# Patient Record
Sex: Female | Born: 1965 | Race: White | Hispanic: No | Marital: Married | Smoking: Heavy tobacco smoker
Health system: Southern US, Community
[De-identification: ages and names within clinical notes are randomized; demographics above are authoritative.]

## PROBLEM LIST (undated history)

## (undated) DIAGNOSIS — R0600 Dyspnea, unspecified: Secondary | ICD-10-CM

## (undated) DIAGNOSIS — F32A Depression, unspecified: Secondary | ICD-10-CM

## (undated) DIAGNOSIS — R911 Solitary pulmonary nodule: Secondary | ICD-10-CM

## (undated) DIAGNOSIS — I10 Essential (primary) hypertension: Secondary | ICD-10-CM

## (undated) DIAGNOSIS — S2239XA Fracture of one rib, unspecified side, initial encounter for closed fracture: Secondary | ICD-10-CM

## (undated) DIAGNOSIS — M1990 Unspecified osteoarthritis, unspecified site: Secondary | ICD-10-CM

## (undated) DIAGNOSIS — F419 Anxiety disorder, unspecified: Secondary | ICD-10-CM

## (undated) DIAGNOSIS — F329 Major depressive disorder, single episode, unspecified: Secondary | ICD-10-CM

## (undated) HISTORY — PX: HERNIA REPAIR: SHX51

## (undated) HISTORY — PX: APPENDECTOMY: SHX54

---

## 2003-04-09 HISTORY — PX: HERNIA REPAIR: SHX51

## 2006-11-02 ENCOUNTER — Emergency Department: Payer: Self-pay | Admitting: Emergency Medicine

## 2013-03-15 ENCOUNTER — Ambulatory Visit: Payer: Self-pay | Admitting: Nurse Practitioner

## 2013-03-15 IMAGING — CT CT CHEST-ABD-PELV W/O CM
2 of 4 series · 14 of 36 positions shown, 16 images · non-contrast
Comparison: None.

CLINICAL DATA: Right lower lobe pulmonary nodule follow-up. No
comparison available.

EXAM:
CT CHEST, ABDOMEN AND PELVIS WITHOUT CONTRAST
TECHNIQUE: Multidetector CT imaging of the chest, abdomen and pelvis was
performed following the standard protocol without IV contrast.

[Series 2: cap wo · axial · 0.61mm/px · z∈[-70,+470]mm · 11 of 122 slices shown, 13 images]
[im 7/122  mediastinal]
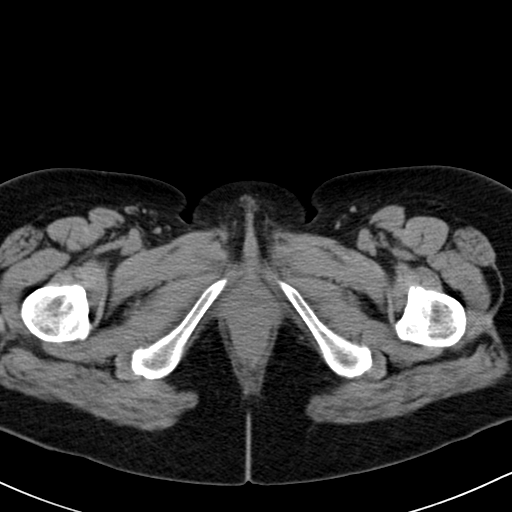
[im 7/122  bone]
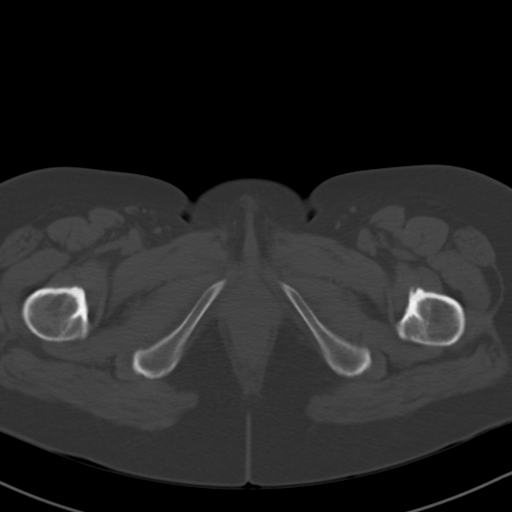
[im 21/122  mediastinal]
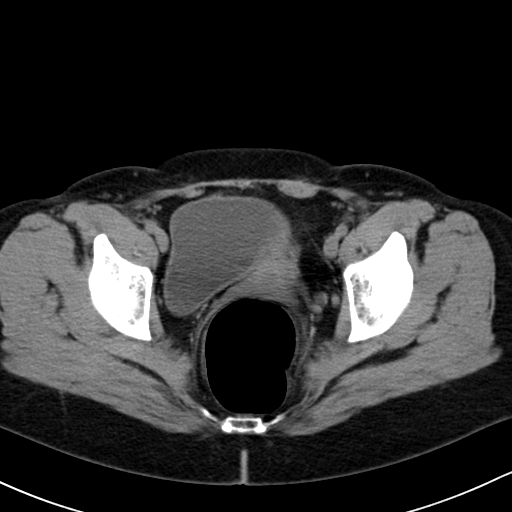
[im 27/122  mediastinal]
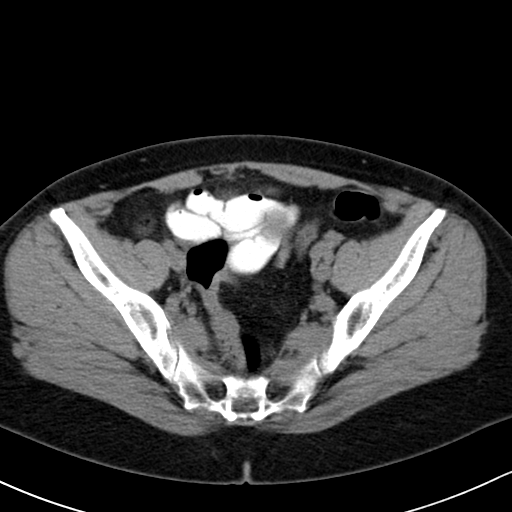
[im 41/122  mediastinal]
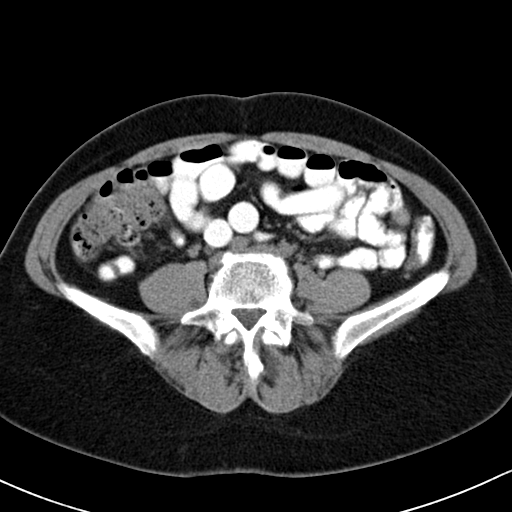
[im 48/122  mediastinal]
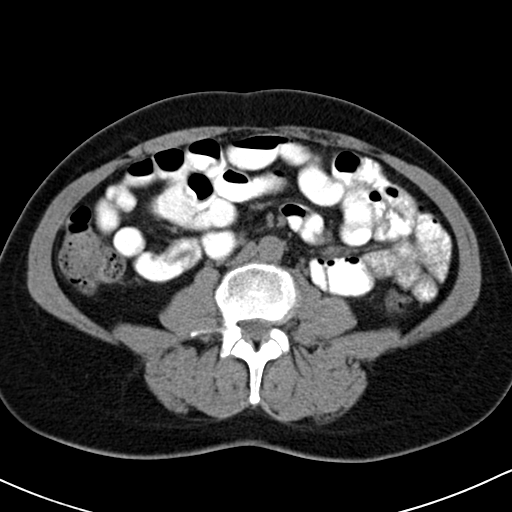
[im 61/122  mediastinal]
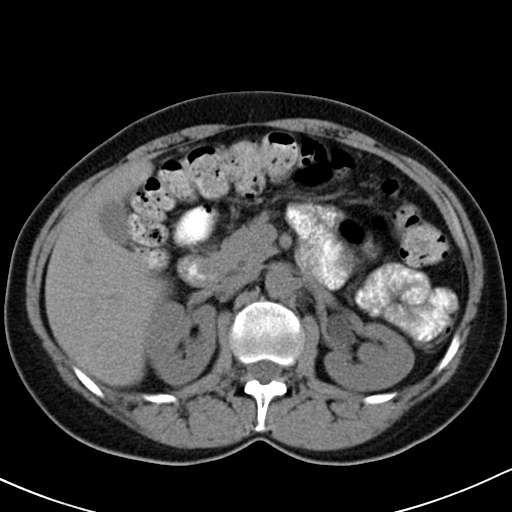
[im 74/122  mediastinal]
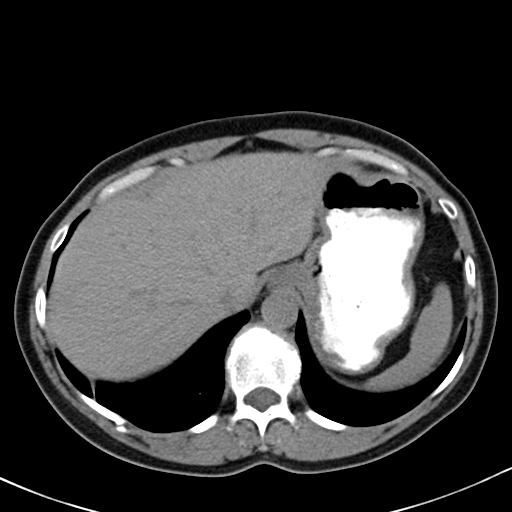
[im 81/122  mediastinal]
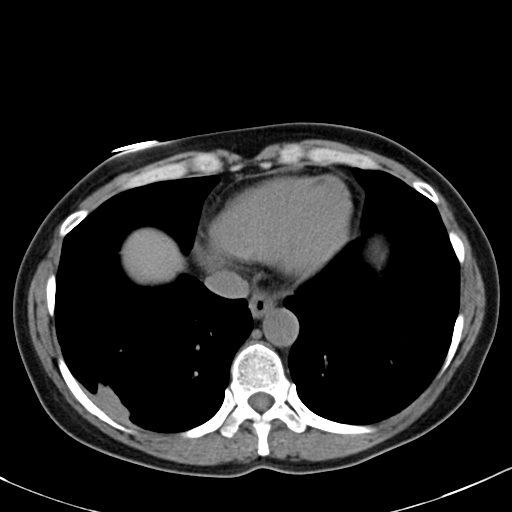
[im 95/122  mediastinal]
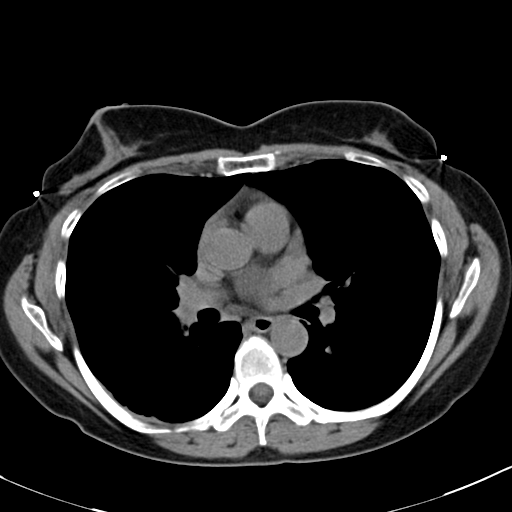
[im 95/122  bone]
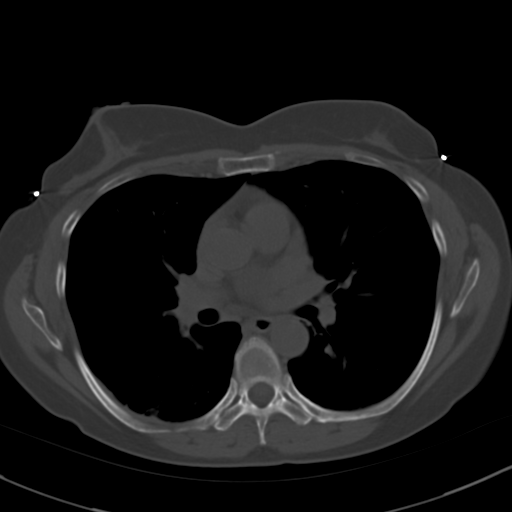
[im 101/122  mediastinal]
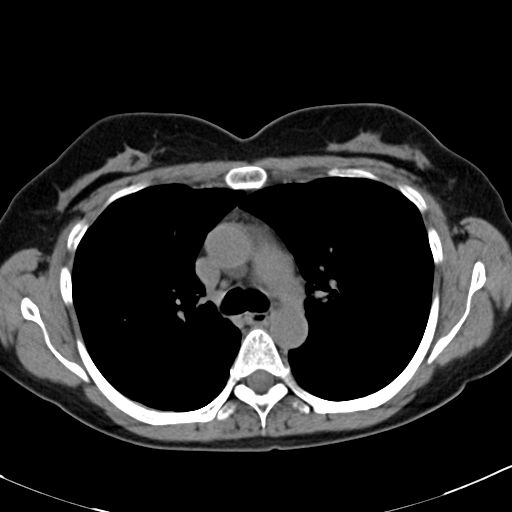
[im 115/122  mediastinal]
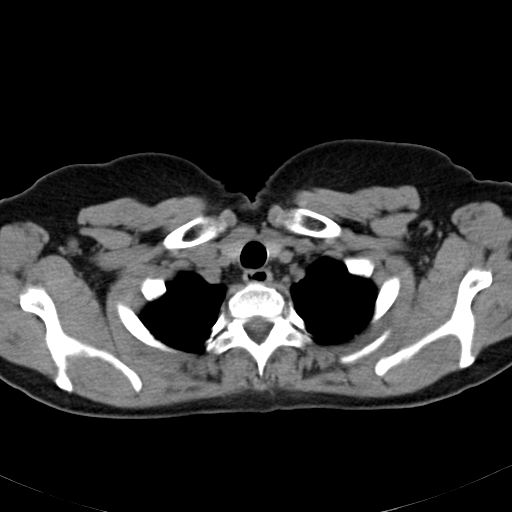

[Series 5: cor cap wo · coronal · 0.65mm/px · 3 of 114 slices shown]
[im 23/114  mediastinal]
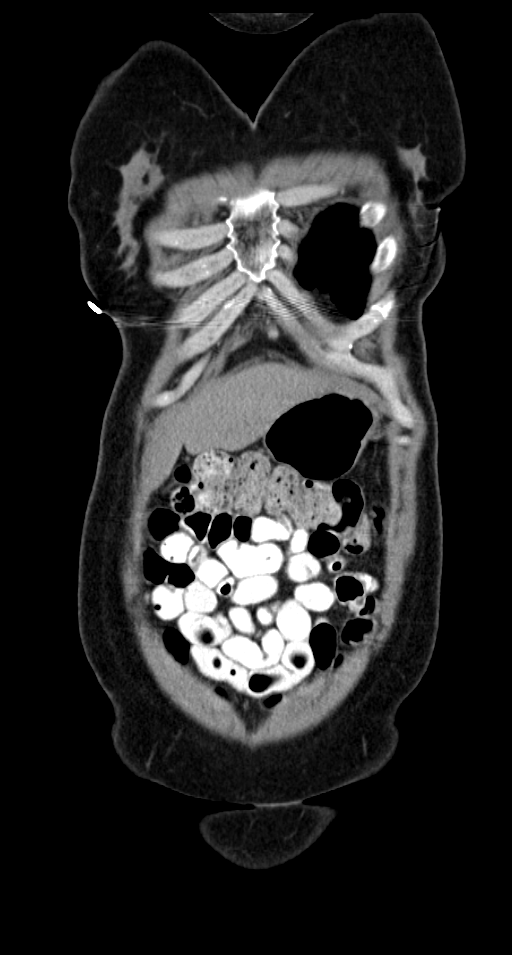
[im 46/114  mediastinal]
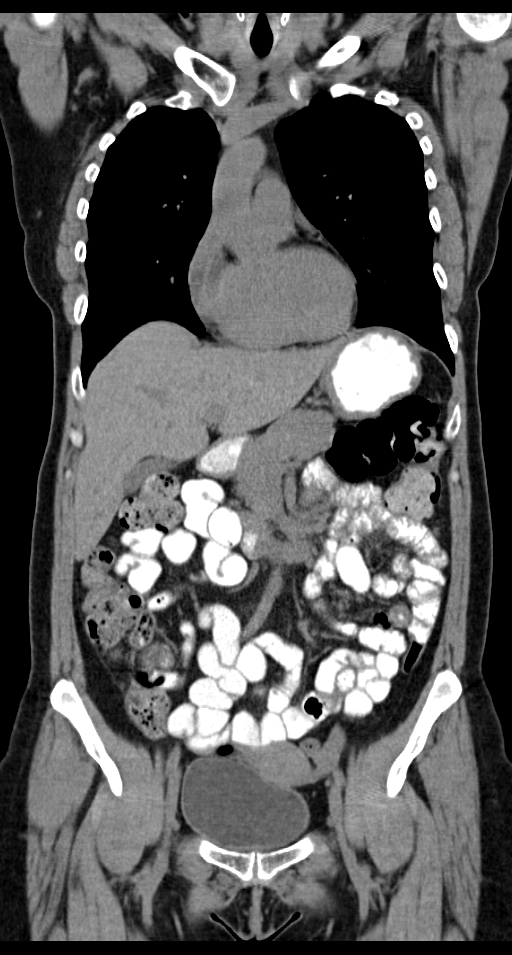
[im 68/114  mediastinal]
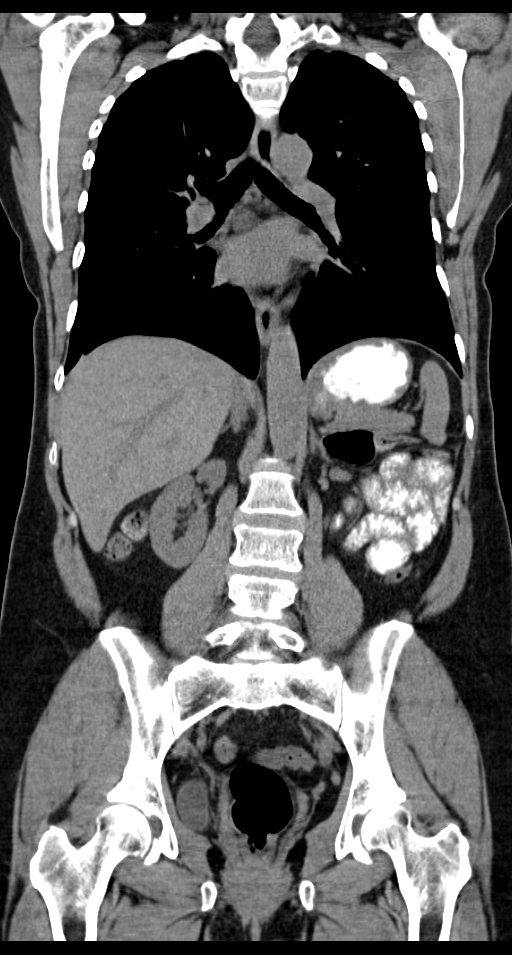

[14 of 36 positions shown; findings below may reference images not displayed]

FINDINGS: CT CHEST FINDINGS

Within the periphery of the right lateral lower lobe there is
nodular pleural parenchymal thickening measuring 2.7 x 1.0 cm. The
lesion has arc - like parenchymal bands suggesting a benign
atelectasis. Within the left upper lobe there is a flattened 6
density with which on the coronal projection is found to represent a
vessel. .

There is no axillary or supraclavicular adenopathy. No mediastinal
hilar adenopathy on this non contrast exam.

CT ABDOMEN AND PELVIS FINDINGS

No focal hepatic lesion on this noncontrast exam. Adrenal glands are
normal. The is gallbladder, pancreas, spleen, kidneys are normal.
Stomach, small bowel and cecum are normal. The colon and
rectosigmoid colon are normal.

Abdominal aorta is normal caliber. No retroperitoneal periportal
lymphadenopathy.

Uterus and bladder normal. No pelvic lymphadenopathy. No aggressive
osseous lesion.
IMPRESSION: The nodular pleural-parenchymal thickening in the right lower lobe
is most suggestive of atelectasis. No comparison available.
Depending on patient's risk factors for bronchogenic carcinoma an
the length time of prior comparison consider followup CT in 3 to 6
months. If patient is low risk no follow-up may be needed.

## 2013-03-19 ENCOUNTER — Ambulatory Visit: Payer: Self-pay | Admitting: Nurse Practitioner

## 2014-04-06 ENCOUNTER — Ambulatory Visit: Payer: Self-pay | Admitting: Nurse Practitioner

## 2016-01-12 ENCOUNTER — Encounter: Payer: Self-pay | Admitting: *Deleted

## 2016-01-15 ENCOUNTER — Encounter: Payer: Self-pay | Admitting: *Deleted

## 2016-01-15 ENCOUNTER — Ambulatory Visit: Admitting: Anesthesiology

## 2016-01-15 ENCOUNTER — Ambulatory Visit
Admission: RE | Admit: 2016-01-15 | Discharge: 2016-01-15 | Disposition: A | Discharge status: Home / Self Care | Source: Ambulatory Visit | Attending: Unknown Physician Specialty | Admitting: Unknown Physician Specialty

## 2016-01-15 ENCOUNTER — Encounter
Admission: RE | Disposition: A | Discharge status: Home / Self Care | Payer: Self-pay | Source: Ambulatory Visit | Attending: Unknown Physician Specialty

## 2016-01-15 DIAGNOSIS — F419 Anxiety disorder, unspecified: Secondary | ICD-10-CM | POA: Insufficient documentation

## 2016-01-15 DIAGNOSIS — Z79899 Other long term (current) drug therapy: Secondary | ICD-10-CM | POA: Diagnosis not present

## 2016-01-15 DIAGNOSIS — Z1211 Encounter for screening for malignant neoplasm of colon: Secondary | ICD-10-CM | POA: Diagnosis not present

## 2016-01-15 DIAGNOSIS — F329 Major depressive disorder, single episode, unspecified: Secondary | ICD-10-CM | POA: Diagnosis not present

## 2016-01-15 DIAGNOSIS — I252 Old myocardial infarction: Secondary | ICD-10-CM | POA: Insufficient documentation

## 2016-01-15 DIAGNOSIS — F17210 Nicotine dependence, cigarettes, uncomplicated: Secondary | ICD-10-CM | POA: Diagnosis not present

## 2016-01-15 DIAGNOSIS — K635 Polyp of colon: Secondary | ICD-10-CM | POA: Diagnosis not present

## 2016-01-15 DIAGNOSIS — K640 First degree hemorrhoids: Secondary | ICD-10-CM | POA: Diagnosis not present

## 2016-01-15 DIAGNOSIS — M069 Rheumatoid arthritis, unspecified: Secondary | ICD-10-CM | POA: Insufficient documentation

## 2016-01-15 HISTORY — DX: Solitary pulmonary nodule: R91.1

## 2016-01-15 HISTORY — DX: Unspecified osteoarthritis, unspecified site: M19.90

## 2016-01-15 HISTORY — PX: COLONOSCOPY WITH PROPOFOL: SHX5780

## 2016-01-15 HISTORY — DX: Depression, unspecified: F32.A

## 2016-01-15 HISTORY — DX: Major depressive disorder, single episode, unspecified: F32.9

## 2016-01-15 HISTORY — DX: Fracture of one rib, unspecified side, initial encounter for closed fracture: S22.39XA

## 2016-01-15 HISTORY — DX: Anxiety disorder, unspecified: F41.9

## 2016-01-15 SURGERY — COLONOSCOPY WITH PROPOFOL
Anesthesia: General

## 2016-01-15 MED ORDER — SODIUM CHLORIDE 0.9 % IV SOLN
INTRAVENOUS | Status: DC
  Administered 2016-01-15: 1000 mL via INTRAVENOUS

## 2016-01-15 MED ORDER — SODIUM CHLORIDE 0.9 % IV SOLN: INTRAVENOUS | Status: DC

## 2016-01-15 MED ORDER — PROPOFOL 500 MG/50ML IV EMUL
INTRAVENOUS | Status: DC | PRN
  Administered 2016-01-15: 140 ug/kg/min via INTRAVENOUS

## 2016-01-15 MED ORDER — LIDOCAINE HCL (CARDIAC) 20 MG/ML IV SOLN
INTRAVENOUS | Status: DC | PRN
  Administered 2016-01-15: 30 mg via INTRAVENOUS

## 2016-01-15 MED ORDER — PROPOFOL 10 MG/ML IV BOLUS
INTRAVENOUS | Status: DC | PRN
  Administered 2016-01-15: 20 mg via INTRAVENOUS

## 2016-01-15 MED ORDER — MIDAZOLAM HCL 2 MG/2ML IJ SOLN
INTRAMUSCULAR | Status: DC | PRN
  Administered 2016-01-15: 1 mg via INTRAVENOUS

## 2016-01-15 NOTE — Anesthesia Postprocedure Evaluation (Signed)
Anesthesia Post Note  Patient: Jillian Ward  Procedure(s) Performed: Procedure(s) (LRB): COLONOSCOPY WITH PROPOFOL (N/A)  Patient location during evaluation: Endoscopy Anesthesia Type: General Level of consciousness: awake and alert Pain management: pain level controlled Vital Signs Assessment: post-procedure vital signs reviewed and stable Respiratory status: spontaneous breathing and respiratory function stable Cardiovascular status: stable Anesthetic complications: no    Last Vitals:  Vitals:   01/15/16 1524 01/15/16 1534  BP: 101/72 112/82  Pulse: 72 70  Resp: 13 15  Temp: 36.1 C     Last Pain:  Vitals:   01/15/16 1524  TempSrc: Tympanic                 Nickholas Goldston K

## 2016-01-15 NOTE — Transfer of Care (Signed)
Immediate Anesthesia Transfer of Care Note  Patient: Jillian Ward  Procedure(s) Performed: Procedure(s): COLONOSCOPY WITH PROPOFOL (N/A)  Patient Location: PACU  Anesthesia Type:General  Level of Consciousness: awake and alert   Airway & Oxygen Therapy: Patient Spontanous Breathing and Patient connected to nasal cannula oxygen  Post-op Assessment: Report given to RN and Post -op Vital signs reviewed and stable  Post vital signs: Reviewed and stable  Last Vitals:  Vitals:   01/15/16 1359 01/15/16 1523  BP: 137/89 101/72  Pulse: 85 71  Resp: 18 14  Temp: 37 C 36.1 C    Last Pain:  Vitals:   01/15/16 1523  TempSrc: Tympanic         Complications: No apparent anesthesia complications

## 2016-01-15 NOTE — Op Note (Signed)
[facility]astroenterology Patient Name: Jillian Ward Procedure Date: 01/15/2016 2:56 PM MRN: [id number] Account #: [id number] Date of Birth: Aug 23, 1965 Admit Type: Outpatient Age: 50 Room: [facility] ENDO ROOM 3 Gender: Female Note Status: Finalized Procedure:            Colonoscopy Indications:          Screening for colorectal malignant neoplasm Providers:            Scot Jun, MD Referring MD:         Saul Fordyce, MD Medicines:            Propofol per Anesthesia Complications:        No immediate complications. Procedure:            Pre-Anesthesia Assessment:                       - After reviewing the risks and benefits, the patient                        was deemed in satisfactory condition to undergo the                        procedure.                       After obtaining informed consent, the colonoscope was                        passed under direct vision. Throughout the procedure,                        the patient's blood pressure, pulse, and oxygen                        saturations were monitored continuously. The                        Colonoscope was introduced through the anus and                        advanced to the the cecum, identified by appendiceal                        orifice and ileocecal valve. The colonoscopy was                        performed without difficulty. The patient tolerated the                        procedure well. The quality of the bowel preparation                        was excellent. Findings:      Three sessile polyps were found in the recto-sigmoid colon, distal       descending colon and hepatic flexure. The polyps were diminutive in       size. These polyps were removed with a jumbo cold forceps. Resection and       retrieval were complete.      Internal hemorrhoids were found during endoscopy. The hemorrhoids were       small and Grade I (internal hemorrhoids  that do not prolapse).      The exam  was otherwise without abnormality. Impression:           - Three diminutive polyps at the recto-sigmoid colon,                        in the distal descending colon and at the hepatic                        flexure, removed with a jumbo cold forceps. Resected                        and retrieved.                       - Internal hemorrhoids.                       - The examination was otherwise normal. Recommendation:       - Await pathology results. Scot Jun, MD 01/15/2016 3:20:32 PM This report has been signed electronically. Number of Addenda: 0 Note Initiated On: 01/15/2016 2:56 PM Scope Withdrawal Time: 0 hours 9 minutes 21 seconds  Total Procedure Duration: 0 hours 14 minutes 27 seconds       [facility]

## 2016-01-15 NOTE — Anesthesia Preprocedure Evaluation (Signed)
Anesthesia Evaluation  Patient identified by MRN, date of birth, ID band Patient awake    Reviewed: Allergy & Precautions, NPO status , Patient's Chart, lab work & pertinent test results  History of Anesthesia Complications Negative for: history of anesthetic complications  Airway Mallampati: II  TM Distance: >3 FB Neck ROM: Full    Dental no notable dental hx.    Pulmonary neg sleep apnea, neg COPD, Current Smoker,    breath sounds clear to auscultation- rhonchi (-) wheezing      Cardiovascular Exercise Tolerance: Good (-) hypertension(-) CAD and (-) Past MI  Rhythm:Regular Rate:Normal - Systolic murmurs and - Diastolic murmurs    Neuro/Psych Anxiety Depression negative neurological ROS     GI/Hepatic negative GI ROS, Neg liver ROS,   Endo/Other  negative endocrine ROSneg diabetes  Renal/GU negative Renal ROS     Musculoskeletal  (+) Arthritis , Rheumatoid disorders,    Abdominal (+) - obese,   Peds  Hematology negative hematology ROS (+)   Anesthesia Other Findings Past Medical History: No date: Anxiety No date: Arthritis     Comment: rheumatoid No date: Depression No date: Nodule of right lung No date: Rib fracture   Reproductive/Obstetrics                             Anesthesia Physical Anesthesia Plan  ASA: II  Anesthesia Plan: General   Post-op Pain Management:    Induction: Intravenous  Airway Management Planned: Natural Airway  Additional Equipment:   Intra-op Plan:   Post-operative Plan:   Informed Consent: I have reviewed the patients History and Physical, chart, labs and discussed the procedure including the risks, benefits and alternatives for the proposed anesthesia with the patient or authorized representative who has indicated his/her understanding and acceptance.   Dental advisory given  Plan Discussed with: CRNA and Anesthesiologist  Anesthesia Plan  Comments:         Anesthesia Quick Evaluation

## 2016-01-15 NOTE — Anesthesia Procedure Notes (Signed)
Date/Time: 01/15/2016 3:02 PM Performed by: Ginger Carne Pre-anesthesia Checklist: Patient identified, Emergency Drugs available, Suction available, Patient being monitored and Timeout performed Oxygen Delivery Method: Nasal cannula

## 2016-01-15 NOTE — H&P (Signed)
   Primary Care Physician:  PROVIDER NOT IN SYSTEM Primary Gastroenterologist:  Dr. Mechele Collin  Pre-Procedure History & Physical: HPI:  Jillian Ward is a 50 y.o. female is here for an colonoscopy.   Past Medical History:  Diagnosis Date  . Anxiety   . Arthritis    rheumatoid  . Depression   . Nodule of right lung   . Rib fracture     Past Surgical History:  Procedure Laterality Date  . APPENDECTOMY    . CESAREAN SECTION    . HERNIA REPAIR  2005   ABDOMINAL  . HERNIA REPAIR     UMBILICAL    Prior to Admission medications   Medication Sig Start Date End Date Taking? Authorizing Provider  Adalimumab (HUMIRA) 40 MG/0.8ML PSKT Inject 40 mg into the skin every 14 (fourteen) days.   Yes Historical Provider, MD  buPROPion (WELLBUTRIN XL) 300 MG 24 hr tablet Take 300 mg by mouth daily.   Yes Historical Provider, MD  conjugated estrogens (PREMARIN) vaginal cream Place 1 Applicatorful vaginally daily.   Yes Historical Provider, MD  FLUoxetine (PROZAC) 20 MG tablet Take 20 mg by mouth daily.   Yes Historical Provider, MD  folic acid (FOLVITE) 0.5 MG tablet Take 0.5 mg by mouth daily.   Yes Historical Provider, MD  methotrexate (RHEUMATREX) 2.5 MG tablet Take 7.5 mg by mouth 3 (three) times a week.   Yes Historical Provider, MD  naproxen (NAPROSYN) 500 MG tablet Take 500 mg by mouth 2 (two) times daily with a meal.   Yes Historical Provider, MD  traMADol (ULTRAM) 50 MG tablet Take 50 mg by mouth every 6 (six) hours as needed.   Yes Historical Provider, MD    Allergies as of 11/24/2015  . (Not on File)    History reviewed. No pertinent family history.  Social History   Social History  . Marital status: Married    Spouse name: N/A  . Number of children: N/A  . Years of education: N/A   Occupational History  . Not on file.   Social History Main Topics  . Smoking status: Heavy Tobacco Smoker    Packs/day: 1.00    Years: 30.00    Types: Cigarettes  . Smokeless tobacco:  Never Used  . Alcohol use No  . Drug use: No  . Sexual activity: Not on file   Other Topics Concern  . Not on file   Social History Narrative  . No narrative on file    Review of Systems: See HPI, otherwise negative ROS  Physical Exam: BP 137/89   Pulse 85   Temp 98.6 F (37 C) (Tympanic)   Resp 18   Ht 5\' 1"  (1.549 m)   Wt 59 kg (130 lb)   SpO2 99%   BMI 24.56 kg/m  General:   Alert,  pleasant and cooperative in NAD Head:  Normocephalic and atraumatic. Neck:  Supple; no masses or thyromegaly. Lungs:  Clear throughout to auscultation.    Heart:  Regular rate and rhythm. Abdomen:  Soft, nontender and nondistended. Normal bowel sounds, without guarding, and without rebound.   Neurologic:  Alert and  oriented x4;  grossly normal neurologically.  Impression/Plan: Jillian Ward is here for an colonoscopy to be performed for screening  Risks, benefits, limitations, and alternatives regarding  colonoscopy have been reviewed with the patient.  Questions have been answered.  All parties agreeable.   Iran Ouch, MD  01/15/2016, 2:55 PM

## 2016-01-16 ENCOUNTER — Encounter: Payer: Self-pay | Admitting: Unknown Physician Specialty

## 2016-01-17 LAB — SURGICAL PATHOLOGY

## 2019-10-21 ENCOUNTER — Other Ambulatory Visit: Payer: Self-pay | Admitting: Surgery

## 2019-10-27 ENCOUNTER — Encounter
Admission: RE | Admit: 2019-10-27 | Discharge: 2019-10-27 | Disposition: A | Discharge status: Home / Self Care | Source: Ambulatory Visit | Attending: Surgery | Admitting: Surgery

## 2019-10-27 ENCOUNTER — Other Ambulatory Visit: Payer: Self-pay

## 2019-10-27 HISTORY — DX: Essential (primary) hypertension: I10

## 2019-10-27 NOTE — Patient Instructions (Signed)
Your procedure is scheduled on: Wednesday November 03, 2019. Report to Day Surgery inside [facility] 2nd floor. To find out your arrival time please call [phone] between 1PM - 3PM on Tuesday November 02, 2019.  Remember: Instructions that are not followed completely may result in serious medical risk,  up to and including death, or upon the discretion of your surgeon and anesthesiologist your  surgery may need to be rescheduled.     _X__ 1. Do not eat food after midnight the night before your procedure.                 No gum chewing or hard candies. You may drink clear liquids up to 2 hours                 before you are scheduled to arrive for your surgery- DO not drink clear                 liquids within 2 hours of the start of your surgery.                 Clear Liquids include:  water, apple juice without pulp, clear Gatorade, G2 or                  Gatorade Zero (avoid Red/Purple/Blue), Black Coffee or Tea (Do not add                 anything to coffee or tea).  __X__2. Complete the carbohydrate drink provided to you, 2 hours before arrival.  __X__3.  On the morning of surgery brush your teeth with toothpaste and water, you                may rinse your mouth with mouthwash if you wish.  Do not swallow any toothpaste of mouthwash.     _X__ 4.  No Alcohol for 24 hours before or after surgery.   _X__ 5.  Do Not Smoke or use e-cigarettes For 24 Hours Prior to Your Surgery.                 Do not use any chewable tobacco products for at least 6 hours prior to                 Surgery.  _X__  6.  Do not use any recreational drugs (marijuana, cocaine, heroin, ecstacy, MDMA or other)                For at least one week prior to your surgery.  Combination of these drugs with anesthesia                May have life threatening results.  __X__  7. Notify your doctor if there is any change in your medical condition      (cold, fever, infections).     Do not wear  jewelry, make-up, hairpins, clips or nail polish. Do not wear lotions, powders, or perfumes. You may wear deodorant. Do not shave 48 hours prior to surgery. Men may shave face and neck. Do not bring valuables to the hospital.    [facility] is not responsible for any belongings or valuables.  Contacts, dentures or bridgework may not be worn into surgery. Leave your suitcase in the car. After surgery it may be brought to your room. For patients admitted to the hospital, discharge time is determined by your treatment team.   Patients discharged the day of surgery will not  be allowed to drive home.   Make arrangements for someone to be with you for the first 24 hours of your Same Day Discharge.    ____ Take these medicines the morning of surgery with A SIP OF WATER:    1. None  (Since you take your medications in the afternoon)  __X__ Use CHG Soap as directed on sheet  __X__ Stop Anti-inflammatories such as Ibuprofen, Aleve, Advil, naproxen, aspirin and or BC powders.   __X__ Stop supplements until after surgery.  folic acid (FOLVITE) 1 MG  __X__ Do not start any herbal supplements before your surgery.

## 2019-11-01 ENCOUNTER — Other Ambulatory Visit: Payer: Self-pay

## 2019-11-01 ENCOUNTER — Encounter
Admission: RE | Admit: 2019-11-01 | Discharge: 2019-11-01 | Disposition: A | Discharge status: Home / Self Care | Source: Ambulatory Visit | Attending: Surgery | Admitting: Surgery

## 2019-11-01 ENCOUNTER — Other Ambulatory Visit

## 2019-11-01 DIAGNOSIS — Z01818 Encounter for other preprocedural examination: Secondary | ICD-10-CM | POA: Insufficient documentation

## 2019-11-01 DIAGNOSIS — Z20822 Contact with and (suspected) exposure to covid-19: Secondary | ICD-10-CM | POA: Diagnosis not present

## 2019-11-02 LAB — SARS CORONAVIRUS 2 (TAT 6-24 HRS): SARS Coronavirus 2: NEGATIVE

## 2019-11-03 ENCOUNTER — Other Ambulatory Visit: Payer: Self-pay

## 2019-11-03 ENCOUNTER — Ambulatory Visit
Admission: RE | Admit: 2019-11-03 | Discharge: 2019-11-03 | Disposition: A | Discharge status: Home / Self Care | Attending: Surgery | Admitting: Surgery

## 2019-11-03 ENCOUNTER — Encounter: Payer: Self-pay | Admitting: Surgery

## 2019-11-03 ENCOUNTER — Ambulatory Visit

## 2019-11-03 ENCOUNTER — Encounter
Admission: RE | Disposition: A | Discharge status: Home / Self Care | Payer: Self-pay | Source: Home / Self Care | Attending: Surgery

## 2019-11-03 DIAGNOSIS — F329 Major depressive disorder, single episode, unspecified: Secondary | ICD-10-CM | POA: Diagnosis not present

## 2019-11-03 DIAGNOSIS — I10 Essential (primary) hypertension: Secondary | ICD-10-CM | POA: Insufficient documentation

## 2019-11-03 DIAGNOSIS — M720 Palmar fascial fibromatosis [Dupuytren]: Secondary | ICD-10-CM | POA: Diagnosis not present

## 2019-11-03 DIAGNOSIS — F419 Anxiety disorder, unspecified: Secondary | ICD-10-CM | POA: Diagnosis not present

## 2019-11-03 DIAGNOSIS — Z79899 Other long term (current) drug therapy: Secondary | ICD-10-CM | POA: Insufficient documentation

## 2019-11-03 DIAGNOSIS — M069 Rheumatoid arthritis, unspecified: Secondary | ICD-10-CM | POA: Diagnosis not present

## 2019-11-03 HISTORY — PX: DUPUYTREN CONTRACTURE RELEASE: SHX1478

## 2019-11-03 SURGERY — RELEASE, DUPUYTREN CONTRACTURE
Anesthesia: General | Site: Finger | Laterality: Left

## 2019-11-03 MED ORDER — ONDANSETRON HCL 4 MG PO TABS: 4.0000 mg | ORAL_TABLET | Freq: Four times a day (QID) | ORAL | Status: DC | PRN

## 2019-11-03 MED ORDER — BUPIVACAINE HCL (PF) 0.5 % IJ SOLN
INTRAMUSCULAR | Status: AC
  Filled 2019-11-03: qty 30

## 2019-11-03 MED ORDER — CLINDAMYCIN PHOSPHATE 900 MG/50ML IV SOLN
INTRAVENOUS | Status: AC
  Filled 2019-11-03: qty 50

## 2019-11-03 MED ORDER — CHLORHEXIDINE GLUCONATE 0.12 % MT SOLN
OROMUCOSAL | Status: AC
  Administered 2019-11-03: 15 mL via OROMUCOSAL
  Filled 2019-11-03: qty 15

## 2019-11-03 MED ORDER — FAMOTIDINE 20 MG PO TABS
ORAL_TABLET | ORAL | Status: AC
  Administered 2019-11-03: 20 mg via ORAL
  Filled 2019-11-03: qty 1

## 2019-11-03 MED ORDER — PROPOFOL 10 MG/ML IV BOLUS
INTRAVENOUS | Status: DC | PRN
  Administered 2019-11-03: 30 mg via INTRAVENOUS
  Administered 2019-11-03: 125 mg via INTRAVENOUS

## 2019-11-03 MED ORDER — ONDANSETRON HCL 4 MG/2ML IJ SOLN: 4.0000 mg | Freq: Once | INTRAMUSCULAR | Status: DC | PRN

## 2019-11-03 MED ORDER — FENTANYL CITRATE (PF) 100 MCG/2ML IJ SOLN
25.0000 ug | INTRAMUSCULAR | Status: DC | PRN
  Administered 2019-11-03 (×4): 25 ug via INTRAVENOUS

## 2019-11-03 MED ORDER — LACTATED RINGERS IV SOLN: INTRAVENOUS | Status: DC

## 2019-11-03 MED ORDER — LIDOCAINE HCL (PF) 2 % IJ SOLN
INTRAMUSCULAR | Status: AC
  Filled 2019-11-03: qty 10

## 2019-11-03 MED ORDER — LIDOCAINE HCL (CARDIAC) PF 100 MG/5ML IV SOSY
PREFILLED_SYRINGE | INTRAVENOUS | Status: DC | PRN
  Administered 2019-11-03: 80 mg via INTRAVENOUS

## 2019-11-03 MED ORDER — CLINDAMYCIN PHOSPHATE 900 MG/50ML IV SOLN
900.0000 mg | INTRAVENOUS | Status: AC
  Administered 2019-11-03: 900 mg via INTRAVENOUS

## 2019-11-03 MED ORDER — FENTANYL CITRATE (PF) 100 MCG/2ML IJ SOLN
INTRAMUSCULAR | Status: DC | PRN
  Administered 2019-11-03: 25 ug via INTRAVENOUS
  Administered 2019-11-03: 12.5 ug via INTRAVENOUS
  Administered 2019-11-03: 25 ug via INTRAVENOUS
  Administered 2019-11-03 (×3): 12.5 ug via INTRAVENOUS

## 2019-11-03 MED ORDER — FAMOTIDINE 20 MG PO TABS: 20.0000 mg | ORAL_TABLET | Freq: Once | ORAL | Status: AC

## 2019-11-03 MED ORDER — HYDROCODONE-ACETAMINOPHEN 5-325 MG PO TABS: 1.0000 | ORAL_TABLET | ORAL | Status: DC | PRN

## 2019-11-03 MED ORDER — BUPIVACAINE HCL (PF) 0.5 % IJ SOLN
INTRAMUSCULAR | Status: DC | PRN
  Administered 2019-11-03: 15 mL

## 2019-11-03 MED ORDER — ONDANSETRON HCL 4 MG/2ML IJ SOLN
INTRAMUSCULAR | Status: DC | PRN
  Administered 2019-11-03: 4 mg via INTRAVENOUS

## 2019-11-03 MED ORDER — MIDAZOLAM HCL 2 MG/2ML IJ SOLN
INTRAMUSCULAR | Status: AC
  Filled 2019-11-03: qty 2

## 2019-11-03 MED ORDER — METOCLOPRAMIDE HCL 5 MG/ML IJ SOLN: 5.0000 mg | Freq: Three times a day (TID) | INTRAMUSCULAR | Status: DC | PRN

## 2019-11-03 MED ORDER — DEXAMETHASONE SODIUM PHOSPHATE 10 MG/ML IJ SOLN
INTRAMUSCULAR | Status: DC | PRN
  Administered 2019-11-03: 5 mg via INTRAVENOUS

## 2019-11-03 MED ORDER — CHLORHEXIDINE GLUCONATE 0.12 % MT SOLN: 15.0000 mL | Freq: Once | OROMUCOSAL | Status: AC

## 2019-11-03 MED ORDER — FENTANYL CITRATE (PF) 100 MCG/2ML IJ SOLN
INTRAMUSCULAR | Status: AC
  Filled 2019-11-03: qty 2

## 2019-11-03 MED ORDER — ONDANSETRON HCL 4 MG/2ML IJ SOLN: 4.0000 mg | Freq: Four times a day (QID) | INTRAMUSCULAR | Status: DC | PRN

## 2019-11-03 MED ORDER — PHENYLEPHRINE HCL (PRESSORS) 10 MG/ML IV SOLN
INTRAVENOUS | Status: AC
  Filled 2019-11-03: qty 1

## 2019-11-03 MED ORDER — DEXAMETHASONE SODIUM PHOSPHATE 10 MG/ML IJ SOLN
INTRAMUSCULAR | Status: AC
  Filled 2019-11-03: qty 1

## 2019-11-03 MED ORDER — MIDAZOLAM HCL 2 MG/2ML IJ SOLN
INTRAMUSCULAR | Status: DC | PRN
  Administered 2019-11-03: 2 mg via INTRAVENOUS

## 2019-11-03 MED ORDER — PROPOFOL 500 MG/50ML IV EMUL
INTRAVENOUS | Status: AC
  Filled 2019-11-03: qty 50

## 2019-11-03 MED ORDER — CLINDAMYCIN PHOSPHATE 600 MG/50ML IV SOLN
INTRAVENOUS | Status: AC
  Filled 2019-11-03: qty 50

## 2019-11-03 MED ORDER — METOCLOPRAMIDE HCL 10 MG PO TABS: 5.0000 mg | ORAL_TABLET | Freq: Three times a day (TID) | ORAL | Status: DC | PRN

## 2019-11-03 MED ORDER — PHENYLEPHRINE HCL (PRESSORS) 10 MG/ML IV SOLN
INTRAVENOUS | Status: DC | PRN
  Administered 2019-11-03 (×2): 50 ug via INTRAVENOUS
  Administered 2019-11-03: 150 ug via INTRAVENOUS
  Administered 2019-11-03: 50 ug via INTRAVENOUS
  Administered 2019-11-03: 150 ug via INTRAVENOUS
  Administered 2019-11-03 (×2): 100 ug via INTRAVENOUS
  Administered 2019-11-03: 50 ug via INTRAVENOUS
  Administered 2019-11-03 (×3): 100 ug via INTRAVENOUS

## 2019-11-03 MED ORDER — ONDANSETRON HCL 4 MG/2ML IJ SOLN
INTRAMUSCULAR | Status: AC
  Filled 2019-11-03: qty 2

## 2019-11-03 MED ORDER — HYDROCODONE-ACETAMINOPHEN 5-325 MG PO TABS: 1.0000 | ORAL_TABLET | Freq: Four times a day (QID) | ORAL | 0 refills | Status: DC | PRN

## 2019-11-03 MED ORDER — ORAL CARE MOUTH RINSE: 15.0000 mL | Freq: Once | OROMUCOSAL | Status: AC

## 2019-11-03 SURGICAL SUPPLY — 39 items
BLADE SURG 15 STRL LF DISP TIS (BLADE) ×1 IMPLANT
BLADE SURG 15 STRL SS (BLADE) ×2
BNDG COHESIVE 4X5 TAN STRL (GAUZE/BANDAGES/DRESSINGS) ×3 IMPLANT
BNDG CONFORM 2 STRL LF (GAUZE/BANDAGES/DRESSINGS) ×3 IMPLANT
BNDG ELASTIC 2X5.8 VLCR STR LF (GAUZE/BANDAGES/DRESSINGS) ×3 IMPLANT
BNDG ELASTIC 3X5.8 VLCR STR LF (GAUZE/BANDAGES/DRESSINGS) ×6 IMPLANT
BNDG ESMARK 4X12 TAN STRL LF (GAUZE/BANDAGES/DRESSINGS) ×3 IMPLANT
CHLORAPREP W/TINT 26 (MISCELLANEOUS) ×3 IMPLANT
CORD BIP STRL DISP 12FT (MISCELLANEOUS) ×3 IMPLANT
COVER WAND RF STERILE (DRAPES) ×3 IMPLANT
CUFF TOURN SGL QUICK 18X4 (TOURNIQUET CUFF) ×3 IMPLANT
DRAPE SPLIT 6X30 W/TAPE (DRAPES) ×3 IMPLANT
DRAPE SURG 17X11 SM STRL (DRAPES) ×3 IMPLANT
ELECT REM PT RETURN 9FT ADLT (ELECTROSURGICAL) ×3
ELECTRODE REM PT RTRN 9FT ADLT (ELECTROSURGICAL) ×1 IMPLANT
FORCEPS JEWEL BIP 4-3/4 STR (INSTRUMENTS) ×3 IMPLANT
GAUZE SPONGE 4X4 12PLY STRL (GAUZE/BANDAGES/DRESSINGS) ×3 IMPLANT
GAUZE XEROFORM 1X8 LF (GAUZE/BANDAGES/DRESSINGS) ×3 IMPLANT
GLOVE BIO SURGEON STRL SZ8 (GLOVE) ×6 IMPLANT
GLOVE INDICATOR 8.0 STRL GRN (GLOVE) ×3 IMPLANT
GOWN STRL REUS W/ TWL LRG LVL3 (GOWN DISPOSABLE) ×1 IMPLANT
GOWN STRL REUS W/ TWL XL LVL3 (GOWN DISPOSABLE) ×1 IMPLANT
GOWN STRL REUS W/TWL LRG LVL3 (GOWN DISPOSABLE) ×2
GOWN STRL REUS W/TWL XL LVL3 (GOWN DISPOSABLE) ×2
KIT TURNOVER KIT A (KITS) ×3 IMPLANT
NS IRRIG 1000ML POUR BTL (IV SOLUTION) ×3 IMPLANT
NS IRRIG 500ML POUR BTL (IV SOLUTION) ×3 IMPLANT
PACK EXTREMITY (MISCELLANEOUS) ×3 IMPLANT
PAD PREP 24X41 OB/GYN DISP (PERSONAL CARE ITEMS) ×3 IMPLANT
PADDING CAST 3IN STRL (MISCELLANEOUS) ×2
PADDING CAST BLEND 3X4 STRL (MISCELLANEOUS) ×1 IMPLANT
SPLINT CAST 1 STEP 3X12 (MISCELLANEOUS) ×3 IMPLANT
SPONGE GAUZE 2X2 8PLY STER LF (GAUZE/BANDAGES/DRESSINGS) ×1
SPONGE GAUZE 2X2 8PLY STRL LF (GAUZE/BANDAGES/DRESSINGS) ×2 IMPLANT
STOCKINETTE IMPERVIOUS 9X36 MD (GAUZE/BANDAGES/DRESSINGS) ×3 IMPLANT
STOCKINETTE STRL 4IN 9604848 (GAUZE/BANDAGES/DRESSINGS) ×3 IMPLANT
SUT PROLENE 4 0 PS 2 18 (SUTURE) ×3 IMPLANT
SUT VIC AB 3-0 SH 27 (SUTURE) ×2
SUT VIC AB 3-0 SH 27X BRD (SUTURE) ×1 IMPLANT

## 2019-11-03 NOTE — Op Note (Signed)
11/03/2019  10:02 AM  Patient:   Jillian Ward  Pre-Op Diagnosis:   Dupuytren's contractures, left ring and little fingers.  Post-Op Diagnosis:   Same with contracture of left little PIP joint.  Procedure:   Release of Dupuytren's contractures, left ring and little fingers with release of volar plate of the left little PIP joint.  Surgeon:   Maryagnes Amos, MD  Assistant:   Volanda Napoleon, PA-S  Anesthesia:   General LMA  Findings:   As above.  Complications:   None  EBL:   1 cc  Fluids:   650 cc crystalloid  TT:   105 minutes at 250 mmHg  Drains:   None  Closure:   4-0 Prolene interrupted sutures  Brief Clinical Note:   The patient is a 54 year old female with a history of progressively worsening contractures of the ring and little fingers of her left hand. The patient's history and examination are consistent with a Dupuytren's contractures of the left ring and little fingers. The patient presents at this time for release of the Dupuytren's contracture of the left ring and little fingers.  Procedure:   The patient was brought into the operating room and lain in the supine position. After adequate general laryngeal mask anesthesia was achieved, the left hand and upper extremity were prepped with ChloraPrep solution before being draped sterilely. Preoperative antibiotics were administered. After performing a timeout to verify the appropriate surgical site, a Lorrene Reid type zigzag incision was made along the volar aspect of the left ring and little fingers beginning near the thenar crease and extending to the PIP flexion crease of the left ring finger and nearly to the DIP flexion crease of the left little finger. The incision was carried down through subcutaneous tissues. The fibrous cord was identified and carefully dissected out from proximal to distal after releasing it proximally. As dissection was carried out along the volar aspect of the ring finger, care was taken to  identify and protect the common digital nerve and artery on either side of the cord, as well as the underlying flexor tendon, proximally. More distally, the digital neurovascular bundles were identified and protected. After the mass was removed in its entirety, the adequacy of excision was verified by palpation as well as visually. After excision of the Dupuytren's tissue, the ring finger MCP and PIP joints could be extended fully.  This process was repeated for the left little finger, following the fibrotic cord from proximal to distal while protecting the adjacent neurovascular structures. After the mass was removed in its entirety, the adequacy of excision was verified by palpation as well as visually. After excision of the Dupuytren's tissue, the little finger MCP joint could be extended fully, but the PIP joint still lacked approximately 45 degrees of extension. Therefore, it was elected to perform a volar plate release in order to try to optimize the PIP extension. This was performed by opening the flexor sheath over the PIP joint and mobilizing the flexor tendons first radially and then ulnarly to release the ulnar and radial slips of the volar plate respectively while protecting the vascular supply to the tendon. After these releases, the PIP joint could be extended passively to within 10 degrees of full extension.  The wound was copiously irrigated with sterile saline solution before the skin was reapproximated using 4-0 Prolene interrupted sutures. A total of 15 cc of 0.25% plain Sensorcaine was injected in and around the incision to help with postoperative analgesia before a sterile  bulky dressing and volar splint extending to the fingertips was applied, maintaining the MCP in extension and PIP joints in as much extension as possible. The patient was then awakened, extubated, and returned to the recovery room in satisfactory condition after tolerating the procedure well.

## 2019-11-03 NOTE — Anesthesia Postprocedure Evaluation (Signed)
Anesthesia Post Note  Patient: Jillian Ward  Procedure(s) Performed: 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. (Left Finger)  Patient location during evaluation: PACU Anesthesia Type: General Level of consciousness: awake and alert and oriented Pain management: pain level controlled Vital Signs Assessment: post-procedure vital signs reviewed and stable Respiratory status: spontaneous breathing Cardiovascular status: blood pressure returned to baseline Anesthetic complications: no   No complications documented.   Last Vitals:  Vitals:   11/03/19 1039 11/03/19 1051  BP: (!) 116/89 (!) 123/64  Pulse: 90 100  Resp: 15 16  Temp: 36.7 C 36.6 C  SpO2: 95% 96%    Last Pain:  Vitals:   11/03/19 1051  TempSrc: Temporal  PainSc: 3                  Briget Shaheed

## 2019-11-03 NOTE — H&P (Signed)
History of Present Illness: Jillian Ward is a 54 y.o. female who presents today for her surgical history and physical for upcoming left hand Dupuytren's contracture release of the ring and little finger. Surgery is scheduled with Dr. Roland Rack on 11/03/2019. The patient denies any changes in her medical history since she was last evaluated. The patient denies any personal history of heart attack, stroke, asthma or COPD. No personal history of blood clots. Pain score in the left hand is a 1 out of 10. She denies any numbness or tingling to the left upper extremity. No surgical history to the left hand in the past.  Past Medical History: . Allergy  . Anxiety  . Chronic, continuous use of opioids  [facility] Contract signed 10/21/16, resigned 10/22/17; Initial UDS done 10/21/16.  . Depression  . Hypertension  . Impaired glucose tolerance 03/2013  . Nodule of right lung  Lower lobe  . Osteoporosis, post-menopausal  . Rheumatoid arthritis (CMS-HCC)  . Rib fracture  x2. Bilat. 2/2 fall at work.   Past Surgical History: . APPENDECTOMY  . CESAREAN SECTION  x2  . COLONOSCOPY 01/15/2016  Hyperplastic Polyps: CBF 01/2026  . HERNIA REPAIR 2005  Abdominal  . UMBILICAL HERNIA REPAIR   Past Family History: . Alcohol abuse Mother  . Diabetes type I Daughter  . Asthma Daughter  . Anxiety Daughter  . Depression Daughter  . Alcohol abuse Maternal Uncle  . Alcohol abuse Maternal Grandmother  . Alzheimer's disease Maternal Grandmother  . Alcohol abuse Maternal Uncle  . Alzheimer's disease Paternal Grandfather  . Anxiety Daughter  . Depression Daughter  . Multiple sclerosis Daughter  . Bipolar disorder Sister  . Depression Sister  . Glaucoma Paternal Grandmother  . Rheumatologic disease Neg Hx   Medications: . adalimumab (HUMIRA) 40 mg/0.8 mL prefilled syringe kit Inject 0.8 mLs (40 mg total) subcutaneously every 7 (seven) days 12 each 1  . amLODIPine (NORVASC) 5 MG tablet TAKE 1 TABLET  DAILY 90 tablet 1  . buPROPion (WELLBUTRIN XL) 300 MG XL tablet TAKE 1 TABLET DAILY 90 tablet 1  . methotrexate, PF, (RASUVO, PF,) 10 mg/0.2 mL AtIn Inject 10 mg subcutaneously every 7 (seven) days 2.4 mL 0  . traMADoL (ULTRAM) 50 mg tablet TAKE 1 TABLET DAILY AS NEEDED FOR PAIN 30 tablet 0  . venlafaxine (EFFEXOR-XR) 150 MG XR capsule TAKE 1 CAPSULE DAILY 90 capsule 1  . folic acid (FOLVITE) 1 MG tablet Take 1 tablet (1 mg total) by mouth once daily 90 tablet 3   No current Epic-ordered facility-administered medications on file.   Allergies: . Penicillins Hives   Review of Systems:  A comprehensive 14 point ROS was performed, reviewed by me today, and the pertinent orthopaedic findings are documented in the HPI.  Physical Exam: BP (!) 144/94  Ht 154.9 cm ('5\' 1"' )  Wt 64 kg (141 lb)  LMP (LMP Unknown)  BMI 26.64 kg/m  General/Constitutional: The patient appears to be well-nourished, well-developed, and in no acute distress. Neuro/Psych: Normal mood and affect, oriented to person, place and time. Eyes: Non-icteric. Pupils are equal, round, and reactive to light, and exhibit synchronous movement. ENT: Unremarkable. Lymphatic: No palpable adenopathy. Respiratory: Lungs clear to auscultation, Normal chest excursion, No wheezes and Non-labored breathing Cardiovascular: Regular rate and rhythm. No murmurs. and No edema, swelling or tenderness, except as noted in detailed exam. Integumentary: No impressive skin lesions present, except as noted in detailed exam. Musculoskeletal: Unremarkable, except as noted in detailed exam.  Left  hand exam:  Skin inspection of the left hand is notable for a firm cordlike structure extending from the proximal palmar crease and extending beyond the PIP flexion crease of her left ring finger. A second less developed cord is notable involving the palmar aspect of her ring finger extending from the distal palmar crease to the PIP joint. These cordlike structures  are firm but painless. She lacks 30 degrees of extension of the little MCP joint and 90 degrees of extension of the PIP joint of her little finger. She lacks approximately 15 to 20 degrees of extension of the MCP joint of the ring finger. She is able active flex and extend all remaining digits without any pain or triggering. She is neurovascularly intact to all digits.  Imaging: None.  Impression: Dupuytren's contracture of left hand.  Plan:  1. Treatment options were discussed today with the patient. 2. The patient is scheduled for a left hand Dupuytren's contracture release of the fourth and fifth digits. Surgery scheduled with Dr. Roland Rack on 11/03/2019. 3. The patient was instructed on the risk and benefits of surgery and wishes to proceed at this time. 4. This document will serve as a surgical history and physical for the patient. 5. The patient will follow-up 6 days following surgery for skin check. They can call the clinic they have any questions, new symptoms develop or symptoms worsen.  The procedure was discussed with the patient, as were the potential risks (including bleeding, infection, nerve and/or blood vessel injury, persistent or recurrent pain, failure of the debridement, progression of arthritis, need for further surgery, blood clots, strokes, heart attacks and/or arhythmias, pneumonia, etc.) and benefits. The patient states her understanding and wishes to proceed.   H&P reviewed and patient re-examined. No changes.

## 2019-11-03 NOTE — Discharge Instructions (Addendum)
AMBULATORY SURGERY  DISCHARGE INSTRUCTIONS   1) The drugs that you were given will stay in your system until tomorrow so for the next 24 hours you should not:  A) Drive an automobile B) Make any legal decisions C) Drink any alcoholic beverage   2) You may resume regular meals tomorrow.  Today it is better to start with liquids and gradually work up to solid foods.  You may eat anything you prefer, but it is better to start with liquids, then soup and crackers, and gradually work up to solid foods.   3) Please notify your doctor immediately if you have any unusual bleeding, trouble breathing, redness and pain at the surgery site, drainage, fever, or pain not relieved by medication.    4) Additional Instructions:        Please contact your physician with any problems or Same Day Surgery at [phone], Monday through Friday 6 am to 4 pm, or [place] at [facility] number at [phone].  Orthopedic discharge instructions: Keep splint dry and intact. Keep hand elevated above heart level. Apply ice to affected area frequently. Take ibuprofen 600 mg TID with meals for 7-10 days, then as necessary.   TID = three times per day (every 8 hours) Take pain medication as prescribed or ES Tylenol when needed.  Return for follow-up on 11/09/19 as scheduled.

## 2019-11-03 NOTE — Transfer of Care (Signed)
Immediate Anesthesia Transfer of Care Note  Patient: Jillian Ward  Procedure(s) Performed: 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. (Left Finger)  Patient Location: PACU  Anesthesia Type:General  Level of Consciousness: awake and patient cooperative  Airway & Oxygen Therapy: Patient Spontanous Breathing and Patient connected to face mask oxygen  Post-op Assessment: Report given to RN and Post -op Vital signs reviewed and stable  Post vital signs: Reviewed and stable  Last Vitals:  Vitals Value Taken Time  BP 120/94 11/03/19 0959  Temp    Pulse 98 11/03/19 1001  Resp 15 11/03/19 1001  SpO2 99 % 11/03/19 1001  Vitals shown include unvalidated device data.  Last Pain:  Vitals:   11/03/19 0615  TempSrc: Temporal  PainSc: 0-No pain         Complications: No complications documented.

## 2019-11-03 NOTE — Anesthesia Procedure Notes (Signed)
Procedure Name: LMA Insertion Date/Time: 11/03/2019 7:44 AM Performed by: Omer Jack, CRNA Pre-anesthesia Checklist: Patient identified, Patient being monitored, Timeout performed, Emergency Drugs available and Suction available Patient Re-evaluated:Patient Re-evaluated prior to induction Oxygen Delivery Method: Circle system utilized Preoxygenation: Pre-oxygenation with 100% oxygen Induction Type: IV induction Ventilation: Mask ventilation without difficulty LMA: LMA inserted LMA Size: 3.0 Tube type: Oral Number of attempts: 1 Placement Confirmation: positive ETCO2 and breath sounds checked- equal and bilateral Tube secured with: Tape Dental Injury: Teeth and Oropharynx as per pre-operative assessment

## 2019-11-03 NOTE — Anesthesia Preprocedure Evaluation (Signed)
Anesthesia Evaluation  Patient identified by MRN, date of birth, ID band Patient awake    Reviewed: Allergy & Precautions, NPO status , Patient's Chart, lab work & pertinent test results  History of Anesthesia Complications Negative for: history of anesthetic complications  Airway Mallampati: II  TM Distance: >3 FB Neck ROM: Full    Dental no notable dental hx.    Pulmonary neg sleep apnea, neg COPD, Current Smoker and Patient abstained from smoking.,    breath sounds clear to auscultation- rhonchi (-) wheezing      Cardiovascular Exercise Tolerance: Good hypertension, Pt. on medications (-) CAD and (-) Past MI  Rhythm:Regular Rate:Normal - Systolic murmurs and - Diastolic murmurs    Neuro/Psych PSYCHIATRIC DISORDERS Anxiety Depression negative neurological ROS     GI/Hepatic negative GI ROS, Neg liver ROS,   Endo/Other  negative endocrine ROSneg diabetes  Renal/GU negative Renal ROS  negative genitourinary   Musculoskeletal  (+) Arthritis , Rheumatoid disorders,    Abdominal (+) - obese,   Peds negative pediatric ROS (+)  Hematology negative hematology ROS (+)   Anesthesia Other Findings Past Medical History: No date: Anxiety No date: Arthritis     Comment: rheumatoid No date: Depression No date: Nodule of right lung No date: Rib fracture   Reproductive/Obstetrics                             Anesthesia Physical  Anesthesia Plan  ASA: II  Anesthesia Plan: General   Post-op Pain Management:    Induction: Intravenous  PONV Risk Score and Plan:   Airway Management Planned: LMA and Oral ETT  Additional Equipment:   Intra-op Plan:   Post-operative Plan: Extubation in OR  Informed Consent: I have reviewed the patients History and Physical, chart, labs and discussed the procedure including the risks, benefits and alternatives for the proposed anesthesia with the patient or  authorized representative who has indicated his/her understanding and acceptance.     Dental advisory given  Plan Discussed with: CRNA and Anesthesiologist  Anesthesia Plan Comments:         Anesthesia Quick Evaluation

## 2019-11-04 ENCOUNTER — Encounter: Payer: Self-pay | Admitting: Surgery

## 2019-11-04 LAB — SURGICAL PATHOLOGY

## 2020-01-04 ENCOUNTER — Other Ambulatory Visit: Payer: Self-pay

## 2020-01-04 ENCOUNTER — Encounter: Payer: Self-pay | Admitting: Occupational Therapy

## 2020-01-04 ENCOUNTER — Ambulatory Visit: Attending: Emergency Medicine | Admitting: Occupational Therapy

## 2020-01-04 DIAGNOSIS — M6249 Contracture of muscle, multiple sites: Secondary | ICD-10-CM | POA: Insufficient documentation

## 2020-01-04 DIAGNOSIS — M79642 Pain in left hand: Secondary | ICD-10-CM | POA: Diagnosis present

## 2020-01-04 DIAGNOSIS — M720 Palmar fascial fibromatosis [Dupuytren]: Secondary | ICD-10-CM | POA: Diagnosis present

## 2020-01-04 DIAGNOSIS — M25642 Stiffness of left hand, not elsewhere classified: Secondary | ICD-10-CM | POA: Insufficient documentation

## 2020-01-04 DIAGNOSIS — L905 Scar conditions and fibrosis of skin: Secondary | ICD-10-CM | POA: Diagnosis present

## 2020-01-04 NOTE — Therapy (Signed)
[facility] PH[facility] AND SP[facility]TS [facility]ICINE [address], [place], [address] Phone: [phone]   Fax:  [phone]  Occupational Therapy Evaluation  Patient Details  Name: Jillian Ward MRN: [id number] Date of Birth: 23-Nov-1965 Referring Provider (OT): Dr Joette Catching   Encounter Date: 01/04/2020   OT End of Session - 01/04/20 1446    Visit Number 1    Number of Visits 12    Date for OT Re-Evaluation 02/15/20    OT Start Time 1015    OT Stop Time 1104    OT Time Calculation (min) 49 min    Activity Tolerance Patient tolerated treatment well    Behavior During Therapy [facility] for tasks assessed/performed           Past Medical History:  Diagnosis Date  . Anxiety   . Arthritis    rheumatoid  . Depression   . Hypertension   . Nodule of right lung   . Rib fracture     Past Surgical History:  Procedure Laterality Date  . APPENDECTOMY    . CESAREAN SECTION    . COLONOSCOPY WITH PROPOFOL N/A 01/15/2016   Procedure: COLONOSCOPY WITH PROPOFOL;  Surgeon: Scot Jun, MD;  Location: [facility] ENDOSCOPY;  Service: Endoscopy;  Laterality: N/A;  . 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 HERNIA REPAIR  2005   ABDOMINAL  . HERNIA REPAIR     UMBILICAL    There were no vitals filed for this visit.   Subjective Assessment - 01/04/20 1431    Subjective  I can feel the scar tissue wants to pull my fingers into fist again - stiff and tight and scar still tender    Pertinent History Pt had L dupuytrens release on 11/03/2019 by Dr Joice Lofts - stitches come out 8/10 and then refer to OT on 12/17/2019 but was unable to get to therapy with death in family    Patient Stated Goals Don't want my fingers to curl up again , scar better and stiffness better so I can use my  hand in yard work - lover being outside    Currently in Pain? No/denies   stiffness            [facility] OT Assessment - 01/04/20 0001      Assessment   Medical Diagnosis L dupuytrens release 4th and 5th     Referring Provider (OT) Dr Joette Catching    Onset Date/Surgical Date 11/03/19    Hand Dominance Right    Next MD Visit --   20th Oct     Home  Environment   Lives With Alone      Prior Function   Vocation --   do not work because of arthritis    Leisure likes to work outside in [place] , house work , has arthritis       Strength   Right Hand Grip (lbs) 30    Right Hand Lateral Pinch 15 lbs    Right Hand 3 Point Pinch 12 lbs    Left Hand Grip (lbs) 29    Left Hand Lateral Pinch 16 lbs    Left Hand 3 Point Pinch 12 lbs      Left Hand AROM   L Ring  MCP 0-90 80 Degrees    L Ring PIP 0-100 100  Degrees    L Little  MCP 0-90 82 Degrees    L Little PIP 0-100 85 Degrees   -45                   OT Treatments/Exercises (OP) - 01/04/20 0001      RUE Paraffin   Number Minutes Paraffin 8 Minutes    RUE Paraffin Location Hand    Comments prior to PROM for PIP of 5th and soft tissue           review HEP and hand out  Scar massage and cica scar pad for night time  Silicon sleeve for 5th to use night time and daytime - for scar tissue decrease  Use LMB splint for PIP extention 3 min 3 x day after contrast and massage extention stretch for PIP extention on table  Tendon glides -blocked 1o reps         OT Education - 01/04/20 1446    Education Details findings of eval and HEP    Person(s) Educated Patient    Methods Explanation;Demonstration;Tactile cues;Verbal cues;Handout    Comprehension Verbal cues required;Returned demonstration;Verbalized understanding            OT Short Term Goals - 01/04/20 1700      OT SHORT TERM GOAL #1   Title Pt to be independent in HEP to increase AROM for flexion , extention at 5th digit , decrease scar tissue adhesion and  tenderness to increase ROM    Baseline tender , tight and thick scar - extention -45 and MC and PIP decrease flexion 5th - edema more than 1 cm    Time 2    Period Weeks    Status New    Target Date 01/18/20             OT Long Term Goals - 01/04/20 1701      OT LONG TERM GOAL #1   Title L 4th and 5th digit flexion increase to [facility] to touch palm to hold 1 cm objects and hold to stabilze knife while cutting    Baseline decrease flexion 80 and 82 at 4th and 5th , 85 flexion of PIP 5th    Time 4    Period Weeks    Status New    Target Date 02/01/20      OT LONG TERM GOAL #2   Title Extention of 5th PIP improve with more than 20 degrees to get hand with more ease in pocket and gloves    Baseline 5th PIP extention -45 degrees    Time 6    Period Weeks    Status New    Target Date 02/15/20      OT LONG TERM GOAL #3   Title L grip strength increase with 4 lbs to hold and grip tools for garden with no pain    Baseline R hand 30 , L 29 - tenderness with pressure and massage ,gripping tools    Time 6    Period Weeks    Status New    Target Date 02/15/20                 Plan - 01/04/20 1447    Clinical Impression Statement Pt is 8 1/2 wks s/p L dupuytrens contracture release on 4th and 5th- pt arrive with scar adhesions thick and tender still, increase edema in 5th digit - PIP flexion contracture of -45 at 5th , decrease flexion and gripping - limiting her  functional use of L hand in ADL's and IADL's    OT Occupational Profile and History Problem Focused Assessment - Including review of records relating to presenting problem    Occupational performance deficits (Please refer to evaluation for details): ADL's;IADL's;Play;Leisure;Social Participation    Body Structure / Function / Physical Skills ADL;Coordination;Edema;Decreased knowledge of precautions;Flexibility;IADL;Pain;Strength;Scar mobility;ROM;UE functional use    Rehab Potential Good    Clinical Decision Making Limited  treatment options, no task modification necessary    Comorbidities Affecting Occupational Performance: None    Modification or Assistance to Complete Evaluation  No modification of tasks or assist necessary to complete eval    OT Frequency 2x / week    OT Duration 6 weeks    OT Treatment/Interventions Self-care/ADL training;Moist Heat;Paraffin;Fluidtherapy;Contrast Bath;Therapeutic exercise;Manual Therapy;Passive range of motion;Scar mobilization;Splinting;Patient/family education    Plan assess progres with HEP    OT Home Exercise Plan see pt instruction    Consulted and Agree with Plan of Care Patient           Patient will benefit from skilled therapeutic intervention in order to improve the following deficits and impairments:   Body Structure / Function / Physical Skills: ADL, Coordination, Edema, Decreased knowledge of precautions, Flexibility, IADL, Pain, Strength, Scar mobility, ROM, UE functional use       Visit Diagnosis: Scar condition and fibrosis of skin - Plan: Ot plan of care cert/re-cert  Contracture of muscle, multiple sites - Plan: Ot plan of care cert/re-cert  Dupuytren's contracture of left hand - Plan: Ot plan of care cert/re-cert  Stiffness of left hand, not elsewhere classified - Plan: Ot plan of care cert/re-cert  Pain in left hand - Plan: Ot plan of care cert/re-cert    Problem List There are no problems to display for this patient.   Oletta Cohn OTR/L,CLT 01/04/2020, 5:06 PM  [place] [facility] PHYSICAL AND SPORTS MEDICINE [address] [place], [address] Phone: [phone]   Fax:  [phone]  Name: Jillian Ward MRN: [id number] Date of Birth: [birth year]

## 2020-01-07 ENCOUNTER — Other Ambulatory Visit: Payer: Self-pay

## 2020-01-07 ENCOUNTER — Ambulatory Visit: Attending: Surgery | Admitting: Occupational Therapy

## 2020-01-07 DIAGNOSIS — M720 Palmar fascial fibromatosis [Dupuytren]: Secondary | ICD-10-CM | POA: Diagnosis present

## 2020-01-07 DIAGNOSIS — M25642 Stiffness of left hand, not elsewhere classified: Secondary | ICD-10-CM | POA: Insufficient documentation

## 2020-01-07 DIAGNOSIS — M6249 Contracture of muscle, multiple sites: Secondary | ICD-10-CM | POA: Insufficient documentation

## 2020-01-07 DIAGNOSIS — M79642 Pain in left hand: Secondary | ICD-10-CM | POA: Insufficient documentation

## 2020-01-07 DIAGNOSIS — L905 Scar conditions and fibrosis of skin: Secondary | ICD-10-CM | POA: Diagnosis not present

## 2020-01-07 NOTE — Therapy (Signed)
[facility] [address] [place], [address] Phone: [phone]   Fax:  [phone]  Occupational Therapy Treatment  Patient Details  Name: Jillian Ward MRN: [id number] Date of Birth: 08/02/1965 Referring Provider (OT): Dr Joette Catching   Encounter Date: 01/07/2020   OT End of Session - 01/07/20 1030    Visit Number 2    Number of Visits 12    Date for OT Re-Evaluation 02/15/20    OT Start Time 0935    OT Stop Time 1007    OT Time Calculation (min) 32 min    Activity Tolerance Patient tolerated treatment well    Behavior During Therapy [facility] for tasks assessed/performed           Past Medical History:  Diagnosis Date  . Anxiety   . Arthritis    rheumatoid  . Depression   . Hypertension   . Nodule of right lung   . Rib fracture     Past Surgical History:  Procedure Laterality Date  . APPENDECTOMY    . CESAREAN SECTION    . COLONOSCOPY WITH PROPOFOL N/A 01/15/2016   Procedure: COLONOSCOPY WITH PROPOFOL;  Surgeon: Scot Jun, MD;  Location: [facility] ENDOSCOPY;  Service: Endoscopy;  Laterality: N/A;  . 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 HERNIA REPAIR  2005   ABDOMINAL  . HERNIA REPAIR     UMBILICAL    There were no vitals filed for this visit.   Subjective Assessment - 01/07/20 0948    Subjective  Scar feels and looks better. My motion is better -but it is hurting more    Pertinent History Pt had L dupuytrens release on 11/03/2019 by Dr Joice Lofts - stitches come out 8/10 and then refer to OT on 12/17/2019 but was unable to get to therapy with death in family    Patient Stated Goals Don't want my fingers to curl up again , scar better and stiffness better so I can use my hand in yard work - lover being  outside    Currently in Pain? Yes    Pain Score 5     Pain Location Finger (Comment which one)    Pain Orientation Left    Pain Descriptors / Indicators Aching    Pain Type Surgical pain    Pain Onset In the past 7 days    Pain Frequency Intermittent              [facility] OT Assessment - 01/07/20 0001      Left Hand AROM   L Ring  MCP 0-90 85 Degrees    L Ring PIP 0-100 100 Degrees    L Little  MCP 0-90 90 Degrees    L Little PIP 0-100 90 Degrees   -45          Pt arrive with reports of increase pain 5/10 at PIP - upon review - pt had LMB spring splint for PIP extention on to much tension - review again with pt how to decrease or increase tension Pt to hold off today on use  Also not to force flexion of 5th - stretch passively - only AROM  Focus on scar mobs , edema control and AROM          OT Treatments/Exercises (OP) -  01/07/20 0001      RUE Contrast Bath   Time 8 minutes    Comments at [facility] to decrease pain and edema          Pain decrease to 2/10   review HEP again  And scar massage done by OT and using mini massager on volar scar cont cica scar pad for night time - improving since last time- not as thick and hyper trophic  Silicon sleeve for 5th to use night time and daytime - for scar tissue decrease (provided 2 new ones)  Use LMB splint for PIP extention 3 min 3 x day after contrast and massage - REED on how to decrease tension  extention stretch for PIP extention on table  Tendon glides -blocked 10 reps each - but do not force composite - only AROM           OT Education - 01/07/20 1030    Education Details progress and changes to HEP    Person(s) Educated Patient    Methods Explanation;Demonstration;Tactile cues;Verbal cues;Handout    Comprehension Verbal cues required;Returned demonstration;Verbalized understanding            OT Short Term Goals - 01/04/20 1700      OT SHORT TERM GOAL #1   Title Pt to be independent in HEP to increase  AROM for flexion , extention at 5th digit , decrease scar tissue adhesion and tenderness to increase ROM    Baseline tender , tight and thick scar - extention -45 and MC and PIP decrease flexion 5th - edema more than 1 cm    Time 2    Period Weeks    Status New    Target Date 01/18/20             OT Long Term Goals - 01/04/20 1701      OT LONG TERM GOAL #1   Title L 4th and 5th digit flexion increase to [facility] to touch palm to hold 1 cm objects and hold to stabilze knife while cutting    Baseline decrease flexion 80 and 82 at 4th and 5th , 85 flexion of PIP 5th    Time 4    Period Weeks    Status New    Target Date 02/01/20      OT LONG TERM GOAL #2   Title Extention of 5th PIP improve with more than 20 degrees to get hand with more ease in pocket and gloves    Baseline 5th PIP extention -45 degrees    Time 6    Period Weeks    Status New    Target Date 02/15/20      OT LONG TERM GOAL #3   Title L grip strength increase with 4 lbs to hold and grip tools for garden with no pain    Baseline R hand 30 , L 29 - tenderness with pressure and massage ,gripping tools    Time 6    Period Weeks    Status New    Target Date 02/15/20                 Plan - 01/07/20 1032    Clinical Impression Statement Pt is 9 wks s/p L dupuytrens contracture release on 4th and 5th - pt report increase pain in PIP of 5th - pt had LMB extention splint on to much tension and was forcing flexion - pt to hold off on any LMB splint or PROM today -and decrease pain  and edema - scar tissue improving - flexion improved- review again with pt HEP    OT Occupational Profile and History Problem Focused Assessment - Including review of records relating to presenting problem    Occupational performance deficits (Please refer to evaluation for details): ADL's;IADL's;Play;Leisure;Social Participation    Body Structure / Function / Physical Skills ADL;Coordination;Edema;Decreased knowledge of  precautions;Flexibility;IADL;Pain;Strength;Scar mobility;ROM;UE functional use    Rehab Potential Good    Clinical Decision Making Limited treatment options, no task modification necessary    Comorbidities Affecting Occupational Performance: None    Modification or Assistance to Complete Evaluation  No modification of tasks or assist necessary to complete eval    OT Frequency 2x / week    OT Duration 6 weeks    OT Treatment/Interventions Self-care/ADL training;Moist Heat;Paraffin;Fluidtherapy;Contrast Bath;Therapeutic exercise;Manual Therapy;Passive range of motion;Scar mobilization;Splinting;Patient/family education    Plan assess progres with HEP    OT Home Exercise Plan see pt instruction    Consulted and Agree with Plan of Care Patient           Patient will benefit from skilled therapeutic intervention in order to improve the following deficits and impairments:   Body Structure / Function / Physical Skills: ADL, Coordination, Edema, Decreased knowledge of precautions, Flexibility, IADL, Pain, Strength, Scar mobility, ROM, UE functional use       Visit Diagnosis: Scar condition and fibrosis of skin  Contracture of muscle, multiple sites  Dupuytren's contracture of left hand  Stiffness of left hand, not elsewhere classified  Pain in left hand    Problem List There are no problems to display for this patient.   Oletta Cohn OTR/L,CLT 01/07/2020, 10:35 AM  [facility] PHYSICAL AND SPORTS MEDICINE [address], [place], [address] Phone: [phone]   Fax:  [phone]  Name: Jillian Ward MRN: [id number] Date of Birth: 1966-01-15

## 2020-01-11 ENCOUNTER — Other Ambulatory Visit: Payer: Self-pay

## 2020-01-11 ENCOUNTER — Ambulatory Visit: Admitting: Occupational Therapy

## 2020-01-11 DIAGNOSIS — L905 Scar conditions and fibrosis of skin: Secondary | ICD-10-CM

## 2020-01-11 DIAGNOSIS — M6249 Contracture of muscle, multiple sites: Secondary | ICD-10-CM

## 2020-01-11 DIAGNOSIS — M25642 Stiffness of left hand, not elsewhere classified: Secondary | ICD-10-CM

## 2020-01-11 DIAGNOSIS — M720 Palmar fascial fibromatosis [Dupuytren]: Secondary | ICD-10-CM

## 2020-01-11 DIAGNOSIS — M79642 Pain in left hand: Secondary | ICD-10-CM

## 2020-01-11 NOTE — Therapy (Signed)
[facility] [address] [place], [address] Phone: [phone]   Fax:  [phone]  Occupational Therapy Treatment  Patient Details  Name: Jillian Ward MRN: [id number] Date of Birth: November 28, 1965 Referring Provider (OT): Dr Joette Catching   Encounter Date: 01/11/2020   OT End of Session - 01/11/20 0952    Visit Number 3    Number of Visits 12    Date for OT Re-Evaluation 02/15/20    OT Start Time 0931    OT Stop Time 1012    OT Time Calculation (min) 41 min    Activity Tolerance Patient tolerated treatment well    Behavior During Therapy [facility] for tasks assessed/performed           Past Medical History:  Diagnosis Date  . Anxiety   . Arthritis    rheumatoid  . Depression   . Hypertension   . Nodule of right lung   . Rib fracture     Past Surgical History:  Procedure Laterality Date  . APPENDECTOMY    . CESAREAN SECTION    . COLONOSCOPY WITH PROPOFOL N/A 01/15/2016   Procedure: COLONOSCOPY WITH PROPOFOL;  Surgeon: Scot Jun, MD;  Location: [facility] ENDOSCOPY;  Service: Endoscopy;  Laterality: N/A;  . 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 HERNIA REPAIR  2005   ABDOMINAL  . HERNIA REPAIR     UMBILICAL    There were no vitals filed for this visit.   Subjective Assessment - 01/11/20 0948    Subjective  My neck is hurting so bad - yesterday I ended up acute care for 4 hours - taking some meds - so finger not hurting    Pertinent History Pt had L dupuytrens release on 11/03/2019 by Dr Joice Lofts - stitches come out 8/10 and then refer to OT on 12/17/2019 but was unable to get to therapy with death in family    Patient Stated Goals Don't want my fingers to curl up again , scar better and stiffness better so I can use  my hand in yard work - lover being outside    Currently in Pain? Yes    Pain Score 5     Pain Location Finger (Comment which one)    Pain Orientation Left    Pain Descriptors / Indicators Aching    Pain Type Surgical pain    Pain Onset In the past 7 days              [facility] OT Assessment - 01/11/20 0001      Left Hand AROM   L Ring  MCP 0-90 90 Degrees   -10   L Ring PIP 0-100 100 Degrees    L Little  MCP 0-90 90 Degrees    L Little PIP 0-100 90 Degrees   -45         AROM about the same- pt having trouble with her strain muscle on R side of her neck - was at acute care yesterday and on some muscle relaxer and pain meds  So hand per pt not bothering her - but spring splint do not feel same as mine And making fist - some pain in PIP and DIP of 5th - 5/10           OT  Treatments/Exercises (OP) - 01/11/20 0001      RUE Paraffin   Number Minutes Paraffin 8 Minutes    RUE Paraffin Location Hand    Comments LMB splint on 5th PIP for extention stretch            Pain decrease to 2/10    Review with pt  scar massage - to focus on scar to 4th - and over volar MC - tight and pulling 4th MC into flexion- Scar massage done by OT and using mini massager Provided pt new cica scar pad for night time - hyper trophic this date and tight - pulling 4th MC in flexion  Silicon sleeve for 5th to use night time and daytime - for scar tissue decrease (provided 2 new ones)  Use LMB splint for PIP extention 3 min 3 x day after contrast and massage - REED on how to decrease tension  extention stretch for PIP extention on table - pt to bring in next time Tendon glides -blocked 10 reps each - but do not force composite - only AROM   Hand base extention splint fabricated this date with 2nd thru 4th included for extention to 4th [facility] - 5th not -  Wear night time - would not allow to get enough extention of 4th MC - pt to use LMB splint on 5th for PIP extention          OT Education -  01/11/20 0952    Education Details progress and changes to HEP    Person(s) Educated Patient    Methods Explanation;Demonstration;Tactile cues;Verbal cues;Handout    Comprehension Verbal cues required;Returned demonstration;Verbalized understanding            OT Short Term Goals - 01/04/20 1700      OT SHORT TERM GOAL #1   Title Pt to be independent in HEP to increase AROM for flexion , extention at 5th digit , decrease scar tissue adhesion and tenderness to increase ROM    Baseline tender , tight and thick scar - extention -45 and MC and PIP decrease flexion 5th - edema more than 1 cm    Time 2    Period Weeks    Status New    Target Date 01/18/20             OT Long Term Goals - 01/04/20 1701      OT LONG TERM GOAL #1   Title L 4th and 5th digit flexion increase to [facility] to touch palm to hold 1 cm objects and hold to stabilze knife while cutting    Baseline decrease flexion 80 and 82 at 4th and 5th , 85 flexion of PIP 5th    Time 4    Period Weeks    Status New    Target Date 02/01/20      OT LONG TERM GOAL #2   Title Extention of 5th PIP improve with more than 20 degrees to get hand with more ease in pocket and gloves    Baseline 5th PIP extention -45 degrees    Time 6    Period Weeks    Status New    Target Date 02/15/20      OT LONG TERM GOAL #3   Title L grip strength increase with 4 lbs to hold and grip tools for garden with no pain    Baseline R hand 30 , L 29 - tenderness with pressure and massage ,gripping tools    Time 6  Period Weeks    Status New    Target Date 02/15/20                 Plan - 01/11/20 1016    Clinical Impression Statement Pt is 9 1/2 wks s/p L dupuytrens contracture release on 4th and 5th- pt has 45 flexor contracture at 5th PIP and some pain with flexion of DIP and PIP - pt did adjust her LMB splint - pt to bring in next time for OT to assess- did fabricate hand base static splint for night time use for 4th - did not inclue  5th to 4th - because of large difference in flexor contracture - pt to focus on scar massage to volar 4th MC and palm    OT Occupational Profile and History Problem Focused Assessment - Including review of records relating to presenting problem    Occupational performance deficits (Please refer to evaluation for details): ADL's;IADL's;Play;Leisure;Social Participation    Body Structure / Function / Physical Skills ADL;Coordination;Edema;Decreased knowledge of precautions;Flexibility;IADL;Pain;Strength;Scar mobility;ROM;UE functional use    Rehab Potential Good    Clinical Decision Making Limited treatment options, no task modification necessary    Comorbidities Affecting Occupational Performance: None    Modification or Assistance to Complete Evaluation  No modification of tasks or assist necessary to complete eval    OT Frequency 2x / week    OT Duration 6 weeks    OT Treatment/Interventions Self-care/ADL training;Moist Heat;Paraffin;Fluidtherapy;Contrast Bath;Therapeutic exercise;Manual Therapy;Passive range of motion;Scar mobilization;Splinting;Patient/family education    Plan assess progres with HEP    OT Home Exercise Plan see pt instruction    Consulted and Agree with Plan of Care Patient           Patient will benefit from skilled therapeutic intervention in order to improve the following deficits and impairments:   Body Structure / Function / Physical Skills: ADL, Coordination, Edema, Decreased knowledge of precautions, Flexibility, IADL, Pain, Strength, Scar mobility, ROM, UE functional use       Visit Diagnosis: Scar condition and fibrosis of skin  Contracture of muscle, multiple sites  Dupuytren's contracture of left hand  Stiffness of left hand, not elsewhere classified  Pain in left hand    Problem List There are no problems to display for this patient.   Oletta Cohn OTR/L,CLT 01/11/2020, 2:07 PM  [facility] PHYS[facility]  SPORTS MEDICINE [address] [place], [address] Phone: [phone]   Fax:  [phone]  Name: Walterine Amodei Gurganus MRN: [id number] Date of Birth: 23-Jul-1965

## 2020-01-13 ENCOUNTER — Ambulatory Visit: Admitting: Occupational Therapy

## 2020-01-18 ENCOUNTER — Other Ambulatory Visit: Payer: Self-pay

## 2020-01-18 ENCOUNTER — Ambulatory Visit: Admitting: Occupational Therapy

## 2020-01-18 DIAGNOSIS — L905 Scar conditions and fibrosis of skin: Secondary | ICD-10-CM | POA: Diagnosis not present

## 2020-01-18 DIAGNOSIS — M6249 Contracture of muscle, multiple sites: Secondary | ICD-10-CM

## 2020-01-18 DIAGNOSIS — M720 Palmar fascial fibromatosis [Dupuytren]: Secondary | ICD-10-CM

## 2020-01-18 DIAGNOSIS — M79642 Pain in left hand: Secondary | ICD-10-CM

## 2020-01-18 DIAGNOSIS — M25642 Stiffness of left hand, not elsewhere classified: Secondary | ICD-10-CM

## 2020-01-18 NOTE — Therapy (Signed)
[facility]TS MEDICINE [address], [place], [address] Phone: [phone]   Fax:  [phone]  Occupational Therapy Treatment  Patient Details  Name: Jillian Ward MRN: [id number] Date of Birth: 01/03/1966 Referring Provider (OT): Dr Joette Catching   Encounter Date: 01/18/2020   OT End of Session - 01/18/20 1238    Visit Number 4    Number of Visits 12    Date for OT Re-Evaluation 02/15/20    OT Start Time 1145    OT Stop Time 1225    OT Time Calculation (min) 40 min    Activity Tolerance Patient tolerated treatment well    Behavior During Therapy [facility] for tasks assessed/performed           Past Medical History:  Diagnosis Date  . Anxiety   . Arthritis    rheumatoid  . Depression   . Hypertension   . Nodule of right lung   . Rib fracture     Past Surgical History:  Procedure Laterality Date  . APPENDECTOMY    . CESAREAN SECTION    . COLONOSCOPY WITH PROPOFOL N/A 01/15/2016   Procedure: COLONOSCOPY WITH PROPOFOL;  Surgeon: Scot Jun, MD;  Location: [facility] ENDOSCOPY;  Service: Endoscopy;  Laterality: N/A;  . 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 HERNIA REPAIR  2005   ABDOMINAL  . HERNIA REPAIR     UMBILICAL    There were no vitals filed for this visit.   Subjective Assessment - 01/18/20 1236    Subjective  My hand is doing better- scar tissue looks so much better - not as thick , tender and stiffness is getting better- but I cannot tolerate splint all night    Pertinent History Pt had L dupuytrens release on 11/03/2019 by Dr Joice Lofts - stitches come out 8/10 and then refer to OT on 12/17/2019 but was unable to get to therapy with death in family    Patient Stated Goals Don't want my fingers to curl up again , scar  better and stiffness better so I can use my hand in yard work - lover being outside    Currently in Pain? No/denies              [facility] OT Assessment - 01/18/20 0001      Strength   Right Hand Grip (lbs) 30    Right Hand Lateral Pinch 15 lbs    Right Hand 3 Point Pinch 12 lbs    Left Hand Grip (lbs) 42    Left Hand Lateral Pinch 17 lbs    Left Hand 3 Point Pinch 16 lbs           Pt show progress - MC 90 , PIP's 90 and PIP extention of 5th -45 , MC of 4th extentoin -5          OT Treatments/Exercises (OP) - 01/18/20 0001      RUE Paraffin   Number Minutes Paraffin 8 Minutes    RUE Paraffin Location Hand    Comments LMB splint on 5th for PIP extention            per pt tenderness and pain decrease - and edema in 5th    Scar massage tissue improved in thickness , and tightness - pt to cont  with scar massage Scar massage done by OT and using mini massager Cont with cica scar pad for night time - hyper trophic scar tissue improving  Silicon sleeve for 5th to use night time and daytime - for scar tissue decrease(provided 2 new ones)  Use LMB splint for PIP extention 3 min 3 x day after contrast and massage- REED on how to decrease tension extention stretch for PIP extention on table - pt to bring in next time Tendon glides -blocked 10reps each - but do not force composite - only AROM  Hand base extention splint fabricated  Last time - pt to wear evening before night time - if not can tolerate at night time  Splint for  2nd thru 4th included for extention to 4th [facility] - 5th not included because  would not allow to get enough extention of 4th MC - pt to use LMB splint on 5th for PIP extention   Teal med putty add to HEP to do gripping 2 x 12 reps - 2 x day - and rolling for scar mobs and extention of digits inbetween 10 reps          OT Education - 01/18/20 1237    Education Details progress and changes to HEP    Person(s) Educated Patient    Methods  Explanation;Demonstration;Tactile cues;Verbal cues;Handout    Comprehension Verbal cues required;Returned demonstration;Verbalized understanding            OT Short Term Goals - 01/18/20 1240      OT SHORT TERM GOAL #1   Title Pt to be independent in HEP to increase AROM for flexion , extention at 5th digit , decrease scar tissue adhesion and tenderness to increase ROM    Status Achieved             OT Long Term Goals - 01/18/20 1240      OT LONG TERM GOAL #1   Title L 4th and 5th digit flexion increase to [facility] to touch palm to hold 1 cm objects and hold to stabilze knife while cutting    Baseline decrease flexion 80 and 82 at 4th and 5th , 85 flexion of PIP 5th - improving    Time 2    Period Weeks    Status On-going    Target Date 02/01/20      OT LONG TERM GOAL #2   Title Extention of 5th PIP improve with more than 20 degrees to get hand with more ease in pocket and gloves    Baseline staying same -PIP was not release in surgery    Status Deferred      OT LONG TERM GOAL #3   Title L grip strength increase with 4 lbs to hold and grip tools for garden with no pain    Baseline Grip in  L hand increase to 42 lbs - and able to hold objects and tools    Status Achieved                 Plan - 01/18/20 1238    Clinical Impression Statement Pt is about 10 1/2 wks s/p L dupuytrens contracture release - 5th PIP extentoin staying same at -45. Show progress in scar tissue,, tenderness and pain , and increase grip and prehension strength - cont to have increase edema of 0.8 cm at 5th PIP  cont with scar mobs , compression , scar pad and ROM - did add light putty for grip and rolling for extnetion /  scar mobs    OT Occupational Profile and History Problem Focused Assessment - Including review of records relating to presenting problem    Occupational performance deficits (Please refer to evaluation for details): ADL's;IADL's;Play;Leisure;Social Participation    Body Structure /  Function / Physical Skills ADL;Coordination;Edema;Decreased knowledge of precautions;Flexibility;IADL;Pain;Strength;Scar mobility;ROM;UE functional use    Rehab Potential Good    Clinical Decision Making Limited treatment options, no task modification necessary    Comorbidities Affecting Occupational Performance: None    Modification or Assistance to Complete Evaluation  No modification of tasks or assist necessary to complete eval    OT Frequency 1x / week    OT Duration 6 weeks    OT Treatment/Interventions Self-care/ADL training;Moist Heat;Paraffin;Fluidtherapy;Contrast Bath;Therapeutic exercise;Manual Therapy;Passive range of motion;Scar mobilization;Splinting;Patient/family education    Plan assess progres with HEP    OT Home Exercise Plan see pt instruction    Consulted and Agree with Plan of Care Patient           Patient will benefit from skilled therapeutic intervention in order to improve the following deficits and impairments:   Body Structure / Function / Physical Skills: ADL, Coordination, Edema, Decreased knowledge of precautions, Flexibility, IADL, Pain, Strength, Scar mobility, ROM, UE functional use       Visit Diagnosis: Scar condition and fibrosis of skin  Contracture of muscle, multiple sites  Dupuytren's contracture of left hand  Stiffness of left hand, not elsewhere classified  Pain in left hand    Problem List There are no problems to display for this patient.   Oletta Cohn OTR/L,CLT 01/18/2020, 12:44 PM  [facility] PHYSICAL AND SPORTS MEDICINE [address] [place], [address] Phone: [phone]   Fax:  [phone]  Name: Jillian Ward MRN: [id number] Date of Birth: [birth year]

## 2020-01-25 ENCOUNTER — Other Ambulatory Visit: Payer: Self-pay

## 2020-01-25 ENCOUNTER — Ambulatory Visit: Admitting: Occupational Therapy

## 2020-01-25 DIAGNOSIS — M79642 Pain in left hand: Secondary | ICD-10-CM

## 2020-01-25 DIAGNOSIS — L905 Scar conditions and fibrosis of skin: Secondary | ICD-10-CM

## 2020-01-25 DIAGNOSIS — M720 Palmar fascial fibromatosis [Dupuytren]: Secondary | ICD-10-CM

## 2020-01-25 DIAGNOSIS — M25642 Stiffness of left hand, not elsewhere classified: Secondary | ICD-10-CM

## 2020-01-25 DIAGNOSIS — M6249 Contracture of muscle, multiple sites: Secondary | ICD-10-CM

## 2020-01-25 NOTE — Therapy (Signed)
[facility] [address], [place], [address] Phone: [phone]   Fax:  [phone]  Occupational Therapy Treatment  Patient Details  Name: Jillian Ward MRN: [id number] Date of Birth: Oct 18, 1965 Referring Provider (OT): Dr Joette Catching   Encounter Date: 01/25/2020   OT End of Session - 01/25/20 1253    Visit Number 5    Number of Visits 12    Date for OT Re-Evaluation 02/15/20    OT Start Time 0901    OT Stop Time 0941    OT Time Calculation (min) 40 min    Activity Tolerance Patient tolerated treatment well    Behavior During Therapy [facility] for tasks assessed/performed           Past Medical History:  Diagnosis Date  . Anxiety   . Arthritis    rheumatoid  . Depression   . Hypertension   . Nodule of right lung   . Rib fracture     Past Surgical History:  Procedure Laterality Date  . APPENDECTOMY    . CESAREAN SECTION    . COLONOSCOPY WITH PROPOFOL N/A 01/15/2016   Procedure: COLONOSCOPY WITH PROPOFOL;  Surgeon: Scot Jun, MD;  Location: [facility] ENDOSCOPY;  Service: Endoscopy;  Laterality: N/A;  . 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 HERNIA REPAIR  2005   ABDOMINAL  . HERNIA REPAIR     UMBILICAL    There were no vitals filed for this visit.   Subjective Assessment - 01/25/20 1252    Subjective  I been using my hand in yard this past weekend - little sore and stiff in the am - and if cold - but I do have some arthritis - but more strength and scar improving    Pertinent History Pt had L dupuytrens release on 11/03/2019 by Dr Joice Lofts - stitches come out 8/10 and then refer to OT on 12/17/2019 but was unable to get to therapy with death in family    Patient Stated Goals Don't want my fingers to curl up  again , scar better and stiffness better so I can use my hand in yard work - lover being outside    Currently in Pain? Yes    Pain Score 3     Pain Location Hand    Pain Orientation Left    Pain Descriptors / Indicators Tightness   stiffness   Pain Type Surgical pain;Chronic pain    Pain Onset More than a month ago    Pain Frequency Intermittent              [facility] OT Assessment - 01/25/20 0001      Strength   Right Hand Grip (lbs) NT    spasm in neck    Left Hand Grip (lbs) 52    Left Hand Lateral Pinch 17 lbs    Left Hand 3 Point Pinch 16 lbs      Left Hand AROM   L Ring  MCP 0-90 90 Degrees   -10   L Ring PIP 0-100 100 Degrees    L Little  MCP 0-90 90 Degrees    L Little PIP 0-100 90 Degrees   -45             Edema decreasing in 5th -but still some  soreness and stiffness in hand - pt report scar tight in am and when cold -per pt has some arthritis But has paraffin bath to use at home - ed on using at home to cont with progress       OT Treatments/Exercises (OP) - 01/25/20 0001      RUE Paraffin   Number Minutes Paraffin 8 Minutes    RUE Paraffin Location Hand    Comments prior to review of HEP , scar massage and ROM              Scar massage tissue improved in thickness , and tightness - pt to cont with scar massage Scar massage done by OT and using mini massager Cont with cica scar pad for night time- hyper trophic scar tissue improving  Silicon sleeve for 4th 5th to use night time and daytime - for scar tissue decrease(provided 2 new ones and cica scar pads to use for month)  Use LMB splint for PIP extention 3 min 3 x day after contrast and massage- REED on how to decrease tension  Tendon glides -blocked 10reps each - but do not force composite - only AROM  Hand base extention splint fabricated  - pt to wear evening before night time Splint for  2nd thru 4th included for extention to 4th [facility] - 5th not included because  would not allow to  get enough extention of 4th MC - pt to use LMB splint on 5th for PIP extention   Teal med putty add last time - grip increase to 52 lbs -pt to stop and do only rolling for scar mobs and extention of digits inbetween 10 reps         OT Education - 01/25/20 1253    Education Details progress and changes to HEP    Person(s) Educated Patient    Methods Explanation;Demonstration;Tactile cues;Verbal cues;Handout    Comprehension Verbal cues required;Returned demonstration;Verbalized understanding            OT Short Term Goals - 01/18/20 1240      OT SHORT TERM GOAL #1   Title Pt to be independent in HEP to increase AROM for flexion , extention at 5th digit , decrease scar tissue adhesion and tenderness to increase ROM    Status Achieved             OT Long Term Goals - 01/18/20 1240      OT LONG TERM GOAL #1   Title L 4th and 5th digit flexion increase to [facility] to touch palm to hold 1 cm objects and hold to stabilze knife while cutting    Baseline decrease flexion 80 and 82 at 4th and 5th , 85 flexion of PIP 5th - improving    Time 2    Period Weeks    Status On-going    Target Date 02/01/20      OT LONG TERM GOAL #2   Title Extention of 5th PIP improve with more than 20 degrees to get hand with more ease in pocket and gloves    Baseline staying same -PIP was not release in surgery    Status Deferred      OT LONG TERM GOAL #3   Title L grip strength increase with 4 lbs to hold and grip tools for garden with no pain    Baseline Grip in  L hand increase to 42 lbs - and able to hold objects and tools    Status Achieved  Plan - 01/25/20 1254    Clinical Impression Statement Pt is about 3 months out from s/p Ldupuytrents contracture release - 5th PIP staying steady at -45 - edema is decreasing in PIP - scar improving - still thick on volar crease of MC at 4th and 5th - pt to focus on those and extention of MC extention on table , can cont with hadn  base splint in evening for few hrs, scar massage and scar pad - compression for edema on 4th and 5th PIP - and follow up with me in month - great progress in grip strength and tenderness / thickness of scar    OT Occupational Profile and History Problem Focused Assessment - Including review of records relating to presenting problem    Occupational performance deficits (Please refer to evaluation for details): ADL's;IADL's;Play;Leisure;Social Participation    Body Structure / Function / Physical Skills ADL;Coordination;Edema;Decreased knowledge of precautions;Flexibility;IADL;Pain;Strength;Scar mobility;ROM;UE functional use    Rehab Potential Good    Clinical Decision Making Limited treatment options, no task modification necessary    Comorbidities Affecting Occupational Performance: None    Modification or Assistance to Complete Evaluation  No modification of tasks or assist necessary to complete eval    OT Frequency Monthly    OT Duration 4 weeks    OT Treatment/Interventions Self-care/ADL training;Moist Heat;Paraffin;Fluidtherapy;Contrast Bath;Therapeutic exercise;Manual Therapy;Passive range of motion;Scar mobilization;Splinting;Patient/family education    Plan assess progres with HEP    OT Home Exercise Plan see pt instruction           Patient will benefit from skilled therapeutic intervention in order to improve the following deficits and impairments:   Body Structure / Function / Physical Skills: ADL, Coordination, Edema, Decreased knowledge of precautions, Flexibility, IADL, Pain, Strength, Scar mobility, ROM, UE functional use       Visit Diagnosis: Contracture of muscle, multiple sites  Dupuytren's contracture of left hand  Stiffness of left hand, not elsewhere classified  Pain in left hand  Scar condition and fibrosis of skin    Problem List There are no problems to display for this patient.   Oletta Cohn OTR/L,CLT 01/25/2020, 12:59 PM  [facility] PHYSICAL AND SPORTS MEDICINE [address], [place], [address] Phone: [phone]   Fax:  [phone]  Name: Jillian Ward MRN: [id number] Date of Birth: 04/17/1965

## 2020-02-01 ENCOUNTER — Other Ambulatory Visit: Payer: Self-pay | Admitting: Physical Medicine and Rehabilitation

## 2020-02-01 ENCOUNTER — Other Ambulatory Visit (HOSPITAL_COMMUNITY): Payer: Self-pay | Admitting: Physical Medicine and Rehabilitation

## 2020-02-01 DIAGNOSIS — M5412 Radiculopathy, cervical region: Secondary | ICD-10-CM

## 2020-02-01 DIAGNOSIS — M5030 Other cervical disc degeneration, unspecified cervical region: Secondary | ICD-10-CM

## 2020-02-02 ENCOUNTER — Ambulatory Visit
Admission: RE | Admit: 2020-02-02 | Discharge: 2020-02-02 | Disposition: A | Discharge status: Home / Self Care | Source: Ambulatory Visit | Attending: Physical Medicine and Rehabilitation | Admitting: Physical Medicine and Rehabilitation

## 2020-02-02 ENCOUNTER — Other Ambulatory Visit: Payer: Self-pay

## 2020-02-02 DIAGNOSIS — M5030 Other cervical disc degeneration, unspecified cervical region: Secondary | ICD-10-CM | POA: Diagnosis not present

## 2020-02-02 DIAGNOSIS — M5412 Radiculopathy, cervical region: Secondary | ICD-10-CM | POA: Insufficient documentation

## 2020-02-02 IMAGING — MR MR CERVICAL SPINE W/O CM
5 series · 39 of 48 positions shown · non-contrast
Comparison: None.

CLINICAL DATA: Neck pain radiating to right extremity

EXAM:
MRI CERVICAL SPINE WITHOUT CONTRAST
TECHNIQUE: Multiplanar, multisequence MR imaging of the cervical spine was
performed. No intravenous contrast was administered.

[Series 5: T2 · sagittal · 3.0mm · 0.62mm/px · 6 of 15 slices shown (1 of 2)]
[im 1/15]
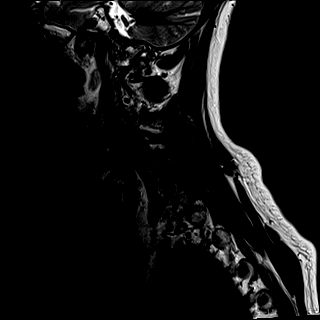
[im 3/15]
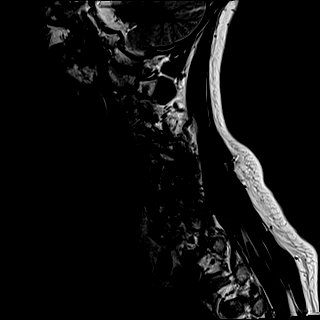
[im 6/15]
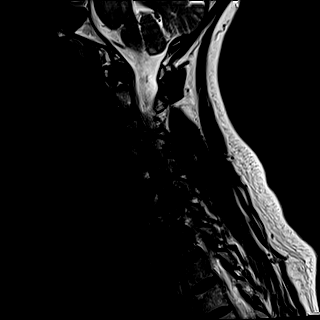
[im 9/15]
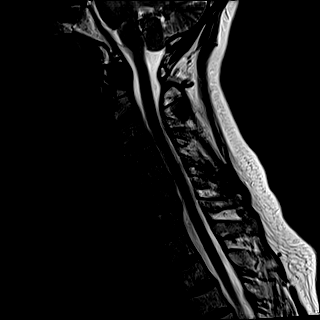
[im 12/15]
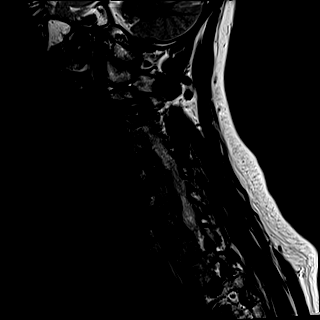
[im 15/15]
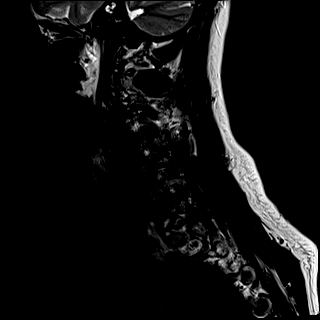

[Series 6: FLAIR · sagittal · 3.0mm · 0.78mm/px · 7 of 15 slices shown]
[im 1/15]
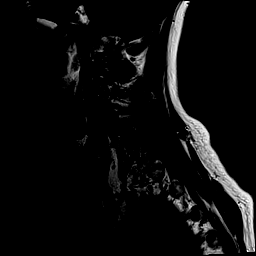
[im 3/15]
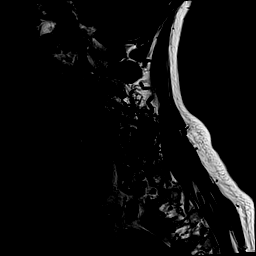
[im 5/15]
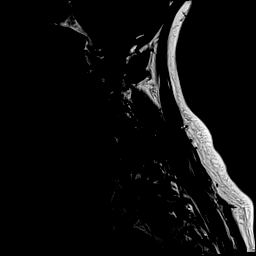
[im 8/15]
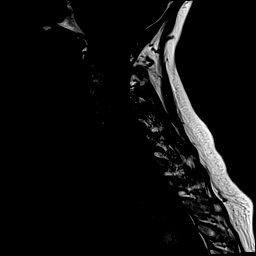
[im 10/15]
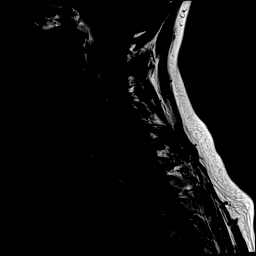
[im 12/15]
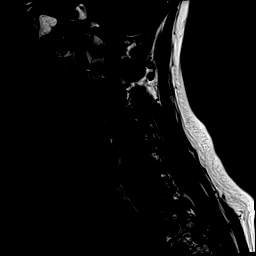
[im 15/15]
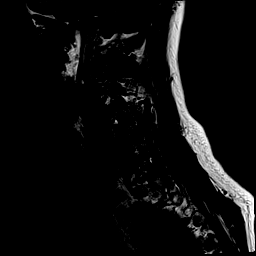

[Series 7: STIR · sagittal · 3.0mm · 0.62mm/px · 7 of 15 slices shown]
[im 1/15]
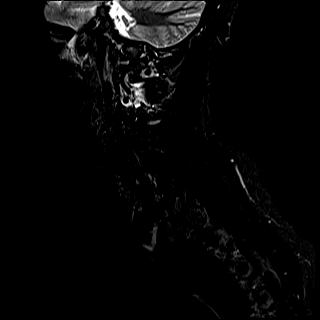
[im 3/15]
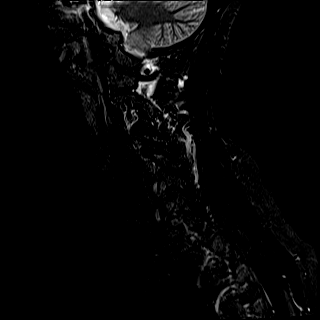
[im 5/15]
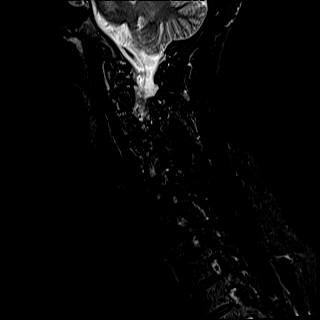
[im 8/15]
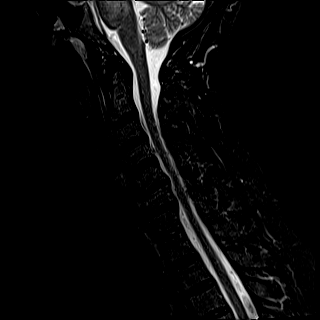
[im 10/15]
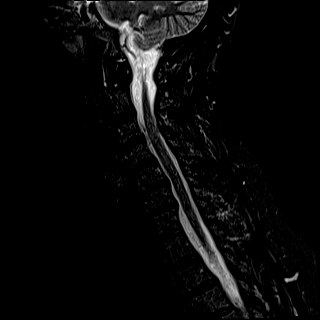
[im 12/15]
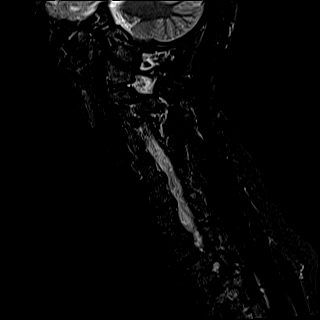
[im 15/15]
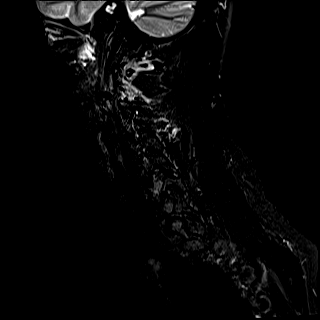

[Series 8: T2 · axial · 3.0mm · 0.70mm/px · z∈[-138,-45]mm · 11 of 29 slices shown (2 of 2)]
[im 1/29]
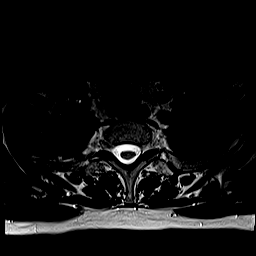
[im 3/29]
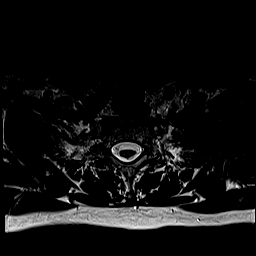
[im 5/29]
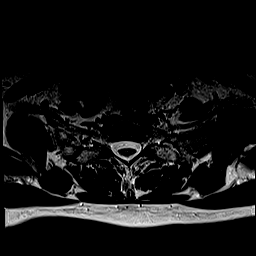
[im 7/29]
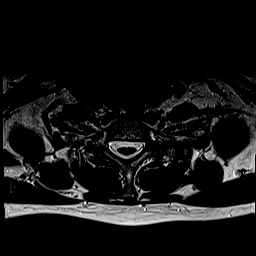
[im 9/29]
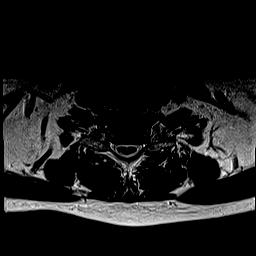
[im 11/29]
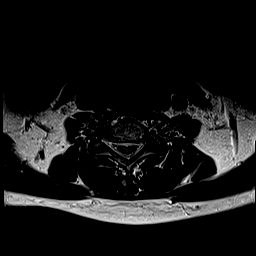
[im 13/29]
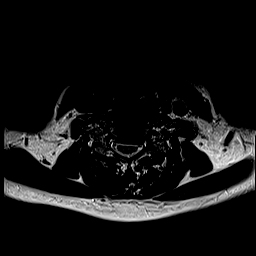
[im 16/29]
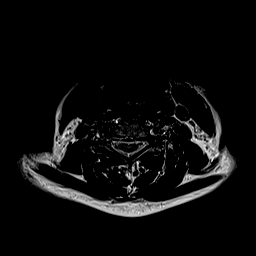
[im 20/29]
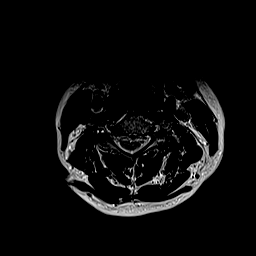
[im 24/29]
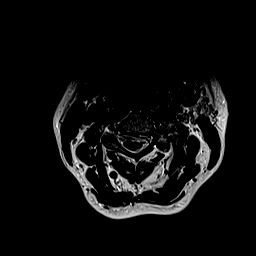
[im 29/29]
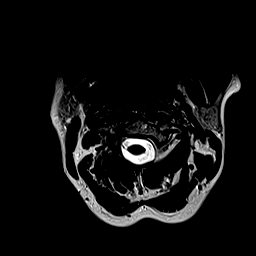

[Series 9: ax mpgr · axial · 3.0mm · 0.35mm/px · z∈[-138,-45]mm · 8 of 29 slices shown]
[im 1/29]
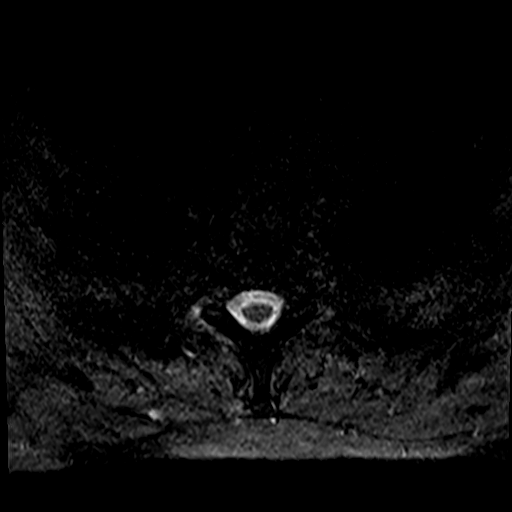
[im 5/29]
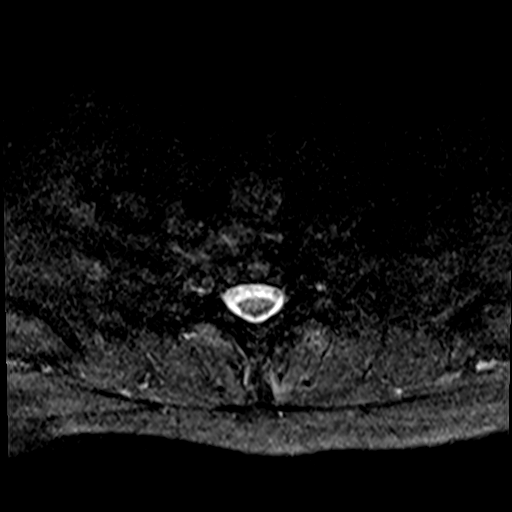
[im 9/29]
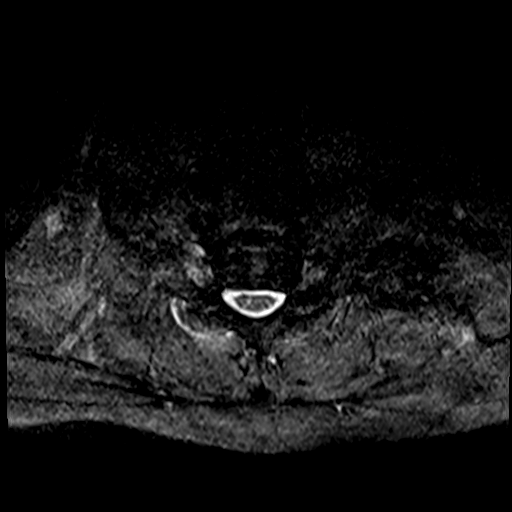
[im 13/29]
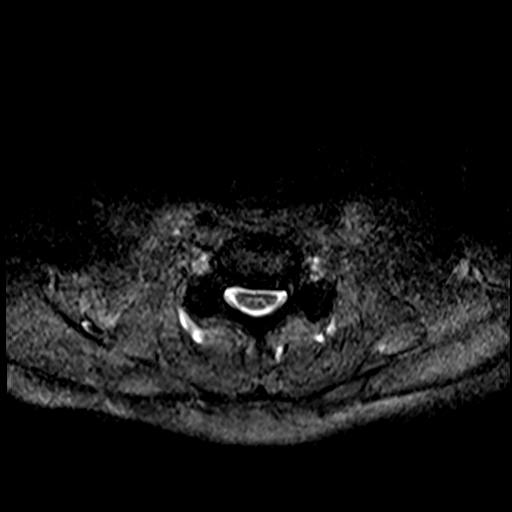
[im 16/29]
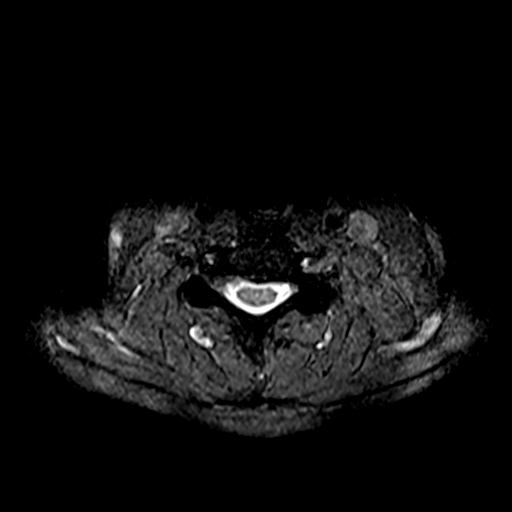
[im 20/29]
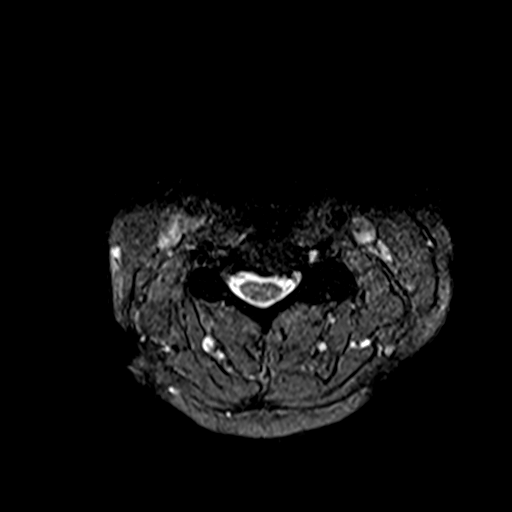
[im 24/29]
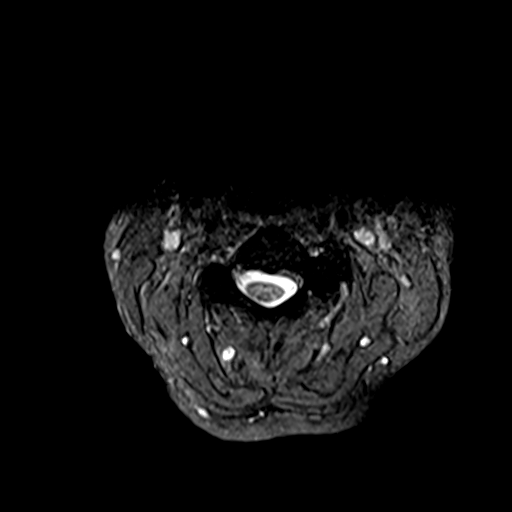
[im 29/29]
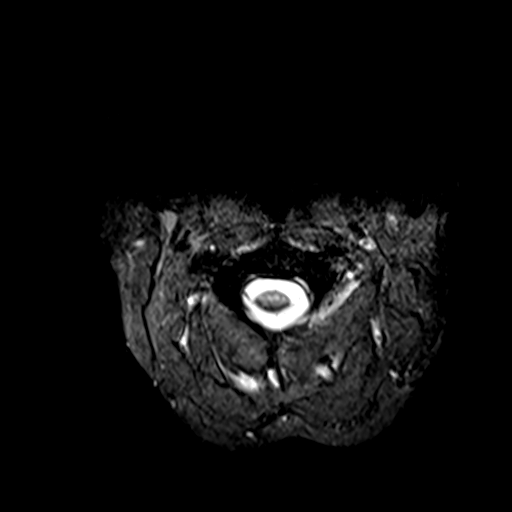

[39 of 48 positions shown; findings below may reference images not displayed]

FINDINGS: Alignment: Mild retrolisthesis at C6-C7.

Vertebrae: Cervical vertebral body heights are maintained. There is
no substantial marrow edema. No suspicious osseous lesion.

Cord: Normal caliber and signal.

Posterior Fossa, vertebral arteries, paraspinal tissues:
Unremarkable.

Disc levels:

C2-C3: Right facet hypertrophy. No canal or left foraminal stenosis.
Mild right foraminal stenosis.

C3-C4: Left foraminal disc osteophyte complex and left uncovertebral
and facet hypertrophy. No canal or right foraminal stenosis. Marked
left foraminal stenosis.

C4-C5: Disc bulge. Left facet hypertrophy. No canal or right
foraminal stenosis. Minor left foraminal stenosis.

C5-C6: Disc bulge with endplate osteophytes and facet and
uncovertebral hypertrophy. Minor canal stenosis. Mild to moderate
right and moderate left foraminal stenosis.

C6-C7: Disc bulge with endplate osteophytes and facet and
uncovertebral hypertrophy. No canal stenosis. Minor right and mild
left foraminal stenosis.

C7-T1:  Facet hypertrophy.  No canal or foraminal stenosis.
IMPRESSION: Multilevel degenerative changes as detailed above. There is no
high-grade canal or right foraminal stenosis. Foraminal stenosis is
present primarily on the left.

## 2020-02-17 ENCOUNTER — Ambulatory Visit: Attending: Surgery | Admitting: Occupational Therapy

## 2020-02-17 DIAGNOSIS — L905 Scar conditions and fibrosis of skin: Secondary | ICD-10-CM | POA: Insufficient documentation

## 2020-02-17 DIAGNOSIS — M25642 Stiffness of left hand, not elsewhere classified: Secondary | ICD-10-CM | POA: Insufficient documentation

## 2020-02-17 DIAGNOSIS — M720 Palmar fascial fibromatosis [Dupuytren]: Secondary | ICD-10-CM | POA: Insufficient documentation

## 2020-02-24 ENCOUNTER — Other Ambulatory Visit: Payer: Self-pay

## 2020-02-24 ENCOUNTER — Ambulatory Visit: Admitting: Occupational Therapy

## 2020-02-24 DIAGNOSIS — M720 Palmar fascial fibromatosis [Dupuytren]: Secondary | ICD-10-CM

## 2020-02-24 DIAGNOSIS — L905 Scar conditions and fibrosis of skin: Secondary | ICD-10-CM

## 2020-02-24 DIAGNOSIS — M25642 Stiffness of left hand, not elsewhere classified: Secondary | ICD-10-CM

## 2020-02-24 NOTE — Therapy (Signed)
[facility] PHYS[facility]TS MEDICINE [address], [place], [address] Phone: [phone]   Fax:  [phone]  Occupational Therapy Treatment  Patient Details  Name: Jillian Ward MRN: [id number] Date of Birth: 1965-09-22 Referring Provider (OT): Dr Joette Catching   Encounter Date: 02/24/2020   OT End of Session - 02/24/20 1209    Visit Number 6    Number of Visits 12    Date for OT Re-Evaluation 03/23/20    OT Start Time 1121    OT Stop Time 1200    OT Time Calculation (min) 39 min    Activity Tolerance Patient tolerated treatment well    Behavior During Therapy [facility] for tasks assessed/performed           Past Medical History:  Diagnosis Date  . Anxiety   . Arthritis    rheumatoid  . Depression   . Hypertension   . Nodule of right lung   . Rib fracture     Past Surgical History:  Procedure Laterality Date  . APPENDECTOMY    . CESAREAN SECTION    . COLONOSCOPY WITH PROPOFOL N/A 01/15/2016   Procedure: COLONOSCOPY WITH PROPOFOL;  Surgeon: Scot Jun, MD;  Location: [facility] ENDOSCOPY;  Service: Endoscopy;  Laterality: N/A;  . 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 HERNIA REPAIR  2005   ABDOMINAL  . HERNIA REPAIR     UMBILICAL    There were no vitals filed for this visit.   Subjective Assessment - 02/24/20 1207    Subjective  My neck is still bothering me - pain 10/10 - did had one shot - did not help at all - my hand was on the back burner little bit the last few wks- scar still little tender but using it    Pertinent History Pt had L dupuytrens release on 11/03/2019 by Dr Joice Lofts - stitches come out 8/10 and then refer to OT on 12/17/2019 but was unable to get to therapy with death in family    Patient Stated Goals Don't want my  fingers to curl up again , scar better and stiffness better so I can use my hand in yard work - lover being outside    Currently in Pain? Yes    Pain Score 10-Worst pain ever    Pain Location Neck    Pain Orientation Right              [facility] OT Assessment - 02/24/20 0001      Strength   Left Hand Grip (lbs) 52    Left Hand Lateral Pinch 18 lbs    Left Hand 3 Point Pinch 17 lbs          5th PIP extention -55 now - were -45 month ago Flexion 4th 90, 100 PIP extention -10 at [facility] of 4th  Flexion 5th 90 and 90 at [facility] and PIP           OT Treatments/Exercises (OP) - 02/24/20 0001      RUE Paraffin   Number Minutes Paraffin 8 Minutes    RUE Paraffin Location Hand    Comments LMB splint on 5th for PIP extention stretch             Scar massagetissue improved in thickness , and tightness - pt  to cont with scar massage for another month - focus on volar MC of 4th , MC 5th and prox phalanges Scar massage done by OT and using mini massager Cont withcica scar pad for night time- hyper trophicscar tissue improving- provided new one Silicon sleeve for 5th to use night time and daytime - for scar tissue decrease(provided 1 new one)  Use LMB splint for PIP extention 3-5 min 3 -5 x day during heat Tendon glides -blocked 10reps each - but do not force composite - only AROM  Hand base extention splint fabricated - pt to wear evening before bed time Splint for2nd thru 4th included for extention to 4th MC - 5th not included becausewould not allow to get enough extention of 4th MC - pt to use LMB splint on 5th for PIP extention   Can hold of on putty- focus scar tissue and ROM for month        OT Education - 02/24/20 1209    Education Details progress and changes to HEP    Person(s) Educated Patient    Methods Explanation;Demonstration;Tactile cues;Verbal cues;Handout    Comprehension Verbal cues required;Returned demonstration;Verbalized understanding             OT Short Term Goals - 02/24/20 1212      OT SHORT TERM GOAL #1   Title Pt to be independent in HEP to increase AROM for flexion , extention at 5th digit , decrease scar tissue adhesion and tenderness to increase ROM    Status Achieved             OT Long Term Goals - 02/24/20 1212      OT LONG TERM GOAL #1   Title L 4th and 5th digit flexion increase to [facility] to touch palm to hold 1 cm objects and hold to stabilze knife while cutting    Status Achieved      OT LONG TERM GOAL #2   Title Extention of 5th PIP improve with more than 20 degrees to get hand with more ease in pocket and gloves    Baseline Pt was -45 at PIP - the last month doing HEP at home - lost 10 degrees of extention - now -55 at 5th PIP    Time 4    Period Weeks    Status On-going    Target Date 03/23/20      OT LONG TERM GOAL #3   Title L grip strength increase with 4 lbs to hold and grip tools for garden with no pain    Baseline Grip on L now 52 , R NT because of some neck pain and getting shots    Status Achieved                 Plan - 02/24/20 1209    Clinical Impression Statement Pt return after not been seen for month - for pt to cont with HEP and maintain her extention to 5th PIP, MC of 4th - pt 4 months s/p L dupuytrents release - pt  show still some thick scar tissue worse over 5th MC and into PIP , and MCof 4th - and she lost about 10 degrees of extention to 5th PIP the last month - reinforce importance for pt to use her LMB PIP extention splint on 5th , scar massage and scar pads, and scar massage , ROM - 3-5 x day for another 2 months until about 6 months s/p    OT Occupational Profile and History  Problem Focused Assessment - Including review of records relating to presenting problem    Occupational performance deficits (Please refer to evaluation for details): ADL's;IADL's;Play;Leisure;Social Participation    Body Structure / Function / Physical Skills ADL;Coordination;Edema;Decreased  knowledge of precautions;Flexibility;IADL;Pain;Strength;Scar mobility;ROM;UE functional use    Rehab Potential Good    Clinical Decision Making Limited treatment options, no task modification necessary    Comorbidities Affecting Occupational Performance: None    Modification or Assistance to Complete Evaluation  No modification of tasks or assist necessary to complete eval    OT Frequency Monthly    OT Duration 4 weeks    OT Treatment/Interventions Self-care/ADL training;Moist Heat;Paraffin;Fluidtherapy;Contrast Bath;Therapeutic exercise;Manual Therapy;Passive range of motion;Scar mobilization;Splinting;Patient/family education    Plan assess progres with HEP    OT Home Exercise Plan see pt instruction    Consulted and Agree with Plan of Care Patient           Patient will benefit from skilled therapeutic intervention in order to improve the following deficits and impairments:   Body Structure / Function / Physical Skills: ADL, Coordination, Edema, Decreased knowledge of precautions, Flexibility, IADL, Pain, Strength, Scar mobility, ROM, UE functional use       Visit Diagnosis: Dupuytren's contracture of left hand - Plan: Ot plan of care cert/re-cert  Stiffness of left hand, not elsewhere classified - Plan: Ot plan of care cert/re-cert  Scar condition and fibrosis of skin - Plan: Ot plan of care cert/re-cert    Problem List There are no problems to display for this patient.   Oletta Cohn OTR/L,CLT 02/24/2020, 12:15 PM  [facility] PHYSICAL AND SPORTS MEDICINE [address] [place], [address] Phone: [phone]   Fax:  [phone]  Name: Jillian Ward MRN: [id number] Date of Birth: 12/15/1965

## 2020-03-23 ENCOUNTER — Ambulatory Visit: Attending: Surgery | Admitting: Occupational Therapy

## 2020-03-23 ENCOUNTER — Other Ambulatory Visit: Payer: Self-pay

## 2020-03-23 DIAGNOSIS — M720 Palmar fascial fibromatosis [Dupuytren]: Secondary | ICD-10-CM | POA: Diagnosis present

## 2020-03-23 DIAGNOSIS — M6249 Contracture of muscle, multiple sites: Secondary | ICD-10-CM | POA: Diagnosis present

## 2020-03-23 DIAGNOSIS — M25642 Stiffness of left hand, not elsewhere classified: Secondary | ICD-10-CM | POA: Diagnosis present

## 2020-03-23 DIAGNOSIS — L905 Scar conditions and fibrosis of skin: Secondary | ICD-10-CM | POA: Insufficient documentation

## 2020-03-23 DIAGNOSIS — M79642 Pain in left hand: Secondary | ICD-10-CM | POA: Insufficient documentation

## 2020-03-23 NOTE — Therapy (Signed)
[facility] PH[facility]INE [address] [place], [address] Phone: [phone]   Fax:  [phone]  Occupational Therapy Treatment/discharge  Patient Details  Name: Jillian Ward MRN: [id number] Date of Birth: [birth year] Referring Provider (OT): Dr Joette Catching   Encounter Date: 03/23/2020   OT End of Session - 03/23/20 1134    Visit Number 7    Number of Visits 12    Date for OT Re-Evaluation 03/23/20    OT Start Time 1110    OT Stop Time 1125    OT Time Calculation (min) 15 min    Activity Tolerance Patient tolerated treatment well    Behavior During Therapy [facility] for tasks assessed/performed           Past Medical History:  Diagnosis Date  . Anxiety   . Arthritis    rheumatoid  . Depression   . Hypertension   . Nodule of right lung   . Rib fracture     Past Surgical History:  Procedure Laterality Date  . APPENDECTOMY    . CESAREAN SECTION    . COLONOSCOPY WITH PROPOFOL N/A 01/15/2016   Procedure: COLONOSCOPY WITH PROPOFOL;  Surgeon: Scot Jun, MD;  Location: [facility] ENDOSCOPY;  Service: Endoscopy;  Laterality: N/A;  . 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 HERNIA REPAIR  2005   ABDOMINAL  . HERNIA REPAIR     UMBILICAL    There were no vitals filed for this visit.   Subjective Assessment - 03/23/20 1132    Subjective  I had 2nd shot in my neck -and it is helping some what - pain is now about 5/10 and at the worse 7/10 - NOT 10/10 anymore- I am so glad - finger doing better I think - did like you told me    Pertinent History Pt had L dupuytrens release on 11/03/2019 by Dr Joice Lofts - stitches come out 8/10 and then refer to OT on 12/17/2019 but was unable to get to therapy with death in family    Patient Stated Goals  Don't want my fingers to curl up again , scar better and stiffness better so I can use my hand in yard work - lover being outside    Currently in Pain? Yes    Pain Score 5     Pain Location Neck    Pain Orientation Right              [facility] OT Assessment - 03/23/20 0001      Strength   Right Hand Grip (lbs) NT   tx for neck pain - spasm   Right Hand Lateral Pinch 15 lbs    Right Hand 3 Point Pinch 12 lbs    Left Hand Grip (lbs) 52    Left Hand Lateral Pinch 18 lbs    Left Hand 3 Point Pinch 17 lbs      Left Hand AROM   L Ring  MCP 0-90 90 Degrees   0 ext   L Ring PIP 0-100 100 Degrees    L Little  MCP 0-90 90 Degrees    L Little PIP 0-100 90 Degrees   -40          Pt made progress this past month -and improve her extention back to what it was  2 months ago -prior to neck injury    Scartissue improved in thickness , and tightness in palm -but to cont with scar massage over volar 4th and 5th digits creases And use LMB  splint for PIP extention 3-5 min 3 -5 x day  Tendon glides -blocked 10reps each - but do not force composite - only AROM             OT Education - 03/23/20 1134    Education Details progress and discharge instructions    Person(s) Educated Patient    Methods Explanation;Demonstration;Tactile cues;Verbal cues;Handout    Comprehension Verbal cues required;Returned demonstration;Verbalized understanding            OT Short Term Goals - 02/24/20 1212      OT SHORT TERM GOAL #1   Title Pt to be independent in HEP to increase AROM for flexion , extention at 5th digit , decrease scar tissue adhesion and tenderness to increase ROM    Status Achieved             OT Long Term Goals - 03/23/20 1138      OT LONG TERM GOAL #1   Title L 4th and 5th digit flexion increase to [facility] to touch palm to hold 1 cm objects and hold to stabilze knife while cutting    Status Achieved      OT LONG TERM GOAL #2   Title Extention of 5th PIP improve with  more than 20 degrees to get hand with more ease in pocket and gloves    Baseline PIP extention at 5th PIP -40, MC of 4th WNL    Status Achieved      OT LONG TERM GOAL #3   Title L grip strength increase with 4 lbs to hold and grip tools for garden with no pain    Baseline Grip on L now 52 , R NT because of some neck pain and getting shots    Status Achieved                 Plan - 03/23/20 1135    Clinical Impression Statement Pt  about 5 months s/p L dupuytrens release - pt was seen month again after not seen for month - with cervical issues - did not do her scar massage and splint as much and extention of 5th PIP worsen and MC fo 4th - but this date after doing her HEP again for month - pt show progress to -40 at 5th PIP , full extention of 4th MC - grip 52 lbs and prehension WNL - pt to cont for another few months with scar massage focus on 4th and 5th volar scars over creases , and LMB PIP splint for 5th- discharge at this time - using hand normally    OT Occupational Profile and History Problem Focused Assessment - Including review of records relating to presenting problem    Occupational performance deficits (Please refer to evaluation for details): ADL's;IADL's;Play;Leisure;Social Participation    Body Structure / Function / Physical Skills ADL;Coordination;Edema;Decreased knowledge of precautions;Flexibility;IADL;Pain;Strength;Scar mobility;ROM;UE functional use    Rehab Potential Good    Clinical Decision Making Limited treatment options, no task modification necessary    Comorbidities Affecting Occupational Performance: None    Modification or Assistance to Complete Evaluation  No modification of tasks or assist necessary to complete eval    OT Home Exercise Plan see pt instruction    Consulted and Agree with Plan of Care Patient  Patient will benefit from skilled therapeutic intervention in order to improve the following deficits and impairments:   Body Structure  / Function / Physical Skills: ADL,Coordination,Edema,Decreased knowledge of precautions,Flexibility,IADL,Pain,Strength,Scar mobility,ROM,UE functional use       Visit Diagnosis: Stiffness of left hand, not elsewhere classified  Scar condition and fibrosis of skin  Contracture of muscle, multiple sites  Pain in left hand  Dupuytren's contracture of left hand    Problem List There are no problems to display for this patient.   Oletta Cohn OTR/l,CLT 03/23/2020, 11:40 AM  [facility] PHYSICAL AND SPORTS MEDICINE [address], [place], [address] Phone: [phone]   Fax:  [phone]  Name: Jillian Ward MRN: [id number] Date of Birth: 1965-09-17

## 2020-04-28 ENCOUNTER — Encounter: Payer: Self-pay | Admitting: Occupational Therapy

## 2020-05-22 ENCOUNTER — Encounter: Payer: Self-pay | Admitting: Occupational Therapy

## 2020-05-24 ENCOUNTER — Ambulatory Visit: Attending: Surgery

## 2020-05-24 ENCOUNTER — Other Ambulatory Visit: Payer: Self-pay

## 2020-05-24 DIAGNOSIS — M5412 Radiculopathy, cervical region: Secondary | ICD-10-CM | POA: Insufficient documentation

## 2020-05-24 DIAGNOSIS — M542 Cervicalgia: Secondary | ICD-10-CM | POA: Insufficient documentation

## 2020-05-24 DIAGNOSIS — M25642 Stiffness of left hand, not elsewhere classified: Secondary | ICD-10-CM | POA: Diagnosis present

## 2020-05-24 DIAGNOSIS — M79642 Pain in left hand: Secondary | ICD-10-CM | POA: Insufficient documentation

## 2020-05-24 NOTE — Therapy (Deleted)
[facility] [address] [place], [place], [address] Phone: [phone]   Fax:  [phone]  Physical Therapy Evaluation  Patient Details  Name: Jillian Ward MRN: [id number] Date of Birth: 1965-08-08 Referring Provider (PT): Filomena Jungling   Encounter Date: 05/24/2020   PT End of Session - 05/24/20 1409    Visit Number 1    Number of Visits 17    Date for PT Re-Evaluation 07/19/20    Authorization Type eval: 05/24/20    PT Start Time 1300    PT Stop Time 1350    PT Time Calculation (min) 50 min    Activity Tolerance Patient tolerated treatment well    Behavior During Therapy [facility] for tasks assessed/performed           Past Medical History:  Diagnosis Date  . Anxiety   . Arthritis    rheumatoid  . Depression   . Hypertension   . Nodule of right lung   . Rib fracture     Past Surgical History:  Procedure Laterality Date  . APPENDECTOMY    . CESAREAN SECTION    . COLONOSCOPY WITH PROPOFOL N/A 01/15/2016   Procedure: COLONOSCOPY WITH PROPOFOL;  Surgeon: Scot Jun, MD;  Location: [facility] ENDOSCOPY;  Service: Endoscopy;  Laterality: N/A;  . 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 HERNIA REPAIR  2005   ABDOMINAL  . HERNIA REPAIR     UMBILICAL    There were no vitals filed for this visit.    Subjective Assessment - 05/24/20 1406    Subjective R cervical pain with radicular sxs    Pertinent History Pt complaining of neck pain for the last 6 months. It started about a week after she had been pulling weeds in her garden.  She reports that it started as sharp pain that went down the neck and into the arm and to the elbow. Radicular pain worsens with cervical and shoulder movements. She saw orthopedics who performed 3 rounds  of steroid injections which did not relieve her pain. Her pain is unchanged since onset. Patient diagnosed with RA when she was in her 30s. Pt had a cervical MRI which showed multilevel degenerative changes. There is no  high-grade canal or right foraminal stenosis. 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    Limitations Walking;Standing;House hold activities;Lifting;Reading    Diagnostic tests See history    Patient Stated Goals Decrease neck pain    Currently in Pain? Yes    Pain Score 8    Worst: 10/10, Best: 5/10   Pain Location Neck    Pain Orientation Right    Pain Descriptors / Indicators Aching;Dull    Pain Type Chronic pain    Pain Radiating Towards R elbow    Pain Onset More than a month ago    Pain Frequency Constant    Aggravating Factors  any movement, especially with cervical flexion    Pain Relieving Factors Sitting down. Has not found anything that eases completely.    Effect of Pain on Daily Activities Very limited ADLs            SUBJECTIVE Chief complaint:  R sided neck pain with radiating pain to R elbow Onset: August 2021 Referring Dx: Cervical radiculopathy  Pain: 8/10 Present, 5/10 Best,  10/10 Worst: Aggravating factors: head movement, looking up, sleeping on R side Easing factors:Sitting down. Has not found anything that eases completely.  24 hour pain behavior:  Recent neck trauma: No  Prior history of neck injury or pain: No Pain quality: pain quality: aching Radiating pain: Yes  Numbness/Tingling: No Follow-up appointment with MD: No Dominant hand: right Imaging: Yes     OBJECTIVE  Mental Status Patient is oriented to person, place and time.  Recent memory is intact.  Remote memory is intact.  Attention span and concentration are intact.  Expressive speech is intact.  Patient's fund of knowledge is within normal limits for educational level.  SENSATION: Grossly intact to light touch bilateral UE as determined by testing  dermatomes C2-T2 Proprioception and hot/cold testing deferred on this date   MUSCULOSKELETAL: Tremor: None Bulk: Normal Tone: Normal  Posture Pt demonstrates reasonable posture with minimal slouch.  She has rounded and elevated shoulders at rest.  Some forward head posture.     Palpation Pt is tender to palpation, especially along the inferior, posterior cervical spine.  She is also tender to palpation along the periscapular muscles and coracoid process anteriorly.  Palpation of the 1st rib also elicited pain.  Pain, however, was not described as concordant pain that brought her in to PT.    Strength R/L 4/5 Shoulder flexion (anterior deltoid/pec major/coracobrachialis, axillary n. (C5/6) and musculocutaneous n. (C5-7)) 4/5 Shoulder abduction (deltoid/supraspinatus, axillary/suprascapular n, C5) 5/5 Elbow flexion (biceps brachii, brachialis, brachioradialis, musculoskeletal n, C5/6) 5/5 Elbow extension (triceps, radial n, C7) 5/5 Wrist Extension (C6/7) 5/5 Wrist Flexion (C6/7) 5/5 Finger adduction (interossei, ulnar n, T1) Cervical isometrics are strong and painless in all directions;  AROM R/L 30 Cervical Flexion* 40 Cervical Extension 25/40 Cervical Lateral Flexion 40/60 Cervical Rotation *Indicates pain, overpressure performed unless otherwise indicated  PROM R/L 40 Cervical Flexion* 30/50 Cervical Lateral Flexion 50/70 Cervical Rotation *Indicates pain, overpressure performed unless otherwise indicated  Repeated Movements No centralization or peripheralization of symptoms with repeated cervical protraction and retraction.    Muscle Length Deferred   Passive Accessory Intervertebral Motion (PAIVM) Pt denies reproduction of neck pain with CPA C2-T7.Marland Kitchen  She expressed pain with R UPA at C3 and C4.  Denies pain bilaterally UPA C2 and C4-T7. Generally hypomobile throughout   Reflex Testing Deferred  SPECIAL TESTS Spurlings B (ipsilateral lateral  flexion/contralateral rotation/axial compression): R: Positive L: Negative Distraction Test: Positive  ULTT Median: R: Positive L: Negative   Objective measurements completed on examination: See above findings.      PT Short Term Goals - 05/24/20 1513      PT SHORT TERM GOAL #1   Title Pt will be independent with HEP in order to improve strength and decrease neck pain in order to improve pain-free function at home and work    Time 4    Period Weeks    Status New    Target Date 06/21/20             PT Long Term Goals - 05/24/20 1508      PT LONG TERM GOAL #1   Title Pt will decrease worst neck pain as reported on NPRS by at least 2 points in order to demonstrate clinically significant reduction in neck pain.    Baseline 05/24/20: 10/10    Time 8    Period Weeks    Status New    Target Date 07/19/20      PT LONG TERM GOAL #2   Title  Pt will demonstrate decrease in NDI to at most 19% in order to demonstrate clinically significant reduction in disability related to neck pain    Baseline 05/24/20: 38%    Time 8    Period Weeks    Status New    Target Date 07/19/20      PT LONG TERM GOAL #3   Title Pt wil demonstrate increase in FOTO to at least 59 in order to demonstrate significant improvement in function related to neck pain    Baseline 05/24/20: 47    Time 8    Period Weeks    Status New    Target Date 07/19/20      PT LONG TERM GOAL #4   Title Pt will report at least 75% improvement in her neck pain/disability in order to improve her ability to complete all ADLs/IADLs without increase in pain    Time 8    Status New    Target Date 07/19/20              Plan - 05/24/20 1410    Clinical Impression Statement Pt is a pleasant 55 year-old female referred for neck pain with RUE radicular symptoms that travel to the elbow. She is painful with both cervical AROM and PROM.  She is painful to palpation generally along the posterior cervical spine especially on the right  side and into the upper trap/left shoulder.  However, no palpation reproduces her concordant pain.  She also reports some concordant pain with R sided UPAs along C3 and C4. Positive R Spurlings and ULTT for median nerve.  She does report some relief of her pain with manual cervical distraction.  No focal weakness or sensory deficits identified.  Gross grip strength was equal bilaterally.  Will initiate HEP at first follow-up session. Pt will benefit from skilled PT services to address deficits and return to pain-free function at home and work    Personal Factors and Comorbidities Age;Comorbidity 3+;Past/Current Experience;Time since onset of injury/illness/exacerbation    Comorbidities Anxiety, RA, HTN    Examination-Activity Limitations Sleep    Examination-Participation Restrictions Community Activity;Laundry;Yard Work    Conservation officer, historic buildings Evolving/Moderate complexity    Clinical Decision Making Moderate    Rehab Potential Good    PT Frequency 2x / week    PT Duration 8 weeks    PT Treatment/Interventions Electrical Stimulation;ADLs/Self Care Home Management;Moist Heat;Traction;Therapeutic activities;Therapeutic exercise;Neuromuscular re-education;Manual techniques;Passive range of motion;Dry needling;Joint Manipulations;Spinal Manipulations;Biofeedback;Canalith Repostioning;Cryotherapy;Iontophoresis 4mg /ml Dexamethasone;Ultrasound;Balance training;Patient/family education;Vestibular    PT Next Visit Plan Initiate HEP, STM for TP, manual traction (no mechanical secondary to RA), PROM    PT Home Exercise Plan None currently    Consulted and Agree with Plan of Care Patient            Patient will benefit from skilled therapeutic intervention in order to improve the following deficits and impairments:  Hypomobility,Pain  Visit Diagnosis: Radiculopathy, cervical region - Plan: PT plan of care cert/re-cert  Cervicalgia - Plan: PT plan of care cert/re-cert     Problem  List There are no problems to display for this patient.   This entire session was performed under direct supervision and direction of a licensed therapist/therapist assistant . I have personally read, edited and approve of the note as written.   Aileen Pilot, SPT Lynnea Maizes PT, DPT, GCS  Huprich,Jason 05/25/2020, 5:42 PM  [facility] [address] [place], [place], [address] Phone: [phone]   Fax:  [phone]  Name: Tahira Olivarez Rominger MRN: [id number] Date of Birth: 1965-07-19

## 2020-05-25 NOTE — Therapy (Signed)
[facility] [address] [place], [place], [address] Phone: [phone]   Fax:  [phone]  Physical Therapy Evaluation  Patient Details  Name: Jillian Ward MRN: [id number] Date of Birth: [birth year] Referring Provider (PT): Filomena Jungling   Encounter Date: 05/24/2020   PT End of Session - 05/24/20 1409    Visit Number 1    Number of Visits 17    Date for PT Re-Evaluation 07/19/20    Authorization Type eval: 05/24/20    PT Start Time 1300    PT Stop Time 1350    PT Time Calculation (min) 50 min    Activity Tolerance Patient tolerated treatment well    Behavior During Therapy [facility] for tasks assessed/performed           Past Medical History:  Diagnosis Date  . Anxiety   . Arthritis    rheumatoid  . Depression   . Hypertension   . Nodule of right lung   . Rib fracture     Past Surgical History:  Procedure Laterality Date  . APPENDECTOMY    . CESAREAN SECTION    . COLONOSCOPY WITH PROPOFOL N/A 01/15/2016   Procedure: COLONOSCOPY WITH PROPOFOL;  Surgeon: Scot Jun, MD;  Location: [facility] ENDOSCOPY;  Service: Endoscopy;  Laterality: N/A;  . 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 HERNIA REPAIR  2005   ABDOMINAL  . HERNIA REPAIR     UMBILICAL    There were no vitals filed for this visit.    Subjective Assessment - 05/24/20 1406    Subjective R cervical pain with radicular sxs    Pertinent History Pt complaining of neck pain for the last 6 months. It started about a week after she had been pulling weeds in her garden.  She reports that it started as sharp pain that went down the neck and into the arm and to the elbow. Radicular pain worsens with cervical and shoulder movements. She saw orthopedics who performed 3 rounds  of steroid injections which did not relieve her pain. Her pain is unchanged since onset. Patient diagnosed with RA when she was in her 30s. Pt had a cervical MRI which showed multilevel degenerative changes. There is no  high-grade canal or right foraminal stenosis. 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    Limitations Walking;Standing;House hold activities;Lifting;Reading    Diagnostic tests See history    Patient Stated Goals Decrease neck pain    Currently in Pain? Yes    Pain Score 8    Worst: 10/10, Best: 5/10   Pain Location Neck    Pain Orientation Right    Pain Descriptors / Indicators Aching;Dull    Pain Type Chronic pain    Pain Radiating Towards R elbow    Pain Onset More than a month ago    Pain Frequency Constant    Aggravating Factors  any movement, especially with cervical flexion    Pain Relieving Factors Sitting down. Has not found anything that eases completely.    Effect of Pain on Daily Activities Very limited ADLs              [facility] PT Assessment - 05/25/20 1442      Assessment   Medical Diagnosis Neck and R arm pain    Referring  Provider (PT) Filomena Jungling    Onset Date/Surgical Date 11/22/19    Hand Dominance Right    Next MD Visit Not reported    Prior Therapy None for neck apin      Precautions   Precautions None      Restrictions   Weight Bearing Restrictions No      Home Environment   Living Environment Private residence      Prior Function   Level of Independence Independent      Cognition   Overall Cognitive Status Within Functional Limits for tasks assessed            SUBJECTIVE Chief complaint:  R sided neck pain with radiating pain to R elbow Onset: August 2021 Referring Dx: Cervical radiculopathy  Pain: 8/10 Present, 5/10 Best, 10/10 Worst: Aggravating factors: head movement, looking up, sleeping on R side Easing factors:Sitting down. Has not found anything that eases completely.  Recent neck trauma: No   Prior history of neck injury or pain: No Pain quality: pain quality: aching Radiating pain: Yes  Numbness/Tingling: No Follow-up appointment with MD: No Dominant hand: right Imaging: Yes      OBJECTIVE   Mental Status Patient is oriented to person, place and time.  Recent memory is intact.  Remote memory is intact.  Attention span and concentration are intact.  Expressive speech is intact.  Patient's fund of knowledge is within normal limits for educational level.   SENSATION: Grossly intact to light touch bilateral UE as determined by testing dermatomes C2-T2 Proprioception and hot/cold testing deferred on this date   MUSCULOSKELETAL: Tremor: None Bulk: Normal Tone: Normal   Posture Pt demonstrates reasonable posture with minimal slouch.  She has rounded and elevated shoulders at rest.  Some forward head posture.     Palpation Pt is tender to palpation, especially along the inferior, posterior cervical spine.  She is also tender to palpation along the periscapular muscles and coracoid process anteriorly.  Palpation of the 1st rib also elicited pain.  Pain, however, was not described as concordant pain that brought her in to PT.     Strength R/L 4/5 Shoulder flexion (anterior deltoid/pec major/coracobrachialis, axillary n. (C5/6) and musculocutaneous n. (C5-7)) 4/5 Shoulder abduction (deltoid/supraspinatus, axillary/suprascapular n, C5) 5/5 Elbow flexion (biceps brachii, brachialis, brachioradialis, musculoskeletal n, C5/6) 5/5 Elbow extension (triceps, radial n, C7) 5/5 Wrist Extension (C6/7) 5/5 Wrist Flexion (C6/7) 5/5 Finger adduction (interossei, ulnar n, T1) Cervical isometrics are strong and painless in all directions;   AROM R/L 30 Cervical Flexion* 40 Cervical Extension 25/40 Cervical Lateral Flexion 40/60 Cervical Rotation *Indicates pain, overpressure performed unless otherwise indicated   PROM R/L 40 Cervical Flexion* 30/50 Cervical Lateral  Flexion 50/70 Cervical Rotation *Indicates pain, overpressure performed unless otherwise indicated   Repeated Movements No centralization or peripheralization of symptoms with repeated cervical protraction and retraction.    Muscle Length Deferred   Passive Accessory Intervertebral Motion (PAIVM) Pt denies reproduction of neck pain with CPA C2-T7.Marland Kitchen  She expressed pain with R UPA at C3 and C4.  Denies pain bilaterally UPA C2 and C4-T7. Generally hypomobile throughout     Reflex Testing Deferred   SPECIAL TESTS Spurlings B (ipsilateral lateral flexion/contralateral rotation/axial compression): R: Positive L: Negative Distraction Test: Positive  ULTT Median: R: Positive L: Negative          Objective measurements completed on examination: See above findings.               PT Education - 05/25/20  1441    Education Details Plan of care    Person(s) Educated Patient    Methods Explanation    Comprehension Verbalized understanding            PT Short Term Goals - 05/24/20 1513      PT SHORT TERM GOAL #1   Title Pt will be independent with HEP in order to improve strength and decrease neck pain in order to improve pain-free function at home and work    Time 4    Period Weeks    Status New    Target Date 06/21/20             PT Long Term Goals - 05/24/20 1508      PT LONG TERM GOAL #1   Title Pt will decrease worst neck pain as reported on NPRS by at least 2 points in order to demonstrate clinically significant reduction in neck pain.    Baseline 05/24/20: 10/10    Time 8    Period Weeks    Status New    Target Date 07/19/20      PT LONG TERM GOAL #2   Title Pt will demonstrate decrease in NDI to at most 19% in order to demonstrate clinically significant reduction in disability related to neck pain    Baseline 05/24/20: 38%    Time 8    Period Weeks    Status New    Target Date 07/19/20      PT LONG TERM GOAL #3   Title Pt wil demonstrate  increase in FOTO to at least 59 in order to demonstrate significant improvement in function related to neck pain    Baseline 05/24/20: 47    Time 8    Period Weeks    Status New    Target Date 07/19/20      PT LONG TERM GOAL #4   Title Pt will report at least 75% improvement in her neck pain/disability in order to improve her ability to complete all ADLs/IADLs without increase in pain    Time 8    Status New    Target Date 07/19/20                  Plan - 05/24/20 1410    Clinical Impression Statement Pt is a pleasant 55 year-old female referred for neck pain with RUE radicular symptoms that travel to the elbow. She is painful with both cervical AROM and PROM.  She is painful to palpation generally along the posterior cervical spine especially on the right side and into the upper trap/left shoulder.  However, no palpation reproduces her concordant pain.  She also reports some concordant pain with R sided UPAs along C3 and C4. Positive R Spurlings and ULTT for median nerve.  She does report some relief of her pain with manual cervical distraction.  No focal weakness or sensory deficits identified.  Gross grip strength was equal bilaterally.  Will initiate HEP at first follow-up session. Pt will benefit from skilled PT services to address deficits and return to pain-free function at home and work    Personal Factors and Comorbidities Age;Comorbidity 3+;Past/Current Experience;Time since onset of injury/illness/exacerbation    Comorbidities Anxiety, RA, HTN    Examination-Activity Limitations Sleep    Examination-Participation Restrictions Community Activity;Laundry;Yard Work    Stability/Clinical Decision Making Evolving/Moderate complexity    Clinical Decision Making Moderate    Rehab Potential Good    PT Frequency 2x / week    PT  Duration 8 weeks    PT Treatment/Interventions Electrical Stimulation;ADLs/Self Care Home Management;Moist Heat;Traction;Therapeutic activities;Therapeutic  exercise;Neuromuscular re-education;Manual techniques;Passive range of motion;Dry needling;Joint Manipulations;Spinal Manipulations;Biofeedback;Canalith Repostioning;Cryotherapy;Iontophoresis 4mg /ml Dexamethasone;Ultrasound;Balance training;Patient/family education;Vestibular    PT Next Visit Plan Initiate HEP, STM for TP, manual traction (no mechanical secondary to RA), PROM    PT Home Exercise Plan None currently    Consulted and Agree with Plan of Care Patient           Patient will benefit from skilled therapeutic intervention in order to improve the following deficits and impairments:  Hypomobility,Pain  Visit Diagnosis: Radiculopathy, cervical region - Plan: PT plan of care cert/re-cert  Cervicalgia - Plan: PT plan of care cert/re-cert     Problem List There are no problems to display for this patient.  This entire session was performed under direct supervision and direction of a licensed therapist/therapist assistant . I have personally read, edited and approve of the note as written.   PT, DPT, GCS  Lynnea Maizes SPT Monte Bronder 05/25/2020, 5:44 PM  [facility] [address] [place], [place], [place] Phone: [phone]   Fax:  [phone]  Name: Jillian Ward MRN: [place] [id number] Date of Birth: [birth year]

## 2020-05-30 ENCOUNTER — Ambulatory Visit

## 2020-05-30 ENCOUNTER — Other Ambulatory Visit: Payer: Self-pay

## 2020-05-30 DIAGNOSIS — M79642 Pain in left hand: Secondary | ICD-10-CM

## 2020-05-30 DIAGNOSIS — M5412 Radiculopathy, cervical region: Secondary | ICD-10-CM

## 2020-05-30 DIAGNOSIS — M25642 Stiffness of left hand, not elsewhere classified: Secondary | ICD-10-CM

## 2020-05-30 DIAGNOSIS — M542 Cervicalgia: Secondary | ICD-10-CM

## 2020-05-30 NOTE — Therapy (Signed)
[facility] [address] [place], [place], [address] Phone: [phone]   Fax:  [phone]  Physical Therapy Treatment  Patient Details  Name: Jillian Ward MRN: [id number] Date of Birth: Nov 26, 1965 Referring Provider (PT): Filomena Jungling   Encounter Date: 05/30/2020   PT End of Session - 05/30/20 1343    Visit Number 2    Number of Visits 17    Date for PT Re-Evaluation 07/19/20    Authorization Type eval: 05/24/20    PT Start Time 1345    Activity Tolerance Patient tolerated treatment well    Behavior During Therapy [facility] for tasks assessed/performed           Past Medical History:  Diagnosis Date  . Anxiety   . Arthritis    rheumatoid  . Depression   . Hypertension   . Nodule of right lung   . Rib fracture     Past Surgical History:  Procedure Laterality Date  . APPENDECTOMY    . CESAREAN SECTION    . COLONOSCOPY WITH PROPOFOL N/A 01/15/2016   Procedure: COLONOSCOPY WITH PROPOFOL;  Surgeon: Scot Jun, MD;  Location: [facility] ENDOSCOPY;  Service: Endoscopy;  Laterality: N/A;  . 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 HERNIA REPAIR  2005   ABDOMINAL  . HERNIA REPAIR     UMBILICAL    There were no vitals filed for this visit.   Subjective Assessment - 05/30/20 1342    Subjective R pain 5/10 in the neck radiating to shoulder.  Reports severe aggrevation for about 2 days following evaluation.  Still flared up but not as bad as when she left evaluation.    Pertinent History Pt complaining of neck pain for the last 6 months. It started about a week after she had been pulling weeds in her garden.  She reports that it started as sharp pain that went down the neck and into the arm and to the elbow. Radicular pain worsens  with cervical and shoulder movements. She saw orthopedics who performed 3 rounds of steroid injections which did not relieve her pain. Her pain is unchanged since onset. Patient diagnosed with RA when she was in her 30s. Pt had a cervical MRI which showed multilevel degenerative changes. There is no  high-grade canal or right foraminal stenosis. 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    Limitations Walking;Standing;House hold activities;Lifting;Reading    Diagnostic tests See history    Patient Stated Goals Decrease neck pain    Currently in Pain? No/denies    Pain Score 5     Pain Location Neck    Pain Orientation Right    Pain Descriptors / Indicators Aching;Dull    Pain Type Chronic pain    Pain Onset More than a month ago    Pain Frequency Constant          TREATMENT  Manual Therapy Very gentle STM upper traps, levator, cervical region, suboccipitals and pec minor, patient was extremely sensitive  Median nerve glides x10  Manual cervical traction 3x30s  Cervical CPAs and UPAs C2-T1, grades I/II, 3x30s bouts each level   Periscapular MMT (R/L): Lower traps 3+/3+ Mid traps 3+/3+ Rhomboids 3+/3+   Patient demonstrated interest in PT today.  She noted that she was extremely flared up  since the initial evaluation last week but was looking forward to getting better today.  Patient did not tolerate gentle STM well and stated that she doesn't like massage.  Patient was more irritable to palpation and STM than to mobilization.  She was educated on DDD and what the diagnosis means.  Patient seemed to be very pain focussed, which will be worked on in following sessions.  Patient was not given a HEP today.  Pt will benefit from skilled PT to help with pain and ROM in the neck to progress to a better QOL.             PT Short Term Goals - 05/24/20 1513      PT SHORT TERM GOAL #1   Title Pt will be independent with HEP in order to improve strength and decrease neck pain in  order to improve pain-free function at home and work    Time 4    Period Weeks    Status New    Target Date 06/21/20             PT Long Term Goals - 05/24/20 1508      PT LONG TERM GOAL #1   Title Pt will decrease worst neck pain as reported on NPRS by at least 2 points in order to demonstrate clinically significant reduction in neck pain.    Baseline 05/24/20: 10/10    Time 8    Period Weeks    Status New    Target Date 07/19/20      PT LONG TERM GOAL #2   Title Pt will demonstrate decrease in NDI to at most 19% in order to demonstrate clinically significant reduction in disability related to neck pain    Baseline 05/24/20: 38%    Time 8    Period Weeks    Status New    Target Date 07/19/20      PT LONG TERM GOAL #3   Title Pt wil demonstrate increase in FOTO to at least 59 in order to demonstrate significant improvement in function related to neck pain    Baseline 05/24/20: 47    Time 8    Period Weeks    Status New    Target Date 07/19/20      PT LONG TERM GOAL #4   Title Pt will report at least 75% improvement in her neck pain/disability in order to improve her ability to complete all ADLs/IADLs without increase in pain    Time 8    Status New    Target Date 07/19/20                  Patient will benefit from skilled therapeutic intervention in order to improve the following deficits and impairments:     Visit Diagnosis: Cervicalgia  Radiculopathy, cervical region  Pain in left hand  Stiffness of left hand, not elsewhere classified     Problem List There are no problems to display for this patient.  Aileen Pilot, SPT  Dionisio Paschal 05/30/2020, 1:55 PM  [facility] [address] [place], [place], [address] Phone: [phone]   Fax:  [phone]  Name: Alyissa Whidbee Cygan MRN: [id number] Date of Birth: 1966-03-31

## 2020-06-06 ENCOUNTER — Other Ambulatory Visit: Payer: Self-pay

## 2020-06-06 ENCOUNTER — Ambulatory Visit: Attending: Surgery

## 2020-06-06 DIAGNOSIS — M5412 Radiculopathy, cervical region: Secondary | ICD-10-CM | POA: Insufficient documentation

## 2020-06-06 DIAGNOSIS — M542 Cervicalgia: Secondary | ICD-10-CM | POA: Insufficient documentation

## 2020-06-06 NOTE — Therapy (Signed)
[facility] [address] [place], [place], [address] Phone: [phone]   Fax:  [phone]  Physical Therapy Treatment  Patient Details  Name: Jillian Ward MRN: [id number] Date of Birth: [birth year] Referring Provider (PT): Filomena Jungling   Encounter Date: 06/06/2020   PT End of Session - 06/06/20 1146    Visit Number 3    Number of Visits 17    Date for PT Re-Evaluation 07/19/20    Authorization Type eval: 05/24/20    PT Start Time 1100    PT Stop Time 1145    PT Time Calculation (min) 45 min    Activity Tolerance Patient tolerated treatment well    Behavior During Therapy [employer] [facility] for tasks assessed/performed           Past Medical History:  Diagnosis Date  . Anxiety   . Arthritis    rheumatoid  . Depression   . Hypertension   . Nodule of right lung   . Rib fracture     Past Surgical History:  Procedure Laterality Date  . APPENDECTOMY    . CESAREAN SECTION    . COLONOSCOPY WITH PROPOFOL N/A 01/15/2016   Procedure: COLONOSCOPY WITH PROPOFOL;  Surgeon: Scot Jun, MD;  Location: [facility] ENDOSCOPY;  Service: Endoscopy;  Laterality: N/A;  . 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 HERNIA REPAIR  2005   ABDOMINAL  . HERNIA REPAIR     UMBILICAL    There were no vitals filed for this visit.   Subjective Assessment - 06/06/20 1148    Subjective R pain 5/10 in the neck radiating to shoulder.  She does not report any change since last session.    Pertinent History Pt complaining of neck pain for the last 6 months. It started about a week after she had been pulling weeds in her garden.  She reports that it started as sharp pain that went down the neck and into the arm and to the elbow. Radicular pain worsens with cervical and  shoulder movements. She saw orthopedics who performed 3 rounds of steroid injections which did not relieve her pain. Her pain is unchanged since onset. Patient diagnosed with RA when she was in her 30s. Pt had a cervical MRI which showed multilevel degenerative changes. There is no  high-grade canal or right foraminal stenosis. 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    Limitations Walking;Standing;House hold activities;Lifting;Reading    Diagnostic tests See history    Patient Stated Goals Decrease neck pain    Currently in Pain? Yes    Pain Score 5     Pain Location Neck    Pain Orientation Right    Pain Descriptors / Indicators Aching    Pain Type Chronic pain    Pain Onset More than a month ago    Pain Frequency Constant    Multiple Pain Sites Yes    Pain Score 5    Pain Location Shoulder    Pain Orientation Right    Pain Descriptors / Indicators Aching    Pain Type Chronic pain    Pain Onset More than a month ago    Pain Frequency Constant            TREATMENT   Manual Therapy Very gentle STM upper traps, levator,  cervical region, suboccipitals and R pec minor, patient was extremely sensitive; R ,edian nerve glides x3, discontinued due to pt. discomfort   Prone cervical CPAs and UPAs (bilateral) C2-T1, grades I/II, 3x30s bouts each level; Manual cervical traction, 3x30s bouts;  Shoulder PROM with OP to assess shoulder mobility, no deficits identified and no pain reported;   Ther-Ex  Cervical retraction with OP from therapist, x10    Trigger Point Dry Needling (TDN), unbilled Education performed with patient regarding potential benefit of TDN. Reviewed precautions and risks with patient. Reviewed special precautions/risks over lung fields which include pneumothorax. Reviewed signs and symptoms of pneumothorax and advised pt to go to ER immediately if these symptoms develop advise them of dry needling treatment. Extensive time spent with pt to ensure full  understanding of TDN risks. Pt provided verbal consent to treatment. TDN performed to R upper trap with 2 0.25 x 40 single needle placements with local twitch response (LTR) during both placements. Pistoning technique utilized. Significant decrease in upper trap pain with cervical motion after dry needling.      Patient demonstrated interest in PT today.  She noted that she is still feeling pain with activity.  Patient continues to have a lot of trigger points and is tolerating STM better today. Pt tolerated cervical mobilization well and denied pain with both UPAs and CPAs. Pt was introduced to dry needling in the upper traps today, which she benefited from with respect to decrease in her pain. Patient provided HEP with trap, levator, and lateral flexion stretches as well as repeated cervical retraction. Pt will benefit from skilled PT to help with pain and ROM in the neck to progress to a better QOL.                                PT Short Term Goals - 05/24/20 1513      PT SHORT TERM GOAL #1   Title Pt will be independent with HEP in order to improve strength and decrease neck pain in order to improve pain-free function at home and work    Time 4    Period Weeks    Status New    Target Date 06/21/20             PT Long Term Goals - 05/24/20 1508      PT LONG TERM GOAL #1   Title Pt will decrease worst neck pain as reported on NPRS by at least 2 points in order to demonstrate clinically significant reduction in neck pain.    Baseline 05/24/20: 10/10    Time 8    Period Weeks    Status New    Target Date 07/19/20      PT LONG TERM GOAL #2   Title Pt will demonstrate decrease in NDI to at most 19% in order to demonstrate clinically significant reduction in disability related to neck pain    Baseline 05/24/20: 38%    Time 8    Period Weeks    Status New    Target Date 07/19/20      PT LONG TERM GOAL #3   Title Pt wil demonstrate increase in FOTO to at  least 59 in order to demonstrate significant improvement in function related to neck pain    Baseline 05/24/20: 47    Time 8    Period Weeks    Status New    Target Date 07/19/20  PT LONG TERM GOAL #4   Title Pt will report at least 75% improvement in her neck pain/disability in order to improve her ability to complete all ADLs/IADLs without increase in pain    Time 8    Status New    Target Date 07/19/20                 Plan - 06/06/20 1149    Clinical Impression Statement Patient demonstrated interest in PT today.  She noted that she is still feeling pain with activity.  Patient continues to have a lot of trigger points and is tolerating STM better today. Pt tolerated cervical mobilization well and denied pain with both UPAs and CPAs. Pt was introduced to dry needling in the upper traps today, which she benefited from with respect to decrease in her pain. Patient provided HEP with trap, levator, and lateral flexion stretches as well as repeated cervical retraction. Pt will benefit from skilled PT to help with pain and ROM in the neck to progress to a better QOL.    Personal Factors and Comorbidities Age;Comorbidity 3+;Past/Current Experience;Time since onset of injury/illness/exacerbation    Comorbidities Anxiety, RA, HTN    Examination-Activity Limitations Sleep    Examination-Participation Restrictions Community Activity;Laundry;Yard Work    Conservation officer, historic buildings Evolving/Moderate complexity    Rehab Potential Good    PT Frequency 2x / week    PT Duration 8 weeks    PT Treatment/Interventions Electrical Stimulation;ADLs/Self Care Home Management;Moist Heat;Traction;Therapeutic activities;Therapeutic exercise;Neuromuscular re-education;Manual techniques;Passive range of motion;Dry needling;Joint Manipulations;Spinal Manipulations;Biofeedback;Canalith Repostioning;Cryotherapy;Iontophoresis 4mg /ml Dexamethasone;Ultrasound;Balance training;Patient/family  education;Vestibular    PT Next Visit Plan Initiate HEP, STM for TP, manual traction (no mechanical secondary to RA), PROM    PT Home Exercise Plan None currently    Consulted and Agree with Plan of Care Patient           Patient will benefit from skilled therapeutic intervention in order to improve the following deficits and impairments:  Hypomobility,Pain  Visit Diagnosis: Cervicalgia  Radiculopathy, cervical region     Problem List There are no problems to display for this patient.  This entire session was performed under direct supervision and direction of a licensed therapist/therapist assistant . I have personally read, edited and approve of the note as written.   Aileen Pilot, SPT  Lynnea Maizes PT, DPT, GCS  Huprich,Jason 06/07/2020, 9:46 AM  [facility] [address] [place], [place], [address] Phone: [phone]   Fax:  [phone]  Name: Jillian Ward MRN: [id number] Date of Birth: 09/13/1965

## 2020-06-06 NOTE — Patient Instructions (Signed)
Access Code: [email or web address] URL: [email or web address] Date: 06/06/2020 Prepared by: Ria Comment  Exercises Seated Upper Trapezius Stretch - 2 x daily - 7 x weekly - 3 reps - 45s hold Seated Cervical Sidebending Stretch - 2 x daily - 7 x weekly - 3 reps - 45s hold Seated Levator Scapulae Stretch - 2 x daily - 7 x weekly - 3 reps - 45s hold Supine Cervical Retraction with Towel - 2 x daily - 7 x weekly - 2 sets - 10 reps - 3s hold

## 2020-06-08 ENCOUNTER — Encounter

## 2020-06-13 ENCOUNTER — Ambulatory Visit

## 2020-06-13 ENCOUNTER — Other Ambulatory Visit: Payer: Self-pay

## 2020-06-13 DIAGNOSIS — M542 Cervicalgia: Secondary | ICD-10-CM | POA: Diagnosis not present

## 2020-06-13 NOTE — Therapy (Signed)
[facility] [address] [place], [place], [address] Phone: [phone]   Fax:  [phone]  Physical Therapy Treatment  Patient Details  Name: Jillian Ward MRN: [id number] Date of Birth: 05/22/1965 Referring Provider (PT): Filomena Jungling   Encounter Date: 06/13/2020   PT End of Session - 06/13/20 1114    Visit Number 4    Number of Visits 17    Date for PT Re-Evaluation 07/19/20    Authorization Type eval: 05/24/20    PT Start Time 1115    PT Stop Time 1200    PT Time Calculation (min) 45 min    Activity Tolerance Patient tolerated treatment well    Behavior During Therapy [facility] for tasks assessed/performed           Past Medical History:  Diagnosis Date  . Anxiety   . Arthritis    rheumatoid  . Depression   . Hypertension   . Nodule of right lung   . Rib fracture     Past Surgical History:  Procedure Laterality Date  . APPENDECTOMY    . CESAREAN SECTION    . COLONOSCOPY WITH PROPOFOL N/A 01/15/2016   Procedure: COLONOSCOPY WITH PROPOFOL;  Surgeon: Scot Jun, MD;  Location: [facility] ENDOSCOPY;  Service: Endoscopy;  Laterality: N/A;  . 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 HERNIA REPAIR  2005   ABDOMINAL  . HERNIA REPAIR     UMBILICAL    There were no vitals filed for this visit.   Subjective Assessment - 06/13/20 1114    Subjective Pt arrives reporting 5/10 pain in the neck/R shoulder. She states that it has been more painful over the last couple days. She states that she was sore after the last therapy session from the dry needling but believes that it was helpful. No specific questions currently.    Pertinent History Pt complaining of neck pain for the last 6 months. It started about a week after she had been  pulling weeds in her garden.  She reports that it started as sharp pain that went down the neck and into the arm and to the elbow. Radicular pain worsens with cervical and shoulder movements. She saw orthopedics who performed 3 rounds of steroid injections which did not relieve her pain. Her pain is unchanged since onset. Patient diagnosed with RA when she was in her 30s. Pt had a cervical MRI which showed multilevel degenerative changes. There is no  high-grade canal or right foraminal stenosis. 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    Limitations Walking;Standing;House hold activities;Lifting;Reading    Diagnostic tests See history    Patient Stated Goals Decrease neck pain    Currently in Pain? Yes    Pain Score 5     Pain Location Neck    Pain Orientation Right    Pain Descriptors / Indicators Aching    Pain Type Chronic pain    Pain Onset More than a month ago    Pain Frequency Constant             TREATMENT   Manual Therapy Gentle STM upper traps, levator, cervical paraspinals and suboccipitals; Prone cervical CPAs and UPAs (bilateral) C2-C5, grades I/II, 3x30s bouts each level; Gentle manual cervical traction, 3x30s bouts;  Right upper trap and  lateral flexion stretches 30s hold each;   Ther-Ex  Cervical retraction with OP from therapist x 10    Trigger Point Dry Needling (TDN), unbilled Education performed with patient regarding potential benefit of TDN. Reviewed precautions and risks with patient. Reviewed special precautions/risks over lung fields which include pneumothorax. Reviewed signs and symptoms of pneumothorax and advised pt to go to ER immediately if these symptoms develop advise them of dry needling treatment. Extensive time spent with pt to ensure full understanding of TDN risks. Pt provided verbal consent to treatment. TDN performed to R upper trap with 2, 0.25 x 40 single needle placements with local twitch response (LTR) during both  placements.  Also performed TDN to right cervical multifidus at C3 and right suboccipitals with 2, 0.25 x 40 single needle placements (1 in each location) pistoning technique utilized.     Patient is having increased pain today compared to previous sessions.  She is agreeable to trigger point dry needling again today.  Performed 2 additional placements today given that she was able to tolerate needling during last session without excessive soreness.  He has a significant response especially when needling in the right upper trap.  Patient has a notable jump response with repeated twitches and become slightly diaphoretic. She denies any other symptoms. Performed cervical passive accessory mobilizations and STM during session today as well as stretches. Pt will benefit from skilled PT to help with pain and ROM in the neck to progress to a better QOL.                                  PT Short Term Goals - 05/24/20 1513      PT SHORT TERM GOAL #1   Title Pt will be independent with HEP in order to improve strength and decrease neck pain in order to improve pain-free function at home and work    Time 4    Period Weeks    Status New    Target Date 06/21/20             PT Long Term Goals - 05/24/20 1508      PT LONG TERM GOAL #1   Title Pt will decrease worst neck pain as reported on NPRS by at least 2 points in order to demonstrate clinically significant reduction in neck pain.    Baseline 05/24/20: 10/10    Time 8    Period Weeks    Status New    Target Date 07/19/20      PT LONG TERM GOAL #2   Title Pt will demonstrate decrease in NDI to at most 19% in order to demonstrate clinically significant reduction in disability related to neck pain    Baseline 05/24/20: 38%    Time 8    Period Weeks    Status New    Target Date 07/19/20      PT LONG TERM GOAL #3   Title Pt wil demonstrate increase in FOTO to at least 59 in order to demonstrate significant  improvement in function related to neck pain    Baseline 05/24/20: 47    Time 8    Period Weeks    Status New    Target Date 07/19/20      PT LONG TERM GOAL #4   Title Pt will report at least 75% improvement in her neck pain/disability in order to improve her ability to complete all ADLs/IADLs  without increase in pain    Time 8    Status New    Target Date 07/19/20                 Plan - 06/13/20 1115    Clinical Impression Statement Patient is having increased pain today compared to previous sessions.  She is agreeable to trigger point dry needling again today.  Performed 2 additional placements today given that she was able to tolerate needling during last session without excessive soreness.  He has a significant response especially when needling in the right upper trap.  Patient has a notable jump response with repeated twitches and become slightly diaphoretic. She denies any other symptoms. Performed cervical passive accessory mobilizations and STM during session today as well as stretches. Pt will benefit from skilled PT to help with pain and ROM in the neck to progress to a better QOL.    Personal Factors and Comorbidities Age;Comorbidity 3+;Past/Current Experience;Time since onset of injury/illness/exacerbation    Comorbidities Anxiety, RA, HTN    Examination-Activity Limitations Sleep    Examination-Participation Restrictions Community Activity;Laundry;Yard Work    Conservation officer, historic buildings Evolving/Moderate complexity    Rehab Potential Good    PT Frequency 2x / week    PT Duration 8 weeks    PT Treatment/Interventions Electrical Stimulation;ADLs/Self Care Home Management;Moist Heat;Traction;Therapeutic activities;Therapeutic exercise;Neuromuscular re-education;Manual techniques;Passive range of motion;Dry needling;Joint Manipulations;Spinal Manipulations;Biofeedback;Canalith Repostioning;Cryotherapy;Iontophoresis 4mg /ml Dexamethasone;Ultrasound;Balance  training;Patient/family education;Vestibular    PT Next Visit Plan Initiate HEP, STM for TP, manual traction (no mechanical secondary to RA), PROM    PT Home Exercise Plan None currently    Consulted and Agree with Plan of Care Patient           Patient will benefit from skilled therapeutic intervention in order to improve the following deficits and impairments:  Hypomobility,Pain  Visit Diagnosis: Cervicalgia     Problem List There are no problems to display for this patient.  Croy Drumwright PT, DPT, GCS  Tanner Yeley 06/14/2020, 12:03 PM  [facility] [address] [place], [place], [place] Phone: [phone]   Fax:  [phone]  Name: Ardythe Klute Eisman MRN: [place] [id number] Date of Birth: [birth year]

## 2020-06-15 ENCOUNTER — Other Ambulatory Visit: Payer: Self-pay

## 2020-06-15 ENCOUNTER — Ambulatory Visit

## 2020-06-15 DIAGNOSIS — M542 Cervicalgia: Secondary | ICD-10-CM | POA: Diagnosis not present

## 2020-06-15 NOTE — Therapy (Signed)
[facility] [address]. [place], [place], [address] Phone: [phone]   Fax:  [phone]  Physical Therapy Treatment  Patient Details  Name: Jillian Ward MRN: [id number] Date of Birth: 09-Dec-1965 Referring Provider (PT): Filomena Jungling   Encounter Date: 06/15/2020   PT End of Session - 06/15/20 1127    Visit Number 5    Number of Visits 17    Date for PT Re-Evaluation 07/19/20    Authorization Type eval: 05/24/20    PT Start Time 1117    PT Stop Time 1200    PT Time Calculation (min) 43 min    Activity Tolerance Patient tolerated treatment well    Behavior During Therapy [facility] for tasks assessed/performed           Past Medical History:  Diagnosis Date  . Anxiety   . Arthritis    rheumatoid  . Depression   . Hypertension   . Nodule of right lung   . Rib fracture     Past Surgical History:  Procedure Laterality Date  . APPENDECTOMY    . CESAREAN SECTION    . COLONOSCOPY WITH PROPOFOL N/A 01/15/2016   Procedure: COLONOSCOPY WITH PROPOFOL;  Surgeon: Scot Jun, MD;  Location: [facility] ENDOSCOPY;  Service: Endoscopy;  Laterality: N/A;  . 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 HERNIA REPAIR  2005   ABDOMINAL  . HERNIA REPAIR     UMBILICAL    There were no vitals filed for this visit.   Subjective Assessment - 06/15/20 1124    Subjective Pt arrives reporting 6/10 pain in the neck/R shoulder. She states that it has been sore since the last therapy session from the dry needling. No specific questions currently.    Pertinent History Pt complaining of neck pain for the last 6 months. It started about a week after she had been pulling weeds in her garden.  She reports that it started as sharp pain that went down the neck  and into the arm and to the elbow. Radicular pain worsens with cervical and shoulder movements. She saw orthopedics who performed 3 rounds of steroid injections which did not relieve her pain. Her pain is unchanged since onset. Patient diagnosed with RA when she was in her 30s. Pt had a cervical MRI which showed multilevel degenerative changes. There is no  high-grade canal or right foraminal stenosis. 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    Limitations Walking;Standing;House hold activities;Lifting;Reading    Diagnostic tests See history    Patient Stated Goals Decrease neck pain    Currently in Pain? Yes    Pain Score 6     Pain Location Neck    Pain Orientation Right    Pain Descriptors / Indicators Aching    Pain Type Chronic pain    Pain Onset More than a month ago    Pain Frequency Constant              TREATMENT   Manual Therapy Gentle STM to R upper traps, levator, cervical paraspinals and suboccipitals; Supine cervical CPAs and R UPAs C2-C5, grades I/II, 2 x 20s bouts each level; Gentle manual cervical traction, 3x30s bouts;  Right upper trap, lateral flexion, and levator stretches 30s hold each;   Ther-Ex  Cervical retraction  with OP from therapist x 10; Cervical isometrics for bilateral rotation, bilateral lateral flexion, and flexion with manual resistance from therapist 5s hold x 10 each direction; Supine upper cervical nods x 10;   Pt educated throughout session about proper posture and technique with exercises. Improved exercise technique, movement at target joints, use of target muscles after min to mod verbal, visual, tactile cues.     Patient is having increased pain today secondary to dry needling from last session. Incorporated additional light isometric strengthening today for pain modulation and progression. Performed STM today and she is slightly more tender in the areas near where she received dry needling in the last session.  Incorporated stretches again today for R upper trap, levator, and scalenes. Pt will benefit from skilled PT to help with pain and ROM in the neck to progress to a better QOL.                                  PT Short Term Goals - 05/24/20 1513      PT SHORT TERM GOAL #1   Title Pt will be independent with HEP in order to improve strength and decrease neck pain in order to improve pain-free function at home and work    Time 4    Period Weeks    Status New    Target Date 06/21/20             PT Long Term Goals - 05/24/20 1508      PT LONG TERM GOAL #1   Title Pt will decrease worst neck pain as reported on NPRS by at least 2 points in order to demonstrate clinically significant reduction in neck pain.    Baseline 05/24/20: 10/10    Time 8    Period Weeks    Status New    Target Date 07/19/20      PT LONG TERM GOAL #2   Title Pt will demonstrate decrease in NDI to at most 19% in order to demonstrate clinically significant reduction in disability related to neck pain    Baseline 05/24/20: 38%    Time 8    Period Weeks    Status New    Target Date 07/19/20      PT LONG TERM GOAL #3   Title Pt wil demonstrate increase in FOTO to at least 59 in order to demonstrate significant improvement in function related to neck pain    Baseline 05/24/20: 47    Time 8    Period Weeks    Status New    Target Date 07/19/20      PT LONG TERM GOAL #4   Title Pt will report at least 75% improvement in her neck pain/disability in order to improve her ability to complete all ADLs/IADLs without increase in pain    Time 8    Status New    Target Date 07/19/20                 Plan - 06/15/20 1127    Clinical Impression Statement Patient is having increased pain today secondary to dry needling from last session. Incorporated additional light isometric strengthening today for pain modulation and progression. Performed STM today and she is slightly more tender  in the areas near where she received dry needling in the last session. Incorporated stretches again today for R upper trap, levator, and scalenes. Pt will benefit from skilled PT  to help with pain and ROM in the neck to progress to a better QOL.    Personal Factors and Comorbidities Age;Comorbidity 3+;Past/Current Experience;Time since onset of injury/illness/exacerbation    Comorbidities Anxiety, RA, HTN    Examination-Activity Limitations Sleep    Examination-Participation Restrictions Community Activity;Laundry;Yard Work    Conservation officer, historic buildings Evolving/Moderate complexity    Rehab Potential Good    PT Frequency 2x / week    PT Duration 8 weeks    PT Treatment/Interventions Electrical Stimulation;ADLs/Self Care Home Management;Moist Heat;Traction;Therapeutic activities;Therapeutic exercise;Neuromuscular re-education;Manual techniques;Passive range of motion;Dry needling;Joint Manipulations;Spinal Manipulations;Biofeedback;Canalith Repostioning;Cryotherapy;Iontophoresis 4mg /ml Dexamethasone;Ultrasound;Balance training;Patient/family education;Vestibular    PT Next Visit Plan Initiate HEP, STM for TP, manual traction (no mechanical secondary to RA), PROM    PT Home Exercise Plan None currently    Consulted and Agree with Plan of Care Patient           Patient will benefit from skilled therapeutic intervention in order to improve the following deficits and impairments:  Hypomobility,Pain  Visit Diagnosis: Cervicalgia     Problem List There are no problems to display for this patient.  Haim Hansson PT, DPT, GCS  Arlyne Brandes 06/15/2020, 1:51 PM  [facility] [address] [place], [place], [place] Phone: [phone]   Fax:  [phone]  Name: Jillian Ward MRN: Iran Ouch Date of Birth: 1965/06/29

## 2020-06-20 ENCOUNTER — Other Ambulatory Visit: Payer: Self-pay

## 2020-06-20 ENCOUNTER — Ambulatory Visit

## 2020-06-20 DIAGNOSIS — M542 Cervicalgia: Secondary | ICD-10-CM

## 2020-06-20 NOTE — Therapy (Signed)
[facility] [address] [place], [place], [address] Phone: [phone]   Fax:  [phone]  Physical Therapy Treatment  Patient Details  Name: Jillian Ward MRN: [id number] Date of Birth: 1965-09-14 Referring Provider (PT): Filomena Jungling   Encounter Date: 06/20/2020   PT End of Session - 06/20/20 1121    Visit Number 6    Number of Visits 17    Date for PT Re-Evaluation 07/19/20    Authorization Type eval: 05/24/20    PT Start Time 1115    PT Stop Time 1200    PT Time Calculation (min) 45 min    Activity Tolerance Patient tolerated treatment well    Behavior During Therapy [facility] for tasks assessed/performed           Past Medical History:  Diagnosis Date  . Anxiety   . Arthritis    rheumatoid  . Depression   . Hypertension   . Nodule of right lung   . Rib fracture     Past Surgical History:  Procedure Laterality Date  . APPENDECTOMY    . CESAREAN SECTION    . COLONOSCOPY WITH PROPOFOL N/A 01/15/2016   Procedure: COLONOSCOPY WITH PROPOFOL;  Surgeon: Scot Jun, MD;  Location: [facility] ENDOSCOPY;  Service: Endoscopy;  Laterality: N/A;  . 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 HERNIA REPAIR  2005   ABDOMINAL  . HERNIA REPAIR     UMBILICAL    There were no vitals filed for this visit.   Subjective Assessment - 06/20/20 1119    Subjective Pt arrives reporting 7/10 pain in the neck/R shoulder. She feels like her pain is getting worse and doesn't know if physical therapy is going to help. No specific questions currently.    Pertinent History Pt complaining of neck pain for the last 6 months. It started about a week after she had been pulling weeds in her garden.  She reports that it started as sharp pain that went down  the neck and into the arm and to the elbow. Radicular pain worsens with cervical and shoulder movements. She saw orthopedics who performed 3 rounds of steroid injections which did not relieve her pain. Her pain is unchanged since onset. Patient diagnosed with RA when she was in her 30s. Pt had a cervical MRI which showed multilevel degenerative changes. There is no  high-grade canal or right foraminal stenosis. 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    Limitations Walking;Standing;House hold activities;Lifting;Reading    Diagnostic tests See history    Patient Stated Goals Decrease neck pain    Currently in Pain? Yes    Pain Score 7     Pain Location Neck    Pain Orientation Right    Pain Descriptors / Indicators Aching    Pain Type Chronic pain    Pain Onset More than a month ago    Pain Frequency Constant             TREATMENT   Manual Therapy Gentle STM to R upper traps, levator,cervical paraspinals andsuboccipitals; Supine cervical CPAs and R UPAs C2-C5, grades I/II, 2 x 20s bouts each level; Gentlemanual cervical traction, 3x30s bouts; Right upper trap, lateral flexion, and levator stretches 30shold each;   Ther-Ex  Cervical retraction with OP from therapist  x 10; Cervical isometrics for bilateral rotation, bilateral lateral flexion, and flexion with manual resistance from therapist 5s hold x 10 each direction; Supine upper cervical nods x 10; Supine isometric DNF hold with support removed 5s hold x 10; Standing rows with green tband x 10; Standing shoulder extension with green tband x 10;   Pt educated throughout session about proper posture and technique with exercises. Improved exercise technique, movement at target joints, use of target muscles after min to mod verbal, visual, tactile cues.    Pt is discouraged by her lack of progress with physical therapy. However after conversation regarding prognosis and etiology of her symptoms she  agrees to continue therapy for a while longer to fully assess whether it will be beneficial or not. Incorporated stretches again today for R upper trap, levator, and scalenes. Also incorporated additional upper quarter strengthening exercises.Pt will benefit from skilled PT to help with pain and ROM in the neck to progress to a better QOL.                          PT Short Term Goals - 05/24/20 1513      PT SHORT TERM GOAL #1   Title Pt will be independent with HEP in order to improve strength and decrease neck pain in order to improve pain-free function at home and work    Time 4    Period Weeks    Status New    Target Date 06/21/20             PT Long Term Goals - 05/24/20 1508      PT LONG TERM GOAL #1   Title Pt will decrease worst neck pain as reported on NPRS by at least 2 points in order to demonstrate clinically significant reduction in neck pain.    Baseline 05/24/20: 10/10    Time 8    Period Weeks    Status New    Target Date 07/19/20      PT LONG TERM GOAL #2   Title Pt will demonstrate decrease in NDI to at most 19% in order to demonstrate clinically significant reduction in disability related to neck pain    Baseline 05/24/20: 38%    Time 8    Period Weeks    Status New    Target Date 07/19/20      PT LONG TERM GOAL #3   Title Pt wil demonstrate increase in FOTO to at least 59 in order to demonstrate significant improvement in function related to neck pain    Baseline 05/24/20: 47    Time 8    Period Weeks    Status New    Target Date 07/19/20      PT LONG TERM GOAL #4   Title Pt will report at least 75% improvement in her neck pain/disability in order to improve her ability to complete all ADLs/IADLs without increase in pain    Time 8    Status New    Target Date 07/19/20                 Plan - 06/20/20 1121    Clinical Impression Statement Pt is discouraged by her lack of progress with physical therapy. However after  conversation regarding prognosis and etiology of her symptoms she agrees to continue therapy for a while longer to fully assess whether it will be beneficial or not. Incorporated stretches again today for R upper trap, levator, and  scalenes. Also incorporated additional upper quarter strengthening exercises. Pt will benefit from skilled PT to help with pain and ROM in the neck to progress to a better QOL.    Personal Factors and Comorbidities Age;Comorbidity 3+;Past/Current Experience;Time since onset of injury/illness/exacerbation    Comorbidities Anxiety, RA, HTN    Examination-Activity Limitations Sleep    Examination-Participation Restrictions Community Activity;Laundry;Yard Work    Conservation officer, historic buildings Evolving/Moderate complexity    Rehab Potential Good    PT Frequency 2x / week    PT Duration 8 weeks    PT Treatment/Interventions Electrical Stimulation;ADLs/Self Care Home Management;Moist Heat;Traction;Therapeutic activities;Therapeutic exercise;Neuromuscular re-education;Manual techniques;Passive range of motion;Dry needling;Joint Manipulations;Spinal Manipulations;Biofeedback;Canalith Repostioning;Cryotherapy;Iontophoresis /ml Dexamethasone;Ultrasound;Balance training;Patient/family education;Vestibular    PT Next Visit Plan STM for TP, light manual traction (no mechanical secondary to RA), PROM    PT Home Exercise Plan None currently    Consulted and Agree with Plan of Care Patient           Patient will benefit from skilled therapeutic intervention in order to improve the following deficits and impairments:  Hypomobility,Pain  Visit Diagnosis: Cervicalgia     Problem List There are no problems to display for this patient.  Lynnea Maizes PT, DPT, GCS  Kirsi Hugh 06/21/2020, 12:33 PM  [facility] [address] [place], [place], [address] Phone: [phone]   Fax:  [phone]  Name: Jillian Guttierrez  Ward MRN: [id number] Date of Birth: [birth year]

## 2020-06-22 ENCOUNTER — Encounter

## 2020-06-27 ENCOUNTER — Ambulatory Visit

## 2020-06-27 ENCOUNTER — Other Ambulatory Visit: Payer: Self-pay

## 2020-06-27 DIAGNOSIS — M542 Cervicalgia: Secondary | ICD-10-CM | POA: Diagnosis not present

## 2020-06-27 NOTE — Therapy (Signed)
[facility] [address] [place], [place], [address] Phone: [phone]   Fax:  [phone]  Physical Therapy Treatment  Patient Details  Name: Jillian Ward MRN: [id number] Date of Birth: Jun 06, 1965 Referring Provider (PT): Filomena Jungling   Encounter Date: 06/27/2020   PT End of Session - 06/27/20 1457    Visit Number 7    Number of Visits 17    Date for PT Re-Evaluation 07/19/20    Authorization Type eval: 05/24/20    PT Start Time 1110    PT Stop Time 1155    PT Time Calculation (min) 45 min    Activity Tolerance Patient tolerated treatment well    Behavior During Therapy [facility] for tasks assessed/performed           Past Medical History:  Diagnosis Date  . Anxiety   . Arthritis    rheumatoid  . Depression   . Hypertension   . Nodule of right lung   . Rib fracture     Past Surgical History:  Procedure Laterality Date  . APPENDECTOMY    . CESAREAN SECTION    . COLONOSCOPY WITH PROPOFOL N/A 01/15/2016   Procedure: COLONOSCOPY WITH PROPOFOL;  Surgeon: Scot Jun, MD;  Location: [facility] ENDOSCOPY;  Service: Endoscopy;  Laterality: N/A;  . 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 HERNIA REPAIR  2005   ABDOMINAL  . HERNIA REPAIR     UMBILICAL    There were no vitals filed for this visit.   Subjective Assessment - 06/27/20 1114    Subjective Pt arrives reporting 5/10 pain in the neck/R shoulder. She is complaining of some new onset bilateral finger tingling/numbness over the course of the last week. No specific questions currently.    Pertinent History Pt complaining of neck pain for the last 6 months. It started about a week after she had been pulling weeds in her garden.  She reports that it started as sharp pain that  went down the neck and into the arm and to the elbow. Radicular pain worsens with cervical and shoulder movements. She saw orthopedics who performed 3 rounds of steroid injections which did not relieve her pain. Her pain is unchanged since onset. Patient diagnosed with RA when she was in her 30s. Pt had a cervical MRI which showed multilevel degenerative changes. There is no  high-grade canal or right foraminal stenosis. 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    Limitations Walking;Standing;House hold activities;Lifting;Reading    Diagnostic tests See history    Patient Stated Goals Decrease neck pain    Currently in Pain? Yes    Pain Score 5     Pain Location Neck    Pain Orientation Right    Pain Descriptors / Indicators Aching    Pain Type Chronic pain    Pain Onset More than a month ago    Pain Frequency Constant              TREATMENT   Manual Therapy Gentle STM to bilateral upper traps, levator,cervical paraspinals andsuboccipitals; Supine cervical CPAs and R UPAs C2-C5, grades I/II, 2 x 20s bouts each level; Gentlemanual cervical traction, 5x30s bouts; R first rib mobilizations, grade II, 30s/bout x 2 bouts; Bilateral upper trap and lateral flexion stretches x 30shold each;  Ther-Ex  Cervical retraction with OP from therapist x 10; Cervical isometrics for bilateral rotation, bilateral lateral flexion, and flexion with manual resistance from therapist 5s hold x 10 each direction; Supine upper cervical nods x 10; Standing Nautilus rows 50# 2 x 10; Standing Nautilus chest press 50# 2 x 10;   Pt educated throughout session about proper posture and technique with exercises. Improved exercise technique, movement at target joints, use of target muscles after min to mod verbal, visual, tactile cues.    Performed additional manual traction with patient today due to new onset bilateral hand tingling/numbness. Pt encouraged to notify MD if these  symptoms continued. Also incorporated stretches again today for R upper trap and scalenes. Progressed to standing Nautilus strengthening for both rows and chest press. Pt will benefit from skilled PT to help with pain and ROM in the neck to progress to a better QOL.                               PT Short Term Goals - 05/24/20 1513      PT SHORT TERM GOAL #1   Title Pt will be independent with HEP in order to improve strength and decrease neck pain in order to improve pain-free function at home and work    Time 4    Period Weeks    Status New    Target Date 06/21/20             PT Long Term Goals - 05/24/20 1508      PT LONG TERM GOAL #1   Title Pt will decrease worst neck pain as reported on NPRS by at least 2 points in order to demonstrate clinically significant reduction in neck pain.    Baseline 05/24/20: 10/10    Time 8    Period Weeks    Status New    Target Date 07/19/20      PT LONG TERM GOAL #2   Title Pt will demonstrate decrease in NDI to at most 19% in order to demonstrate clinically significant reduction in disability related to neck pain    Baseline 05/24/20: 38%    Time 8    Period Weeks    Status New    Target Date 07/19/20      PT LONG TERM GOAL #3   Title Pt wil demonstrate increase in FOTO to at least 59 in order to demonstrate significant improvement in function related to neck pain    Baseline 05/24/20: 47    Time 8    Period Weeks    Status New    Target Date 07/19/20      PT LONG TERM GOAL #4   Title Pt will report at least 75% improvement in her neck pain/disability in order to improve her ability to complete all ADLs/IADLs without increase in pain    Time 8    Status New    Target Date 07/19/20                 Plan - 06/27/20 1457    Clinical Impression Statement Performed additional manual traction with patient today due to new onset bilateral hand tingling/numbness. Pt encouraged to notify MD if these  symptoms continued. Also incorporated stretches again today for R upper trap and scalenes. Progressed to standing Nautilus strengthening for both rows and chest press. Pt will benefit from skilled PT to help with pain and ROM in the neck  to progress to a better QOL.    Personal Factors and Comorbidities Age;Comorbidity 3+;Past/Current Experience;Time since onset of injury/illness/exacerbation    Comorbidities Anxiety, RA, HTN    Examination-Activity Limitations Sleep    Examination-Participation Restrictions Community Activity;Laundry;Yard Work    Conservation officer, historic buildings Evolving/Moderate complexity    Rehab Potential Good    PT Frequency 2x / week    PT Duration 8 weeks    PT Treatment/Interventions Electrical Stimulation;ADLs/Self Care Home Management;Moist Heat;Traction;Therapeutic activities;Therapeutic exercise;Neuromuscular re-education;Manual techniques;Passive range of motion;Dry needling;Joint Manipulations;Spinal Manipulations;Biofeedback;Canalith Repostioning;Cryotherapy;Iontophoresis 4mg /ml Dexamethasone;Ultrasound;Balance training;Patient/family education;Vestibular    PT Next Visit Plan STM for TP, light manual traction (no mechanical secondary to RA), PROM    PT Home Exercise Plan None currently    Consulted and Agree with Plan of Care Patient           Patient will benefit from skilled therapeutic intervention in order to improve the following deficits and impairments:  Hypomobility,Pain  Visit Diagnosis: Cervicalgia     Problem List There are no problems to display for this patient.  Dennise Raabe PT, DPT, GCS  Cherissa Hook 06/27/2020, 3:13 PM  [facility] [address] [place], [place], [place] Phone: [phone]   Fax:  [phone]  Name: Jillian Ward MRN: [place] Ouch Date of Birth: 10/09/1965

## 2020-06-28 ENCOUNTER — Ambulatory Visit

## 2020-06-28 DIAGNOSIS — M542 Cervicalgia: Secondary | ICD-10-CM

## 2020-06-28 NOTE — Therapy (Signed)
[facility] [address] [place], [place], [address] Phone: [phone]   Fax:  [phone]  Physical Therapy Treatment  Patient Details  Name: Jillian Ward MRN: [id number] Date of Birth: 12-Jan-1966 Referring Provider (PT): Filomena Jungling   Encounter Date: 06/28/2020   PT End of Session - 06/28/20 1305    Visit Number 8    Number of Visits 17    Date for PT Re-Evaluation 07/19/20    Authorization Type eval: 05/24/20    PT Start Time 1300    PT Stop Time 1345    PT Time Calculation (min) 45 min    Activity Tolerance Patient tolerated treatment well    Behavior During Therapy [facility] for tasks assessed/performed           Past Medical History:  Diagnosis Date  . Anxiety   . Arthritis    rheumatoid  . Depression   . Hypertension   . Nodule of right lung   . Rib fracture     Past Surgical History:  Procedure Laterality Date  . APPENDECTOMY    . CESAREAN SECTION    . COLONOSCOPY WITH PROPOFOL N/A 01/15/2016   Procedure: COLONOSCOPY WITH PROPOFOL;  Surgeon: Scot Jun, MD;  Location: [facility] ENDOSCOPY;  Service: Endoscopy;  Laterality: N/A;  . 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 HERNIA REPAIR  2005   ABDOMINAL  . HERNIA REPAIR     UMBILICAL    There were no vitals filed for this visit.   Subjective Assessment - 06/28/20 1303    Subjective Pt arrives reporting 4/10 pain in the neck/R shoulder. She is reporting continued bilateral finger tingling/numbness. No specific questions currently.    Pertinent History Pt complaining of neck pain for the last 6 months. It started about a week after she had been pulling weeds in her garden.  She reports that it started as sharp pain that went down the neck and into the arm and to  the elbow. Radicular pain worsens with cervical and shoulder movements. She saw orthopedics who performed 3 rounds of steroid injections which did not relieve her pain. Her pain is unchanged since onset. Patient diagnosed with RA when she was in her 30s. Pt had a cervical MRI which showed multilevel degenerative changes. There is no  high-grade canal or right foraminal stenosis. 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    Limitations Walking;Standing;House hold activities;Lifting;Reading    Diagnostic tests See history    Patient Stated Goals Decrease neck pain    Currently in Pain? Yes    Pain Score 4     Pain Location Neck    Pain Orientation Right    Pain Descriptors / Indicators Aching    Pain Type Chronic pain    Pain Onset More than a month ago    Pain Frequency Constant                TREATMENT   Manual Therapy Supine gentle STM to bilateral upper traps, levator,cervical paraspinals andsuboccipitals to decrease pain and improve tissue extensibility Supine cervical CPAs and R UPAs C2-C5, grades I/II, 2 x 20s bouts each level for pain modulation; Supine gentlemanual cervical traction, 5x30s bouts; R first rib mobilizations, grade II, 30s/bout x 2 bouts; Bilateral upper trap and lateral flexion  stretches x 30shold each; Suboccipital release x2 minutes; Suboccipital stretch x30 seconds;   Ther-Ex  UBE x 4 minutes for warm up at start of session (2 minutes forward/2 minutes backwards); Cervical retraction with OP from therapist x 10; Cervical isometrics for bilateral rotation, bilateral lateral flexion, and flexion with manual resistance from therapist 5s hold x 10 each direction; Supine upper cervical nods x 10;   Pt educated throughout session about proper posture and technique with exercises. Improved exercise technique, movement at target joints, use of target muscles after min to mod verbal, visual, tactile cues.    Continued with cervical  manual techniques including cervical mobilizations soft tissue mobilization to modulate pain and improve tissue extensibility.  She reports persistent bilateral hand numbness and patient encouraged to follow-up with her MD regarding this concern if symptoms persist..  Repeated cervical strengthening including strengthening for deep neck flexors.  Also incorporated stretches again today for R upper trap and scalenes. Pt will benefit from skilled PT to help with pain and ROM in the neck to progress to a better QOL.                               PT Short Term Goals - 05/24/20 1513      PT SHORT TERM GOAL #1   Title Pt will be independent with HEP in order to improve strength and decrease neck pain in order to improve pain-free function at home and work    Time 4    Period Weeks    Status New    Target Date 06/21/20             PT Long Term Goals - 05/24/20 1508      PT LONG TERM GOAL #1   Title Pt will decrease worst neck pain as reported on NPRS by at least 2 points in order to demonstrate clinically significant reduction in neck pain.    Baseline 05/24/20: 10/10    Time 8    Period Weeks    Status New    Target Date 07/19/20      PT LONG TERM GOAL #2   Title Pt will demonstrate decrease in NDI to at most 19% in order to demonstrate clinically significant reduction in disability related to neck pain    Baseline 05/24/20: 38%    Time 8    Period Weeks    Status New    Target Date 07/19/20      PT LONG TERM GOAL #3   Title Pt wil demonstrate increase in FOTO to at least 59 in order to demonstrate significant improvement in function related to neck pain    Baseline 05/24/20: 47    Time 8    Period Weeks    Status New    Target Date 07/19/20      PT LONG TERM GOAL #4   Title Pt will report at least 75% improvement in her neck pain/disability in order to improve her ability to complete all ADLs/IADLs without increase in pain    Time 8    Status New     Target Date 07/19/20                 Plan - 06/28/20 1305    Clinical Impression Statement Continued with cervical manual techniques including cervical mobilizations soft tissue mobilization to modulate pain and improve tissue extensibility.  She reports persistent bilateral hand numbness and patient encouraged to  follow-up with her MD regarding this concern if symptoms persist..  Repeated cervical strengthening including strengthening for deep neck flexors.  Also incorporated stretches again today for R upper trap and scalenes. Pt will benefit from skilled PT to help with pain and ROM in the neck to progress to a better QOL.    Personal Factors and Comorbidities Age;Comorbidity 3+;Past/Current Experience;Time since onset of injury/illness/exacerbation    Comorbidities Anxiety, RA, HTN    Examination-Activity Limitations Sleep    Examination-Participation Restrictions Community Activity;Laundry;Yard Work    Conservation officer, historic buildings Evolving/Moderate complexity    Rehab Potential Good    PT Frequency 2x / week    PT Duration 8 weeks    PT Treatment/Interventions Electrical Stimulation;ADLs/Self Care Home Management;Moist Heat;Traction;Therapeutic activities;Therapeutic exercise;Neuromuscular re-education;Manual techniques;Passive range of motion;Dry needling;Joint Manipulations;Spinal Manipulations;Biofeedback;Canalith Repostioning;Cryotherapy;Iontophoresis 4mg /ml Dexamethasone;Ultrasound;Balance training;Patient/family education;Vestibular    PT Next Visit Plan STM for TP, light manual traction (no mechanical secondary to RA), PROM    PT Home Exercise Plan Access Code: [email or web address]    Consulted and Agree with Plan of Care Patient           Patient will benefit from skilled therapeutic intervention in order to improve the following deficits and impairments:  Hypomobility,Pain  Visit Diagnosis: Cervicalgia     Problem List There are no problems to display for this  patient.  Jillian Ward PT, DPT, GCS  Jillian Ward 06/29/2020, 2:15 PM  [facility] [address] [place], [place], [address] Phone: [phone]   Fax:  [phone]  Name: Jillian Ward MRN: [id number] Date of Birth: 1965-12-27

## 2020-06-28 NOTE — Patient Instructions (Signed)
Access Code: [email or web address] URL: [email or web address] Date: 06/28/2020 Prepared by: Ria Comment  Exercises Supine Chin Tuck - 1 x daily - 7 x weekly - 2 sets - 10 reps - 5s hold Seated Levator Scapulae Stretch - 1 x daily - 7 x weekly - 3 sets - 3 reps Seated Upper Trapezius Stretch - 1 x daily - 7 x weekly - 3 sets - 3 reps - 30s hold Seated Cervical Sidebending Stretch - 1 x daily - 7 x weekly - 3 sets - 3 reps - 30s hold Standing Row with Resistance - 1 x daily - 7 x weekly - 2 sets - 10 reps - 5s hold

## 2020-06-29 ENCOUNTER — Encounter

## 2020-07-04 ENCOUNTER — Ambulatory Visit

## 2020-07-06 ENCOUNTER — Encounter

## 2020-07-10 NOTE — Patient Instructions (Addendum)
Access Code: [email or web address] URL: [email or web address] Date: 07/11/2020 Prepared by: Ria Comment  Exercises Seated Levator Scapulae Stretch - 1 x daily - 7 x weekly - 3 sets - 3 reps - 30s hold Seated Upper Trapezius Stretch - 1 x daily - 7 x weekly - 3 sets - 3 reps - 30s hold Seated Cervical Sidebending Stretch - 1 x daily - 7 x weekly - 3 sets - 3 reps - 30s hold Supine Chin Tuck - 1 x daily - 4 x weekly - 2 sets - 10 reps - 5s hold Standing Row with Resistance - 1 x daily - 4 x weekly - 2 sets - 10 reps - 5s hold Single Arm Shoulder Extension with Anchored Resistance - 1 x daily - 4 x weekly - 2 sets - 10 reps - 5s hold Wall Push Up - 1 x daily - 4 x weekly - 2 sets - 10 reps

## 2020-07-11 ENCOUNTER — Other Ambulatory Visit: Payer: Self-pay

## 2020-07-11 ENCOUNTER — Ambulatory Visit: Attending: Surgery

## 2020-07-11 DIAGNOSIS — M542 Cervicalgia: Secondary | ICD-10-CM | POA: Diagnosis present

## 2020-07-11 NOTE — Therapy (Addendum)
[facility] [address] [place], [place], [address] Phone: [phone]   Fax:  [phone]  Physical Therapy Treatment/Discharge  Patient Details  Name: Jillian Ward MRN: [id number] Date of Birth: September 29, 1965 Referring Provider (PT): Girtha Hake   Encounter Date: 07/11/2020   PT End of Session - 07/11/20 1212    Visit Number 9    Number of Visits 17    Date for PT Re-Evaluation 07/19/20    Authorization Type eval: 05/24/20    PT Start Time 1148    PT Stop Time 1230    PT Time Calculation (min) 42 min    Activity Tolerance Patient tolerated treatment well    Behavior During Therapy [facility] for tasks assessed/performed           Past Medical History:  Diagnosis Date  . Anxiety   . Arthritis    rheumatoid  . Depression   . Hypertension   . Nodule of right lung   . Rib fracture     Past Surgical History:  Procedure Laterality Date  . APPENDECTOMY    . CESAREAN SECTION    . COLONOSCOPY WITH PROPOFOL N/A 01/15/2016   Procedure: COLONOSCOPY WITH PROPOFOL;  Surgeon: Manya Silvas, MD;  Location: [facility] ENDOSCOPY;  Service: Endoscopy;  Laterality: N/A;  . 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 HERNIA REPAIR  2005   ABDOMINAL  . HERNIA REPAIR     UMBILICAL    There were no vitals filed for this visit.   Subjective Assessment - 07/11/20 1152    Subjective Pt arrives reporting 3/10 pain in the neck/R shoulder upon arrival. She had a tooth extracted since her last therapy appointment which distracted her from her shoulder pain. She is reporting improved bilateral finger tingling/numbness. No specific questions currently.    Pertinent History Pt complaining of neck pain for the last 6 months. It started about a week after she had  been pulling weeds in her garden.  She reports that it started as sharp pain that went down the neck and into the arm and to the elbow. Radicular pain worsens with cervical and shoulder movements. She saw orthopedics who performed 3 rounds of steroid injections which did not relieve her pain. Her pain is unchanged since onset. Patient diagnosed with RA when she was in her 71s. Pt had a cervical MRI which showed multilevel degenerative changes. There is no  high-grade canal or right foraminal stenosis. 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    Limitations Walking;Standing;House hold activities;Lifting;Reading    Diagnostic tests See history    Patient Stated Goals Decrease neck pain    Currently in Pain? Yes    Pain Score 3     Pain Location Shoulder    Pain Orientation Right    Pain Descriptors / Indicators Aching    Pain Type Chronic pain    Pain Onset More than a month ago    Pain Frequency Constant              TREATMENT   Ther-Ex  UBE x 4 minutes for warm up at start of session during history (2 minutes forward/2 minutes backwards); Standing low rows with green tband 2 x 15; Standing shoulder extension with green tband 2 x 15; Nautilus lat pull down  40# 2 x 15;  Updated outcome measures: Worst pain: 3/10 NDI: 18% FOTO: 53  Supine cervical retraction x 10; Supine cervical strengthening bilateral rotation, bilateral lateral flexion, and extension with manual resistance from therapist 5s hold x 10 each direction; Supine upper cervical nods x 10; Supine deep neck flexor holds 5s x 5; Updated and reviewed HEP with patient, new handout provided. Discharge instructions provided;   Pt educated throughout session about proper posture and technique with exercises. Improved exercise technique, movement at target joints, use of target muscles after min to mod verbal, visual, tactile cues.   Updated outcome measures with patient today.  Overall she reports  approximate 80% provement her symptoms starting with therapy.  Her worst pain at initial evaluation was 10/10 and today she reports it has been 3/10 at worst over the last few days. Her Neck Disability Index score decreased from 38% initial evaluation to 18% today and her FOTO score improved from 47 to 53.  Patient reports that she is ready to discharge from therapy at this time.  Updated HEP with patient today and encouraged continued activity and strengthening.  If symptoms recur or worsen patient encouraged to follow-up with therapist.  Patient is being discharged on this date having reported satisfactory resolution of her symptoms.                       PT Education - 07/11/20 1422    Education Details Discharge and updated HEP    Person(s) Educated Patient    Methods Explanation    Comprehension Verbalized understanding            PT Short Term Goals - 07/11/20 1429      PT SHORT TERM GOAL #1   Title Pt will be independent with HEP in order to improve strength and decrease neck pain in order to improve pain-free function at home and work    Time 4    Period Weeks    Status Achieved    Target Date 06/21/20             PT Long Term Goals - 07/11/20 1203      PT LONG TERM GOAL #1   Title Pt will decrease worst neck pain as reported on NPRS by at least 2 points in order to demonstrate clinically significant reduction in neck pain.    Baseline 05/24/20: 10/10; 07/11/20: 3/10;    Time 8    Period Weeks    Status Achieved      PT LONG TERM GOAL #2   Title Pt will demonstrate decrease in NDI to at most 19% in order to demonstrate clinically significant reduction in disability related to neck pain    Baseline 05/24/20: 38%; 07/11/20: 18%    Time 8    Period Weeks    Status Achieved      PT LONG TERM GOAL #3   Title Pt wil demonstrate increase in FOTO to at least 59 in order to demonstrate significant improvement in function related to neck pain    Baseline  05/24/20: 47; 07/11/20: 53    Time 8    Period Weeks    Status Partially Met      PT LONG TERM GOAL #4   Title Pt will report at least 75% improvement in her neck pain/disability in order to improve her ability to complete all ADLs/IADLs without increase in pain    Baseline 07/11/20: 80% improved    Time 8  Period Weeks    Status Achieved                 Plan - 07/11/20 1212    Clinical Impression Statement Updated outcome measures with patient today.  Overall she reports approximate 80% provement her symptoms starting with therapy.  Her worst pain at initial evaluation was 10/10 and today she reports it has been 3/10 at worst over the last few days. Her Neck Disability Index score decreased from 38% initial evaluation to 18% today and her FOTO score improved from 47 to 53.  Patient reports that she is ready to discharge from therapy at this time.  Updated HEP with patient today and encouraged continued activity and strengthening.  If symptoms recur or worsen patient encouraged to follow-up with therapist.  Patient is being discharged on this date having reported satisfactory resolution of her symptoms.    Personal Factors and Comorbidities Age;Comorbidity 3+;Past/Current Experience;Time since onset of injury/illness/exacerbation    Comorbidities Anxiety, RA, HTN    Examination-Activity Limitations Sleep    Examination-Participation Restrictions Community Activity;Laundry;Yard Work    Merchant navy officer Evolving/Moderate complexity    Rehab Potential Good    PT Frequency 2x / week    PT Duration 8 weeks    PT Treatment/Interventions Electrical Stimulation;ADLs/Self Care Home Management;Moist Heat;Traction;Therapeutic activities;Therapeutic exercise;Neuromuscular re-education;Manual techniques;Passive range of motion;Dry needling;Joint Manipulations;Spinal Manipulations;Biofeedback;Canalith Repostioning;Cryotherapy;Iontophoresis 2m/ml Dexamethasone;Ultrasound;Balance  training;Patient/family education;Vestibular    PT Next Visit Plan Discharge    PT Home Exercise Plan Access Code: [email or web address]   Consulted and Agree with Plan of Care Patient           Patient will benefit from skilled therapeutic intervention in order to improve the following deficits and impairments:  Hypomobility,Pain  Visit Diagnosis: Cervicalgia     Problem List There are no problems to display for this patient.  JLyndel SafeHuprich PT, DPT, GCS  Jillian Ward 07/11/2020, 2:30 PM  [place] [place] [address]Phone: [phone]  Fax:  [phone] Name: Jillian MantheSLaguardiaMRN: [id number] of Birth: 4Apr 24, 1967

## 2020-07-13 ENCOUNTER — Ambulatory Visit

## 2020-09-25 ENCOUNTER — Other Ambulatory Visit: Payer: Self-pay | Admitting: Physical Medicine & Rehabilitation

## 2020-09-25 DIAGNOSIS — M542 Cervicalgia: Secondary | ICD-10-CM

## 2020-10-05 ENCOUNTER — Ambulatory Visit
Admission: RE | Admit: 2020-10-05 | Discharge: 2020-10-05 | Disposition: A | Discharge status: Home / Self Care | Source: Ambulatory Visit | Attending: Physical Medicine & Rehabilitation | Admitting: Physical Medicine & Rehabilitation

## 2020-10-05 ENCOUNTER — Other Ambulatory Visit: Payer: Self-pay

## 2020-10-05 DIAGNOSIS — M542 Cervicalgia: Secondary | ICD-10-CM | POA: Insufficient documentation

## 2020-10-05 IMAGING — MR MR CERVICAL SPINE W/O CM
6 series · 39 of 48 positions shown · non-contrast
Comparison: Cervical spine MRI [DATE]

CLINICAL DATA: Neck pain with bilateral arm pain.

EXAM:
MRI CERVICAL SPINE WITHOUT CONTRAST
TECHNIQUE: Multiplanar, multisequence MR imaging of the cervical spine was
performed. No intravenous contrast was administered.

[Series 5: T2 · sagittal · 3.0mm · 0.62mm/px · 6 of 15 slices shown (1 of 2)]
[im 1/15]
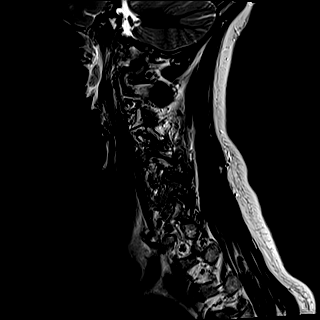
[im 3/15]
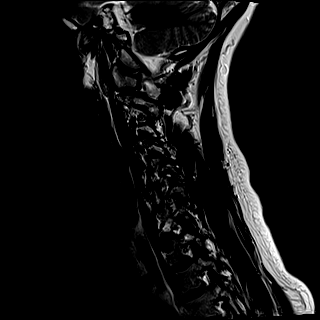
[im 6/15]
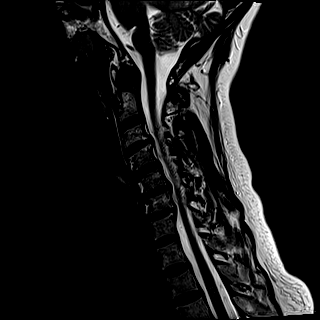
[im 9/15]
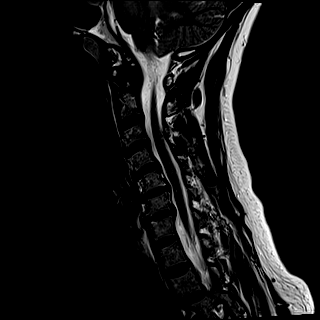
[im 12/15]
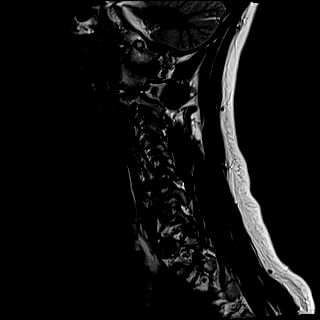
[im 15/15]
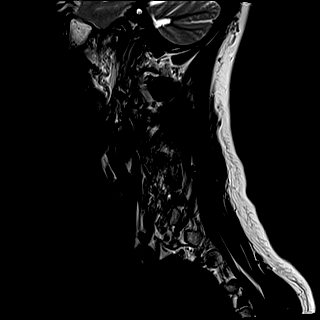

[Series 6: FLAIR · sagittal · 3.0mm · 0.78mm/px · 6 of 15 slices shown]
[im 1/15]
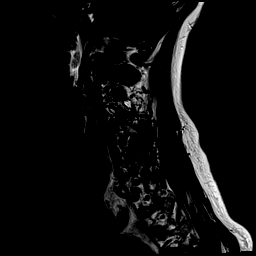
[im 3/15]
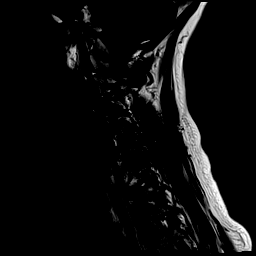
[im 6/15]
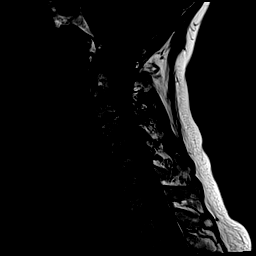
[im 9/15]
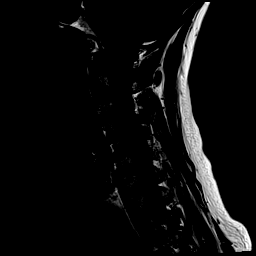
[im 12/15]
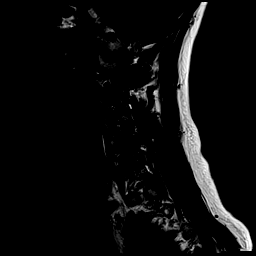
[im 15/15]
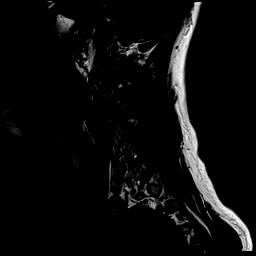

[Series 7: STIR · sagittal · 3.0mm · 0.62mm/px · 6 of 15 slices shown (1 of 2)]
[im 1/15]
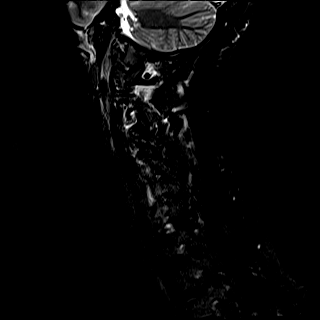
[im 3/15]
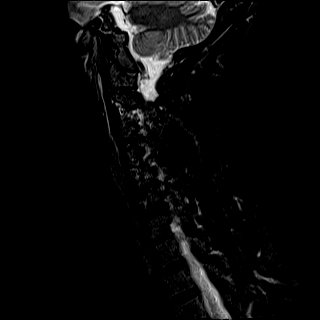
[im 6/15]
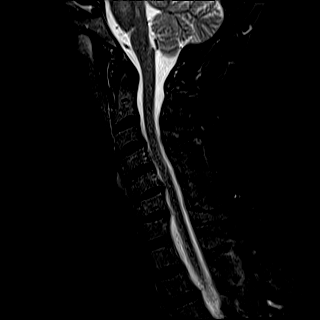
[im 9/15]
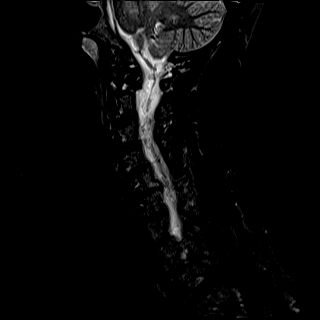
[im 12/15]
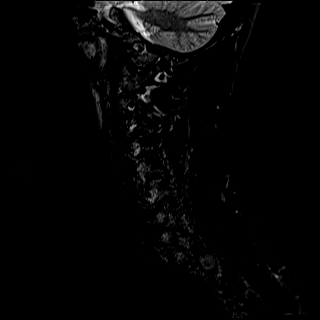
[im 15/15]
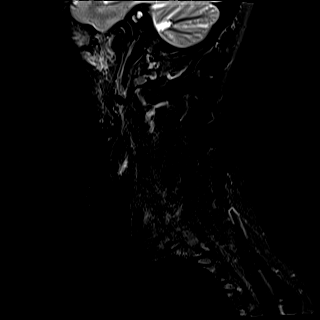

[Series 8: T2 · axial · 3.0mm · 0.70mm/px · z∈[-138,-42]mm · 9 of 29 slices shown (2 of 2)]
[im 1/29]
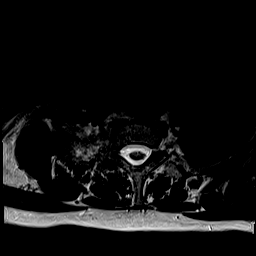
[im 6/29]
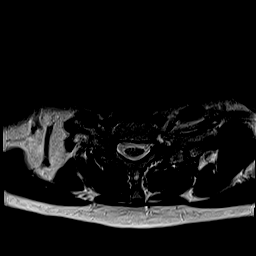
[im 8/29]
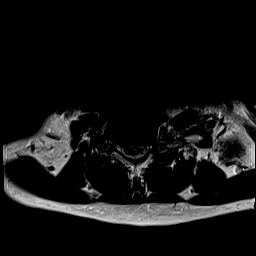
[im 13/29]
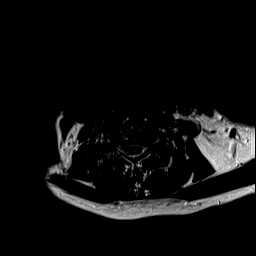
[im 16/29]
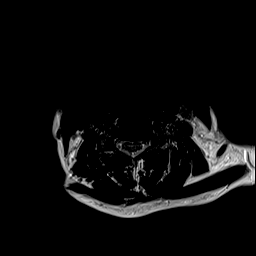
[im 21/29]
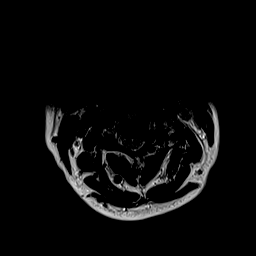
[im 23/29]
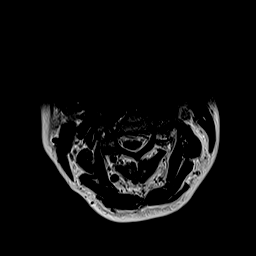
[im 26/29]
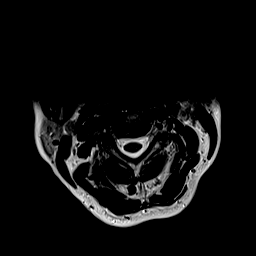
[im 29/29]
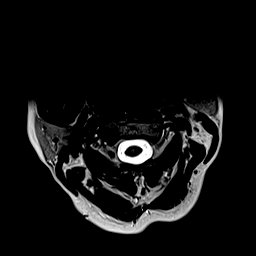

[Series 9: ax mpgr · axial · 3.0mm · 0.35mm/px · z∈[-138,-70]mm · 6 of 29 slices shown]
[im 1/29]
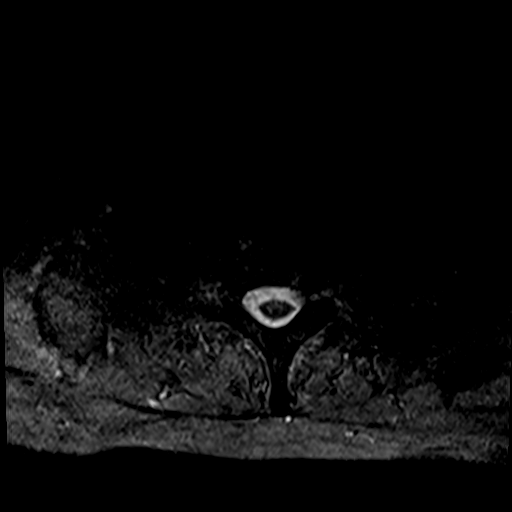
[im 6/29]
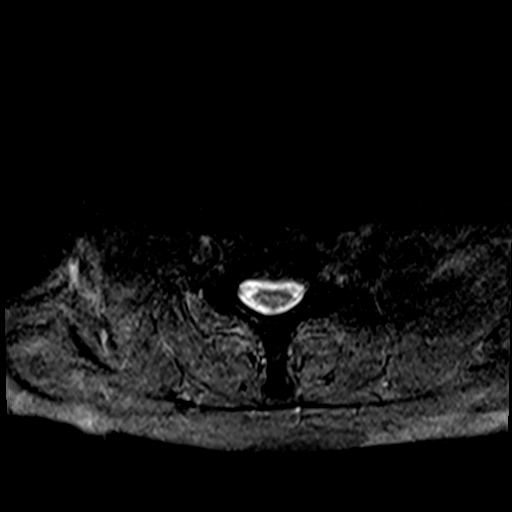
[im 8/29]
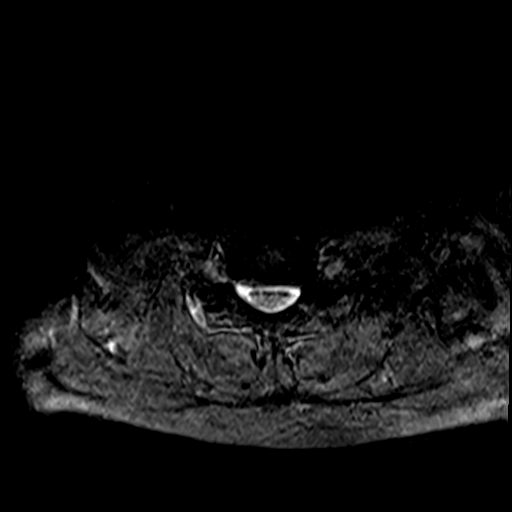
[im 13/29]
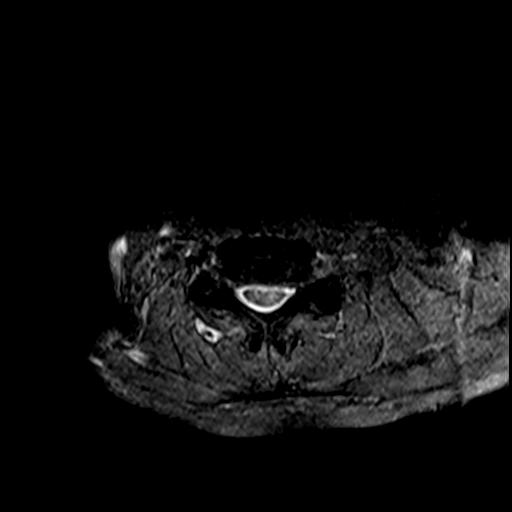
[im 16/29]
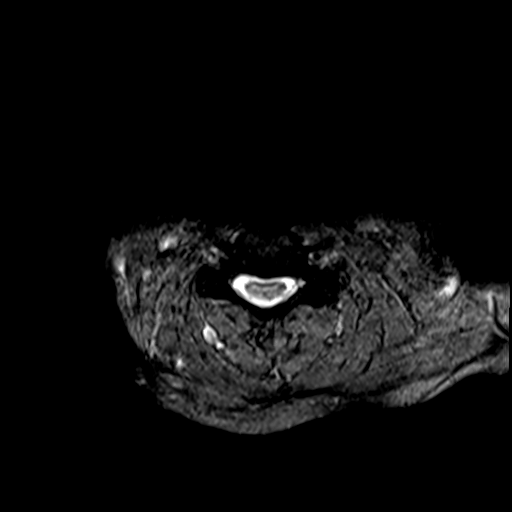
[im 21/29]
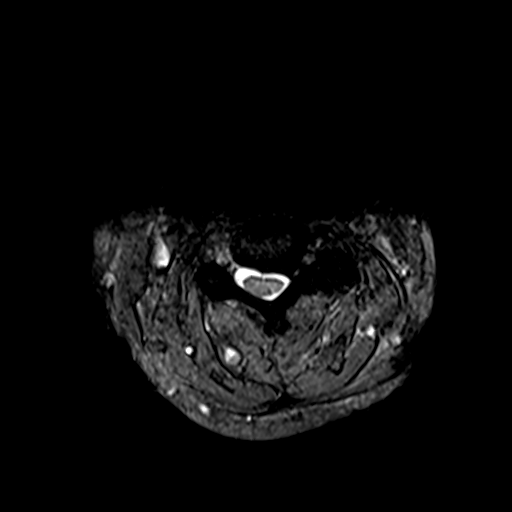

[Series 10: STIR · sagittal · 3.0mm · 0.62mm/px · 6 of 15 slices shown (2 of 2)]
[im 1/15]
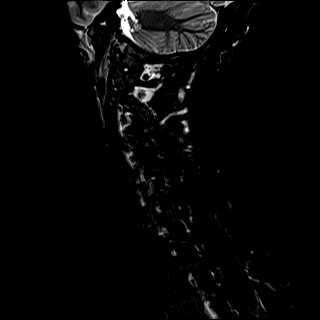
[im 3/15]
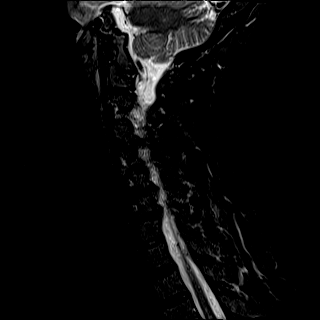
[im 6/15]
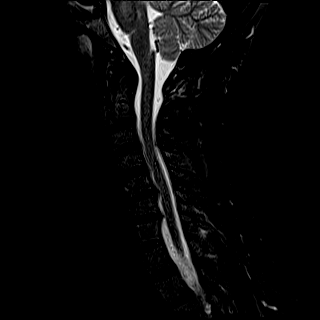
[im 9/15]
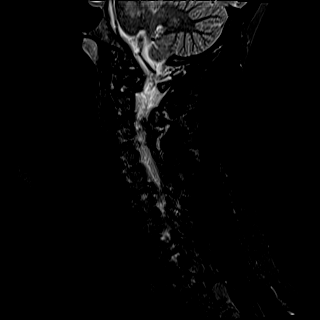
[im 12/15]
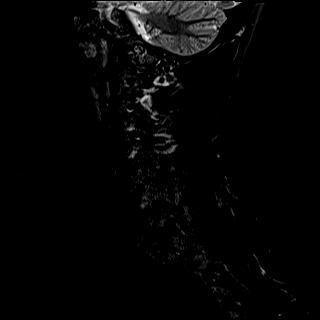
[im 15/15]
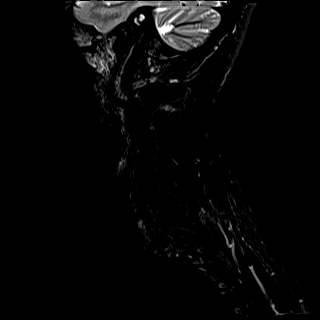

[39 of 48 positions shown; findings below may reference images not displayed]

FINDINGS: Alignment: Mild anterolisthesis C4-5 unchanged. Mild retrolisthesis
C6-7 unchanged. Straightening of the cervical spine with mild
kyphosis also unchanged.

Vertebrae: Negative for fracture or bone lesion.

Cord: Normal signal and morphology

Posterior Fossa, vertebral arteries, paraspinal tissues: Patchy
hyperintensity in the pons most consistent with chronic
microvascular ischemia. No interval change. No soft tissue mass in
the neck.

Disc levels:

C2-3: Moderate facet degeneration on the right with mild right
foraminal encroachment. Spinal canal and left foramen widely patent

C3-4: Large asymmetric uncinate spur on the left causing severe left
foraminal encroachment without interval change. Cord flattening on
the left with mild spinal stenosis unchanged.

C4-5: Mild disc and mild facet degeneration. Mild left foraminal
narrowing unchanged.

C5-6: Disc degeneration with diffuse uncinate spurring superimposed
right foraminal small disc protrusion appears slightly larger.
Correlate with right C6 symptoms. Bilateral mild facet degeneration.
Moderate foraminal encroachment bilaterally. Mild central canal
stenosis.

C6-7: Disc degeneration and diffuse uncinate spurring. Moderate left
foraminal narrowing and mild right foraminal narrowing. No interval
change

C7-T1: Negative
IMPRESSION: Multilevel degenerative change in the cervical spine. Multilevel
spinal and foraminal stenosis as above

Right foraminal encroachment at C5-6 due to spurring. In addition,
there is a small right-sided disc protrusion which may be slightly
larger compared with [PT] correlate with right C6 symptoms.

## 2020-10-12 ENCOUNTER — Other Ambulatory Visit: Payer: Self-pay | Admitting: Neurosurgery

## 2020-10-17 ENCOUNTER — Other Ambulatory Visit
Admission: RE | Admit: 2020-10-17 | Discharge: 2020-10-17 | Disposition: A | Discharge status: Home / Self Care | Source: Ambulatory Visit | Attending: Neurosurgery | Admitting: Neurosurgery

## 2020-10-17 ENCOUNTER — Other Ambulatory Visit: Payer: Self-pay

## 2020-10-17 ENCOUNTER — Telehealth: Payer: Self-pay | Admitting: Urgent Care

## 2020-10-17 DIAGNOSIS — Z01818 Encounter for other preprocedural examination: Secondary | ICD-10-CM | POA: Insufficient documentation

## 2020-10-17 DIAGNOSIS — Z20822 Contact with and (suspected) exposure to covid-19: Secondary | ICD-10-CM | POA: Insufficient documentation

## 2020-10-17 DIAGNOSIS — N390 Urinary tract infection, site not specified: Secondary | ICD-10-CM

## 2020-10-17 HISTORY — DX: Dyspnea, unspecified: R06.00

## 2020-10-17 LAB — BASIC METABOLIC PANEL
Anion gap: 9 (ref 5–15)
BUN: 17 mg/dL (ref 6–20)
CO2: 25 mmol/L (ref 22–32)
Calcium: 9.5 mg/dL (ref 8.9–10.3)
Chloride: 102 mmol/L (ref 98–111)
Creatinine, Ser: 0.87 mg/dL (ref 0.44–1.00)
GFR, Estimated: >60 mL/min (ref >60)
Glucose, Bld: 92 mg/dL (ref 70–99)
Potassium: 3.7 mmol/L (ref 3.5–5.1)
Sodium: 136 mmol/L (ref 135–145)

## 2020-10-17 LAB — SARS CORONAVIRUS 2 (TAT 6-24 HRS): SARS Coronavirus 2: NEGATIVE

## 2020-10-17 LAB — URINALYSIS, ROUTINE W REFLEX MICROSCOPIC
Bilirubin Urine: NEGATIVE
Glucose, UA: NEGATIVE mg/dL
Ketones, ur: NEGATIVE mg/dL
Nitrite: POSITIVE — AB
Protein, ur: NEGATIVE mg/dL
Specific Gravity, Urine: 1.019 (ref 1.005–1.030)
WBC, UA: >50 WBC/hpf — ABNORMAL HIGH (ref 0–5)
pH: 5 (ref 5.0–8.0)

## 2020-10-17 LAB — TYPE AND SCREEN
ABO/RH(D): B POS
Antibody Screen: NEGATIVE

## 2020-10-17 LAB — CBC
HCT: 42.1 % (ref 36.0–46.0)
Hemoglobin: 14.7 g/dL (ref 12.0–15.0)
MCH: 31.2 pg (ref 26.0–34.0)
MCHC: 34.9 g/dL (ref 30.0–36.0)
MCV: 89.4 fL (ref 80.0–100.0)
Platelets: 255 10*3/uL (ref 150–400)
RBC: 4.71 MIL/uL (ref 3.87–5.11)
RDW: 12.5 % (ref 11.5–15.5)
WBC: 9.4 10*3/uL (ref 4.0–10.5)
nRBC: 0 % (ref 0.0–0.2)

## 2020-10-17 LAB — SURGICAL PCR SCREEN
MRSA, PCR: NEGATIVE
Staphylococcus aureus: NEGATIVE

## 2020-10-17 MED ORDER — NITROFURANTOIN MONOHYD MACRO 100 MG PO CAPS: 100.0000 mg | ORAL_CAPSULE | Freq: Two times a day (BID) | ORAL | 0 refills | Status: AC

## 2020-10-17 NOTE — Patient Instructions (Addendum)
Your procedure is scheduled on: Wednesday October 18, 2020. Report to Day Surgery inside [facility] 2nd floor (stop by admissions desk first before getting on elevator). To find out your arrival time please call [phone] between 1PM - 3PM on Tuesday October 17, 2020.  Remember: Instructions that are not followed completely may result in serious medical risk,  up to and including death, or upon the discretion of your surgeon and anesthesiologist your  surgery may need to be rescheduled.     _X__ 1. Do not eat food after midnight the night before your procedure.                 No chewing gum or hard candies. You may drink clear liquids up to 2 hours                 before you are scheduled to arrive for your surgery- DO not drink clear                 liquids within 2 hours of the start of your surgery.                 Clear Liquids include:  water, apple juice without pulp, clear Gatorade, G2 or                  Gatorade Zero (avoid Red/Purple/Blue), Black Coffee or Tea (Do not add                 anything to coffee or tea).  __X__2.  On the morning of surgery brush your teeth with toothpaste and water, you                may rinse your mouth with mouthwash if you wish.  Do not swallow any toothpaste of mouthwash.     _X__ 3.  No Alcohol for 24 hours before or after surgery.   _X__ 4.  Do Not Smoke or use e-cigarettes For 24 Hours Prior to Your Surgery.                 Do not use any chewable tobacco products for at least 6 hours prior to                 Surgery.  _X__  5.  Do not use any recreational drugs (marijuana, cocaine, heroin, ecstasy, MDMA or other)                For at least one week prior to your surgery.  Combination of these drugs with anesthesia                May have life threatening results.   __X__6.  Notify your doctor if there is any change in your medical condition      (cold, fever, infections).     Do not wear jewelry, make-up,  hairpins, clips or nail polish. Do not wear lotions, powders, or perfumes. You may wear deodorant. Do not shave 48 hours prior to surgery. Men may shave face and neck. Do not bring valuables to the hospital.    [facility] is not responsible for any belongings or valuables.  Contacts, dentures or bridgework may not be worn into surgery. Leave your suitcase in the car. After surgery it may be brought to your room. For patients admitted to the hospital, discharge time is determined by your treatment team.   Patients discharged the day of surgery will not be allowed to drive  home.   Make arrangements for someone to be with you for the first 24 hours of your Same Day Discharge.   __X__ Take these medicines the morning of surgery with A SIP OF WATER:    1. amLODipine (NORVASC) 5 MG  2.   3.   4.  5.  6.  ____ Fleet Enema (as directed)   __X__ Use CHG Soap (or wipes) as directed  ____ Use Benzoyl Peroxide Gel as instructed  ____ Use inhalers on the day of surgery  __X__ Skip next 2 doses of Adalimumab 40 MG/0.8ML as instructed by your doctor.    ____ Take 1/2 of usual insulin dose the night before surgery. No insulin the morning          of surgery.   ____ Call your PCP, cardiologist, or Pulmonologist if taking Coumadin/Plavix/aspirin and ask when to stop before your surgery.   __X__ One Week prior to surgery- Stop Anti-inflammatories such as Ibuprofen, Aleve, Advil, Motrin, meloxicam (MOBIC), diclofenac, etodolac, ketorolac, Toradol, Daypro, piroxicam, Goody's or BC powders. OK TO USE TYLENOL IF NEEDED   __X__ Stop supplements until after surgery.    ____ Bring C-Pap to the hospital.    If you have any questions regarding your pre-procedure instructions,  Please call Pre-admit Testing at [phone].

## 2020-10-17 NOTE — Progress Notes (Signed)
[facility] Perioperative Services: Pre-Admission/Anesthesia Testing  Abnormal Lab Notification and Treatment Plan of Care   Date: 10/17/20  Name: Jillian Ward MRN:   [id number]  Re: Abnormal labs noted during PAT appointment   Provider(s) Notified: Venetia Night, MD Notification mode: Routed and/or faxed via [facility]   ABNORMAL LAB VALUE(S): Lab Results  Component Value Date   COLORURINE AMBER (A) 10/17/2020   APPEARANCEUR CLOUDY (A) 10/17/2020   LABSPEC 1.019 10/17/2020   PHURINE 5.0 10/17/2020   GLUCOSEU NEGATIVE 10/17/2020   HGBUR MODERATE (A) 10/17/2020   BILIRUBINUR NEGATIVE 10/17/2020   KETONESUR NEGATIVE 10/17/2020   PROTEINUR NEGATIVE 10/17/2020   NITRITE POSITIVE (A) 10/17/2020   LEUKOCYTESUR LARGE (A) 10/17/2020   EPIU 6-10 10/17/2020   WBCU >50 (H) 10/17/2020   RBCU 0-5 10/17/2020   BACTERIA MANY (A) 10/17/2020   Notes:   Patient scheduled for a C4-6 ANTERIOR CERVICAL DECOMPRESSION/DISCECTOMY FUSION 2 LEVELS on 10/18/2020.  UA performed in PAT consistent with for infection.  No leukocytosis noted on CBC; 9400 Renal function: Estimated Creatinine Clearance: 61.8 mL/min (by C-G formula based on SCr of 0.87 mg/dL). Urine C&S added to assess for pathogenically significant growth.  Impression and Plan:  UA was (+) for infection; reflex culture sent.  Contacted patient to discuss. Patient reporting that she is experiencing (+) urinary frequency, urgency, and dysuria.  She endorses some mild suprapubic pain and lower back pain.  Patient has had a decreased appetite over the course of the last few days, which she attributed to use of her opioid pain medication.  Patient denies nausea, vomiting, and fevers.  She does not have a history of recurrent urinary tract infections per her report..  Patient with surgery scheduled tomorrow. Result initially sent to primary attending surgeon to determine whether or not he would like to proceed with the  case versus treating the infection first.  MD on another campus today.  Clinical information reviewed with MD who expressed plans to proceed with case as already scheduled.  MD asking for patient to be started on appropriate antimicrobial therapy today.  Allergies reviewed; patient PCN allergic.  Considered shortest course of Tx using SMZ-TMP, however patient currently on MTX.  Even with short courses of SMZ-TMP, the risk of medication induced myelosuppression and nephrotoxicity is substantial due to decreased renal excretion of the MTX.  Will treat with a 5 day course of nitrofurantoin. Patient encouraged to complete the entire course of antibiotics even if she begins to feel better. She was advised that if culture demonstrates resistance to the prescribed antibiotic, she will be contacted and advised of the need to change the antibiotic being used to treat her infection.   Meds ordered this encounter  Medications   nitrofurantoin, macrocrystal-monohydrate, (MACROBID) 100 MG capsule    Sig: Take 1 capsule (100 mg total) by mouth 2 (two) times daily for 5 days. Increase WATER intake while taking this medication.    Dispense:  10 capsule    Refill:  0   Patient encouraged to increase her fluid intake as much as possible. Discussed that water is always best to flush the urinary tract. She was advised to avoid caffeine containing fluids until her infections clears, as caffeine can cause her to experience painful bladder spasms.   May use Tylenol as needed for pain/fever.   Results and treatment plan of care forwarded to primary attending surgeon to make them aware.   This is a Personal assistant; no formal response is required.  Quentin Mulling, MSN, APRN, FNP-C, CEN [facility]  Peri-operative Services Nurse Practitioner Phone: [phone] Fax: [phone] 10/17/20 4:00 PM

## 2020-10-17 NOTE — Progress Notes (Signed)
  [facility] Perioperative Services: Pre-Admission/Anesthesia Testing  Abnormal Lab Notification   Date: 10/17/20  Name: Jillian Ward MRN:   [id number]  Re: Abnormal labs noted during PAT appointment   Provider(s) Notified: Venetia Night, MD Notification mode: Routed and/or faxed via CH[facility]   ABNORMAL LAB VALUE(S): Lab Results  Component Value Date   COLORURINE AMBER (A) 10/17/2020   APPEARANCEUR CLOUDY (A) 10/17/2020   LABSPEC 1.019 10/17/2020   PHURINE 5.0 10/17/2020   GLUCOSEU NEGATIVE 10/17/2020   HGBUR MODERATE (A) 10/17/2020   BILIRUBINUR NEGATIVE 10/17/2020   KETONESUR NEGATIVE 10/17/2020   PROTEINUR NEGATIVE 10/17/2020   NITRITE POSITIVE (A) 10/17/2020   LEUKOCYTESUR LARGE (A) 10/17/2020   EPIU 6-10 10/17/2020   WBCU >50 (H) 10/17/2020   RBCU 0-5 10/17/2020   BACTERIA MANY (A) 10/17/2020   Notes:  Patient is scheduled for a C4-6 ANTERIOR CERVICAL DECOMPRESSION/DISCECTOMY FUSION 2 LEVELS on 10/18/2020. UA (+) for infection; C&S has been added.  Given that her procedure is tomorrow, there is no time for pre-operative treatment by PAT. Will forward to primary attending surgeon for review and consideration of treatment vs. postponing of procedure.    This is a Personal assistant; no formal response is required.  Quentin Mulling, MSN, APRN, FNP-C, CEN [facility]  Peri-operative Services Nurse Practitioner Phone: [phone] Fax: [phone] 10/17/20 2:41 PM

## 2020-10-18 ENCOUNTER — Encounter: Payer: Self-pay | Admitting: Neurosurgery

## 2020-10-18 ENCOUNTER — Other Ambulatory Visit: Payer: Self-pay

## 2020-10-18 ENCOUNTER — Inpatient Hospital Stay

## 2020-10-18 ENCOUNTER — Inpatient Hospital Stay: Admitting: Urgent Care

## 2020-10-18 ENCOUNTER — Inpatient Hospital Stay
Admission: RE | Admit: 2020-10-18 | Discharge: 2020-10-19 | DRG: 472 | Disposition: A | Discharge status: Home / Self Care | Attending: Neurosurgery | Admitting: Neurosurgery

## 2020-10-18 ENCOUNTER — Encounter
Admission: RE | Disposition: A | Discharge status: Home / Self Care | Payer: Self-pay | Source: Home / Self Care | Attending: Neurosurgery

## 2020-10-18 DIAGNOSIS — F17210 Nicotine dependence, cigarettes, uncomplicated: Secondary | ICD-10-CM | POA: Diagnosis present

## 2020-10-18 DIAGNOSIS — Z419 Encounter for procedure for purposes other than remedying health state, unspecified: Secondary | ICD-10-CM

## 2020-10-18 DIAGNOSIS — M5412 Radiculopathy, cervical region: Secondary | ICD-10-CM | POA: Diagnosis present

## 2020-10-18 DIAGNOSIS — M40202 Unspecified kyphosis, cervical region: Secondary | ICD-10-CM | POA: Diagnosis present

## 2020-10-18 DIAGNOSIS — I10 Essential (primary) hypertension: Secondary | ICD-10-CM | POA: Diagnosis present

## 2020-10-18 DIAGNOSIS — Z01818 Encounter for other preprocedural examination: Secondary | ICD-10-CM

## 2020-10-18 DIAGNOSIS — M4802 Spinal stenosis, cervical region: Secondary | ICD-10-CM | POA: Diagnosis present

## 2020-10-18 DIAGNOSIS — Z20822 Contact with and (suspected) exposure to covid-19: Secondary | ICD-10-CM | POA: Diagnosis present

## 2020-10-18 DIAGNOSIS — Z91040 Latex allergy status: Secondary | ICD-10-CM

## 2020-10-18 DIAGNOSIS — N390 Urinary tract infection, site not specified: Secondary | ICD-10-CM | POA: Diagnosis present

## 2020-10-18 DIAGNOSIS — Z880 Allergy status to penicillin: Secondary | ICD-10-CM | POA: Diagnosis not present

## 2020-10-18 DIAGNOSIS — M4312 Spondylolisthesis, cervical region: Principal | ICD-10-CM | POA: Diagnosis present

## 2020-10-18 HISTORY — PX: ANTERIOR CERVICAL DECOMP/DISCECTOMY FUSION: SHX1161

## 2020-10-18 LAB — ABO/RH: ABO/RH(D): B POS

## 2020-10-18 IMAGING — RF DG CERVICAL SPINE 2 OR 3 VIEWS
1 series · 3 of 3 positions shown · non-contrast
Comparison: MRI of the cervical spine [DATE].

CLINICAL DATA: Surgery, elective. Additional history obtained from
electronic medical record: Anterior cervical discectomy and fusion
at C4-C5 and C5-C6.

EXAM:
CERVICAL SPINE - 2-3 VIEW; DG C-ARM 1-60 MIN

[Series 1: dg x-ray · 0.20mm/px · 3 of 3 slices shown]
[im 1/3]
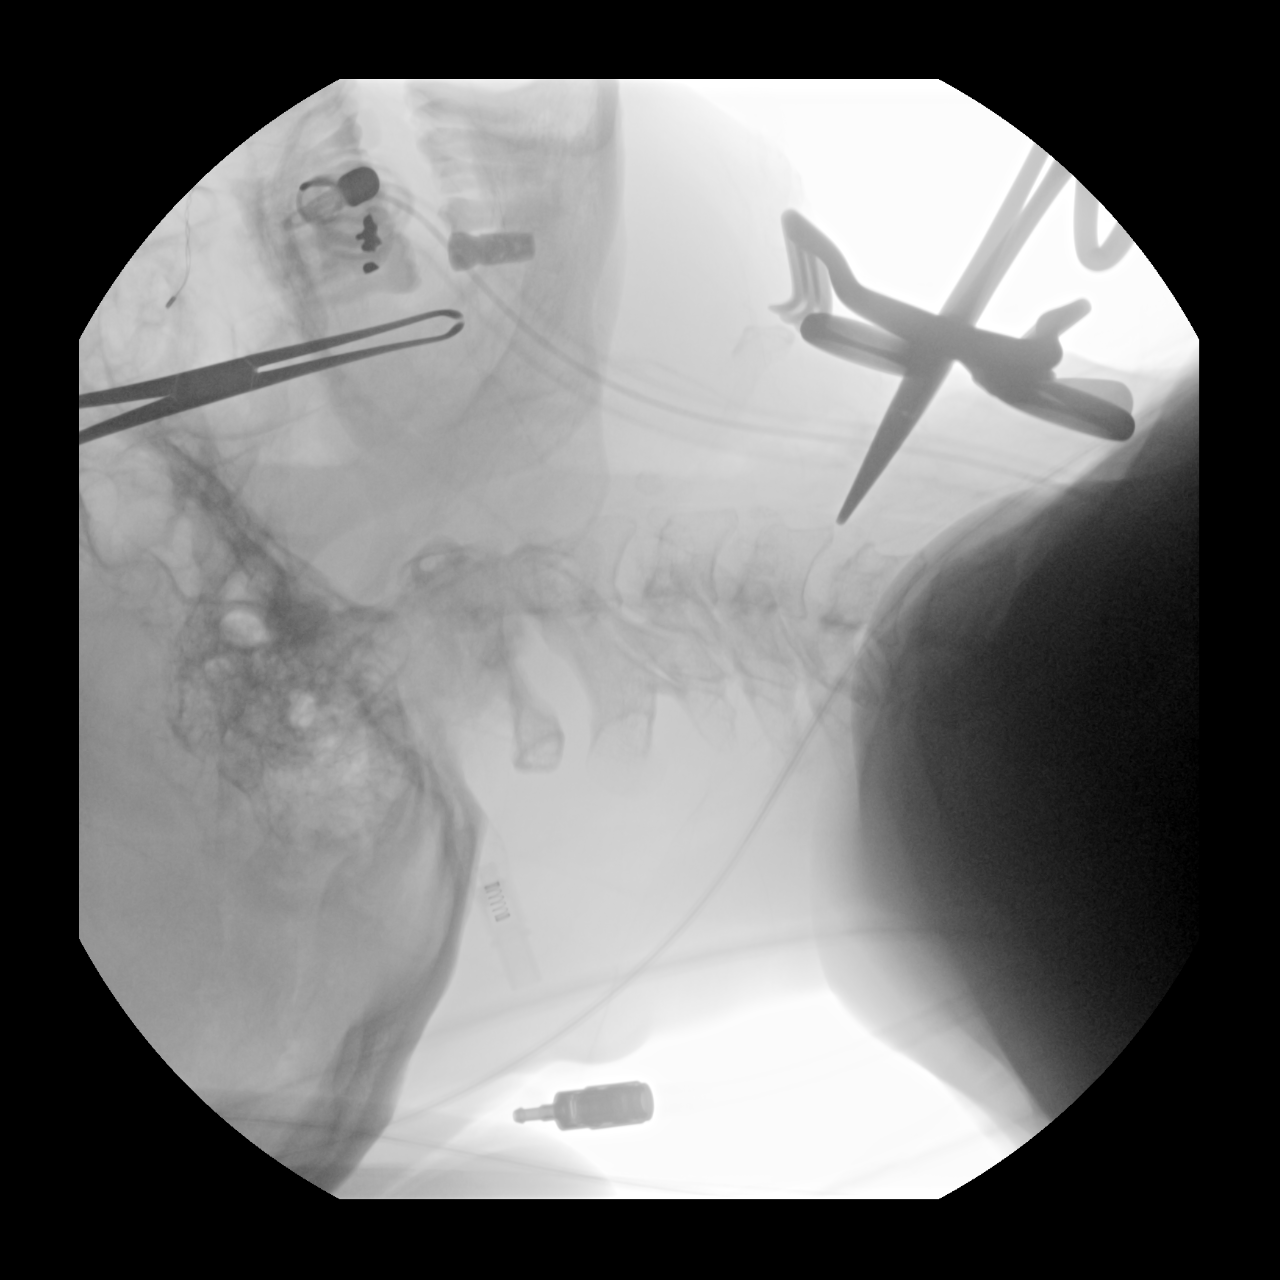
[im 2/3]
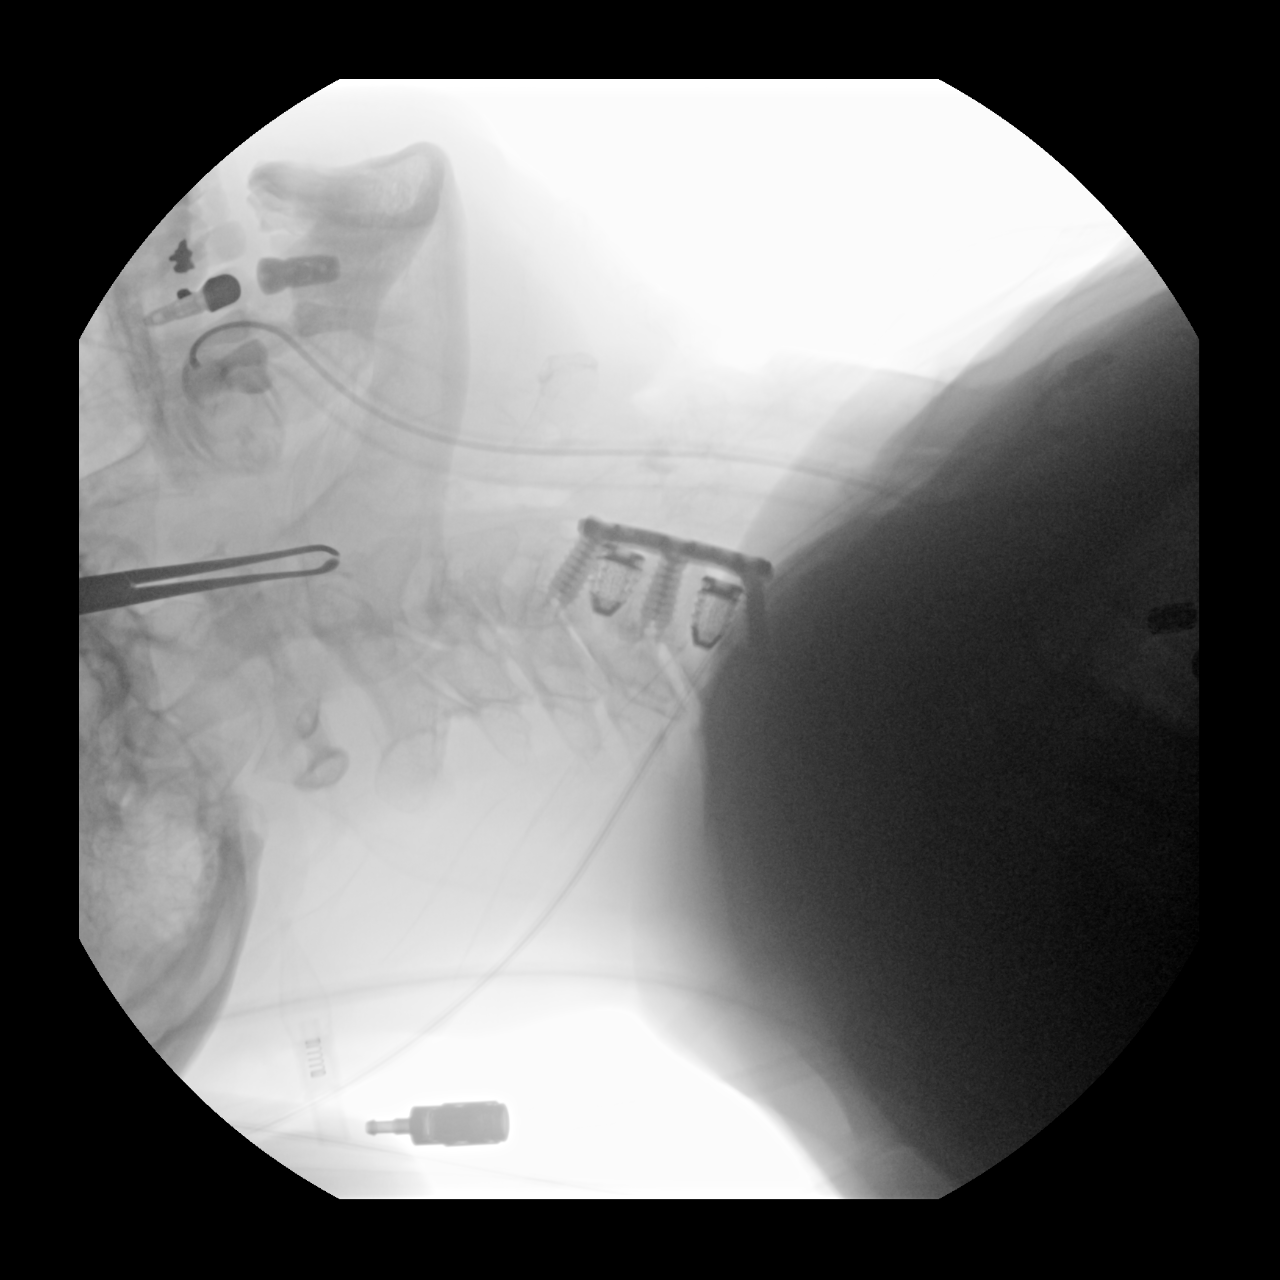
[im 3/3]
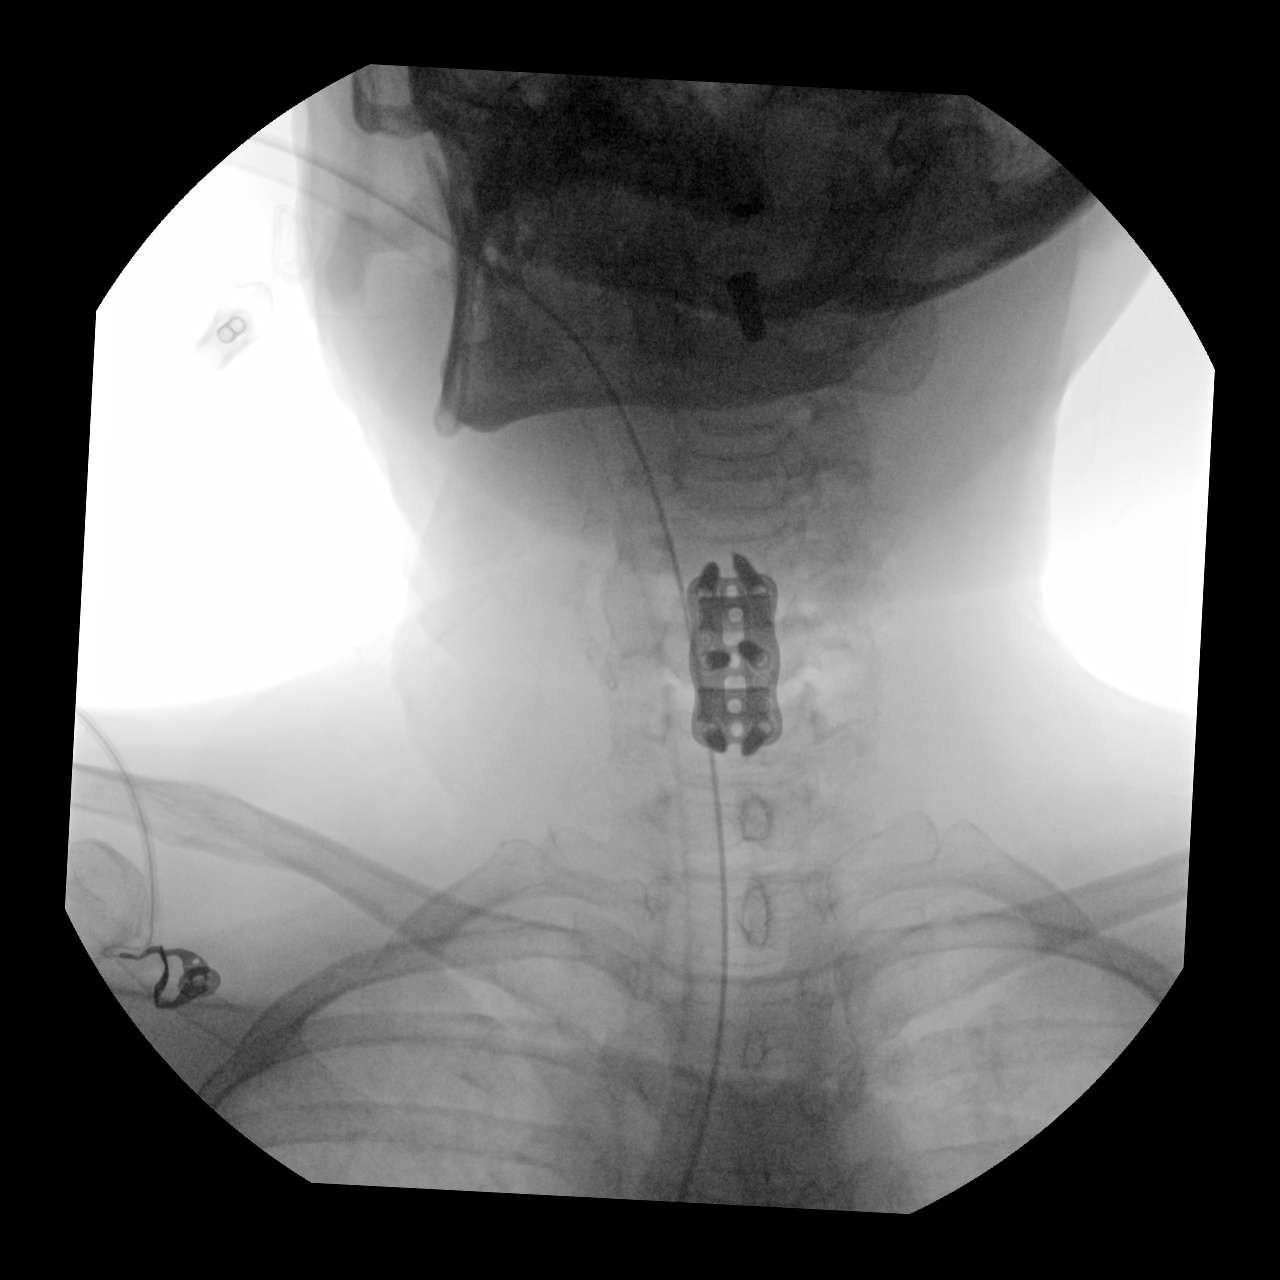

[3 of 3 positions shown; findings below may reference images not displayed]

FINDINGS: PA and lateral view intraoperative fluoroscopic images of the
cervical spine are submitted, 3 images total. On the most recent
provided images, acquired at [DATE] and [DATE] a.m., ACDF hardware is
present at the C4-C6 levels (ventral plate and screws as well as
interbody devices). Partially visualized ET tube.
IMPRESSION: Three intraoperative fluoroscopic images of the cervical spine from
C4-C6 ACDF, as described.

## 2020-10-18 IMAGING — RF DG C-ARM 1-60 MIN
1 series · 3 of 3 positions shown · non-contrast
Comparison: MRI of the cervical spine [DATE].

CLINICAL DATA: Surgery, elective. Additional history obtained from
electronic medical record: Anterior cervical discectomy and fusion
at C4-C5 and C5-C6.

EXAM:
CERVICAL SPINE - 2-3 VIEW; DG C-ARM 1-60 MIN

[Series 1: dg x-ray · 0.20mm/px · 3 of 3 slices shown]
[im 1/3]
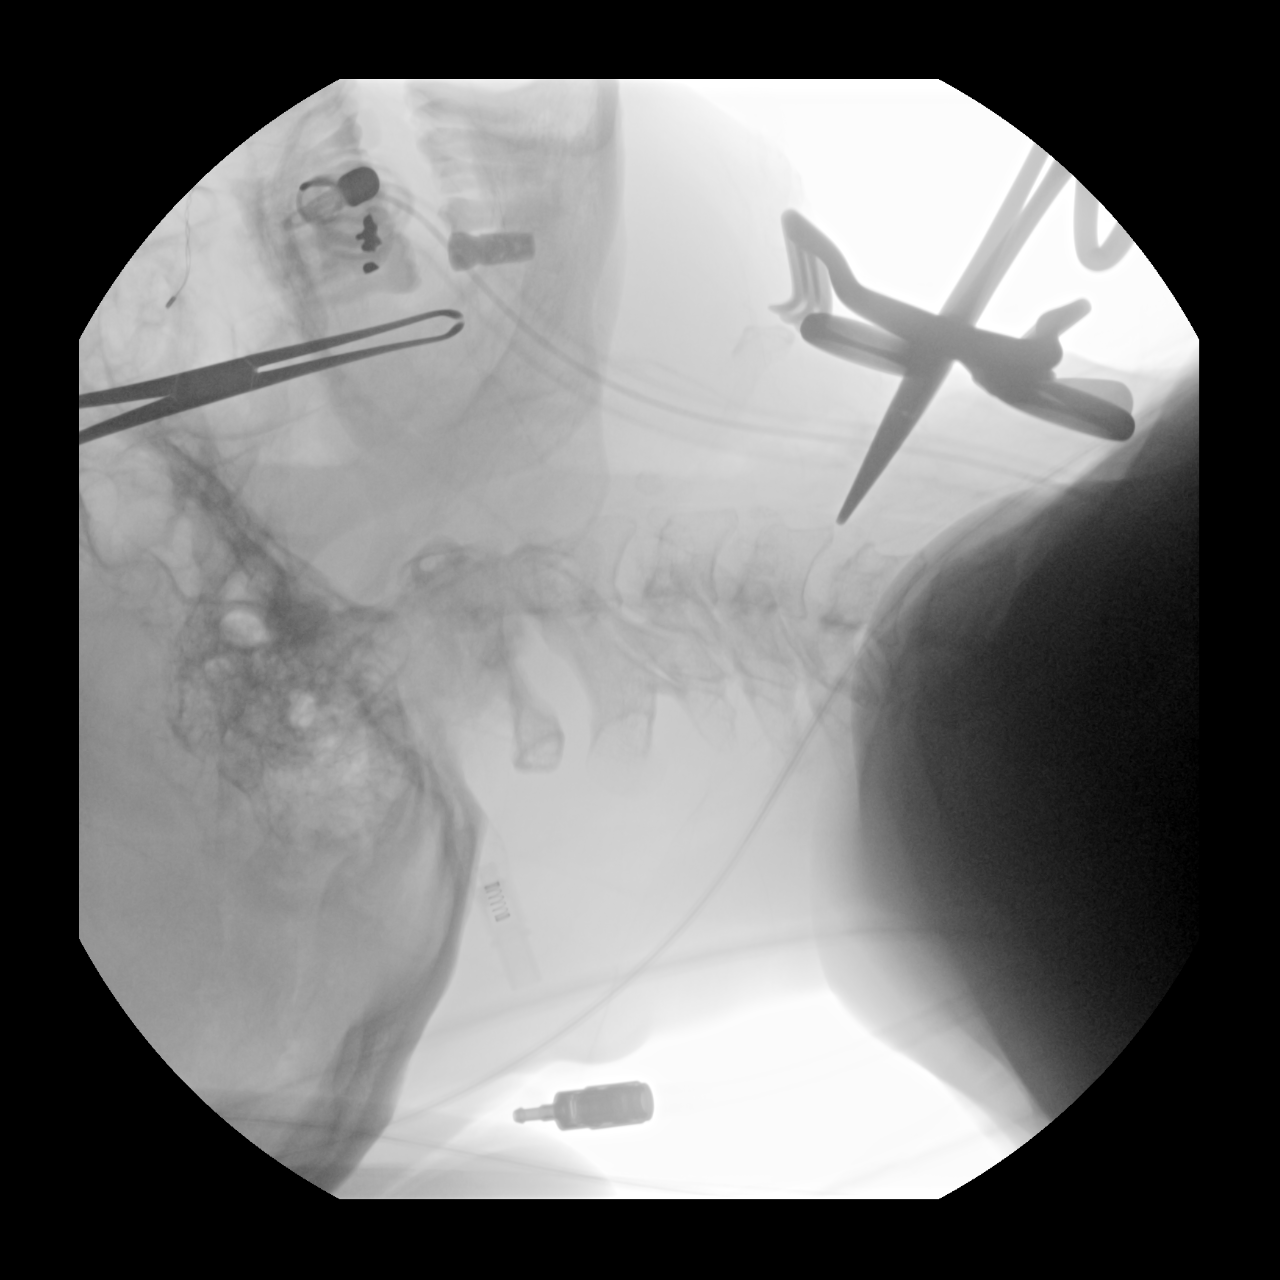
[im 2/3]
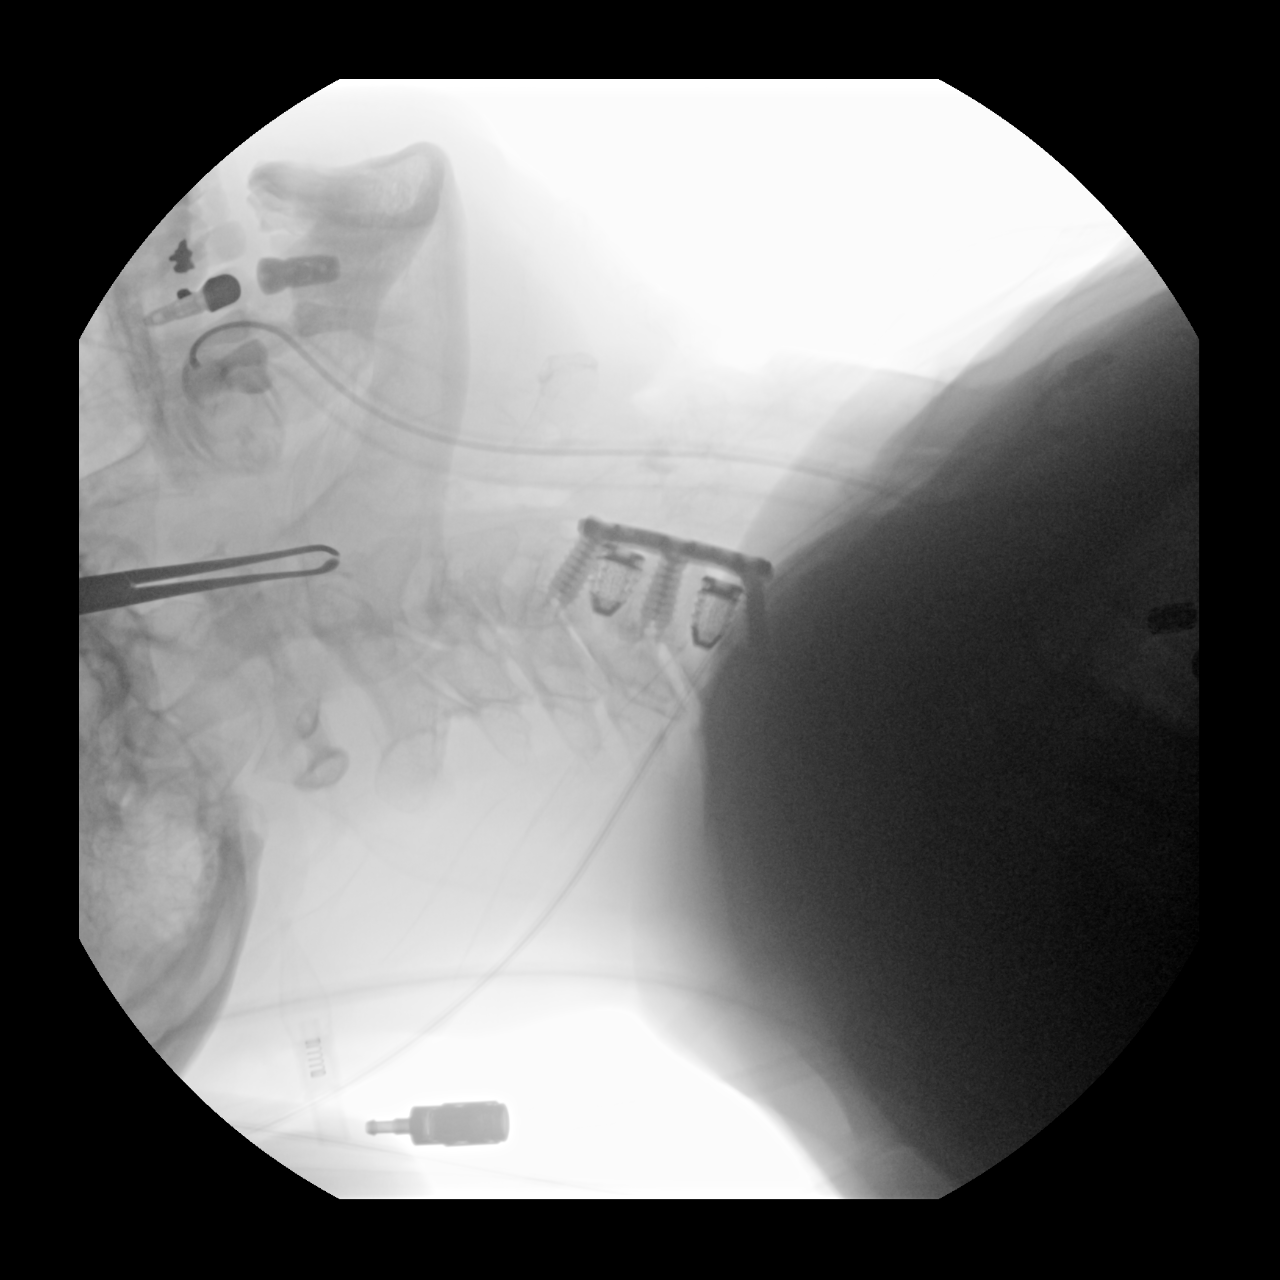
[im 3/3]
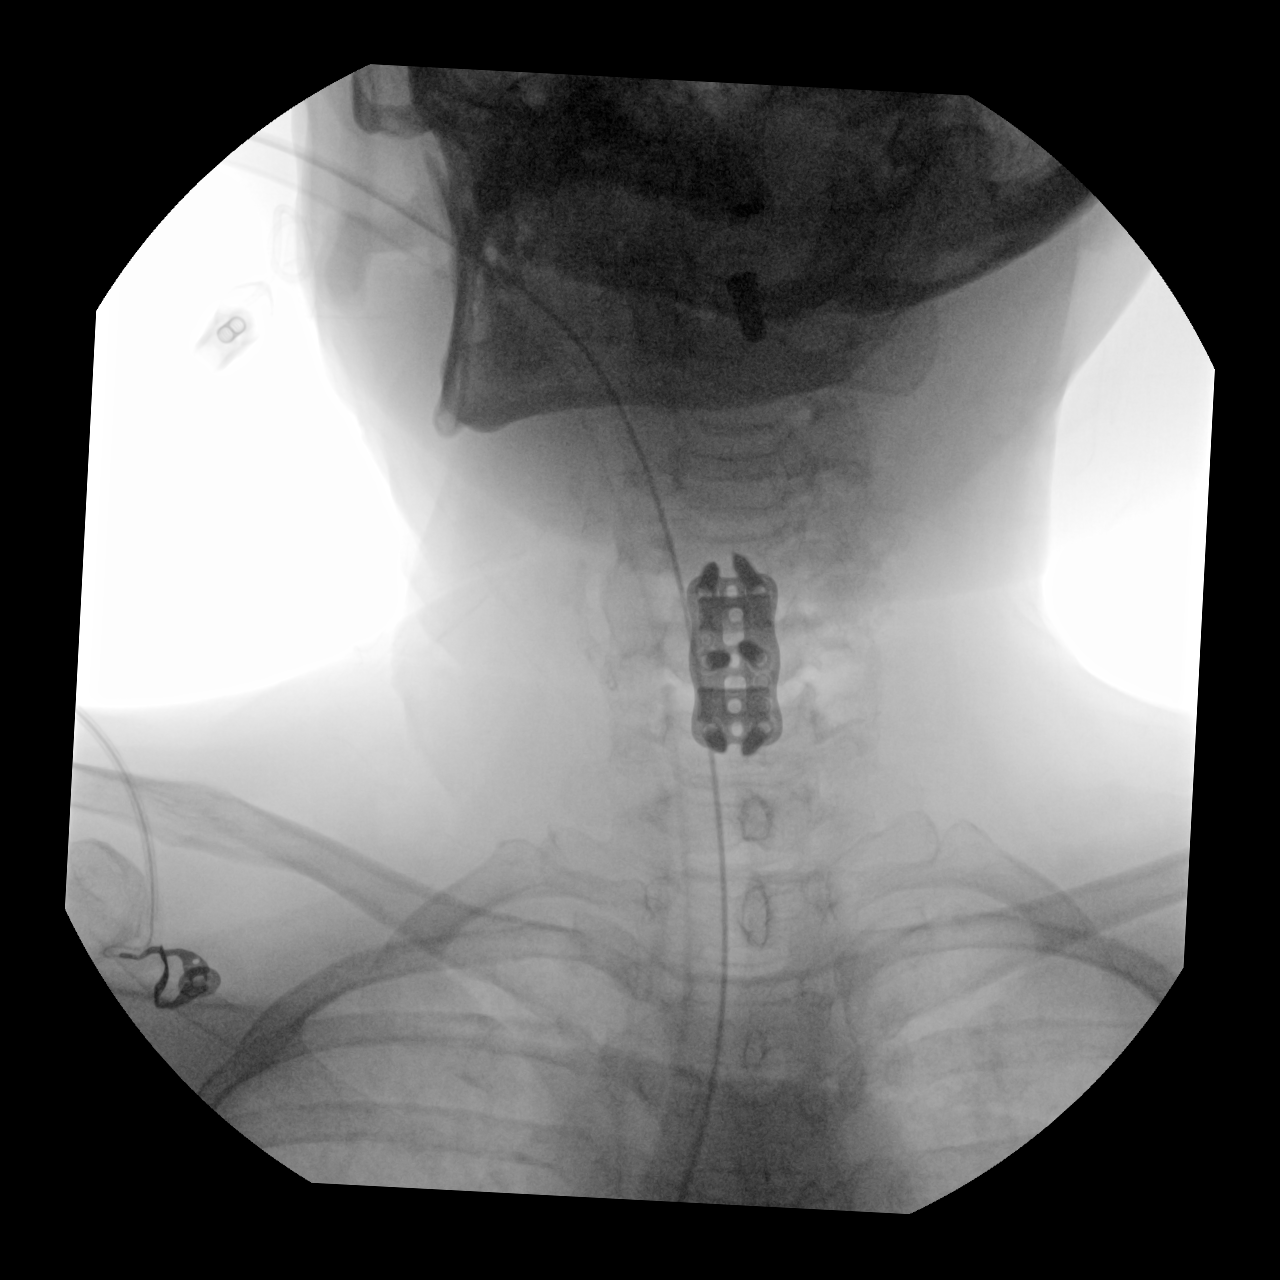

[3 of 3 positions shown; findings below may reference images not displayed]

FINDINGS: PA and lateral view intraoperative fluoroscopic images of the
cervical spine are submitted, 3 images total. On the most recent
provided images, acquired at [DATE] and [DATE] a.m., ACDF hardware is
present at the C4-C6 levels (ventral plate and screws as well as
interbody devices). Partially visualized ET tube.
IMPRESSION: Three intraoperative fluoroscopic images of the cervical spine from
C4-C6 ACDF, as described.

## 2020-10-18 IMAGING — RF DG C-ARM 1-60 MIN
1 series · 3 of 3 positions shown · non-contrast
Comparison: MRI of the cervical spine [DATE].

CLINICAL DATA: Surgery, elective. Additional history obtained from
electronic medical record: Anterior cervical discectomy and fusion
at C4-C5 and C5-C6.

EXAM:
CERVICAL SPINE - 2-3 VIEW; DG C-ARM 1-60 MIN

[Series 1: dg x-ray · 0.20mm/px · 3 of 3 slices shown]
[im 1/3]
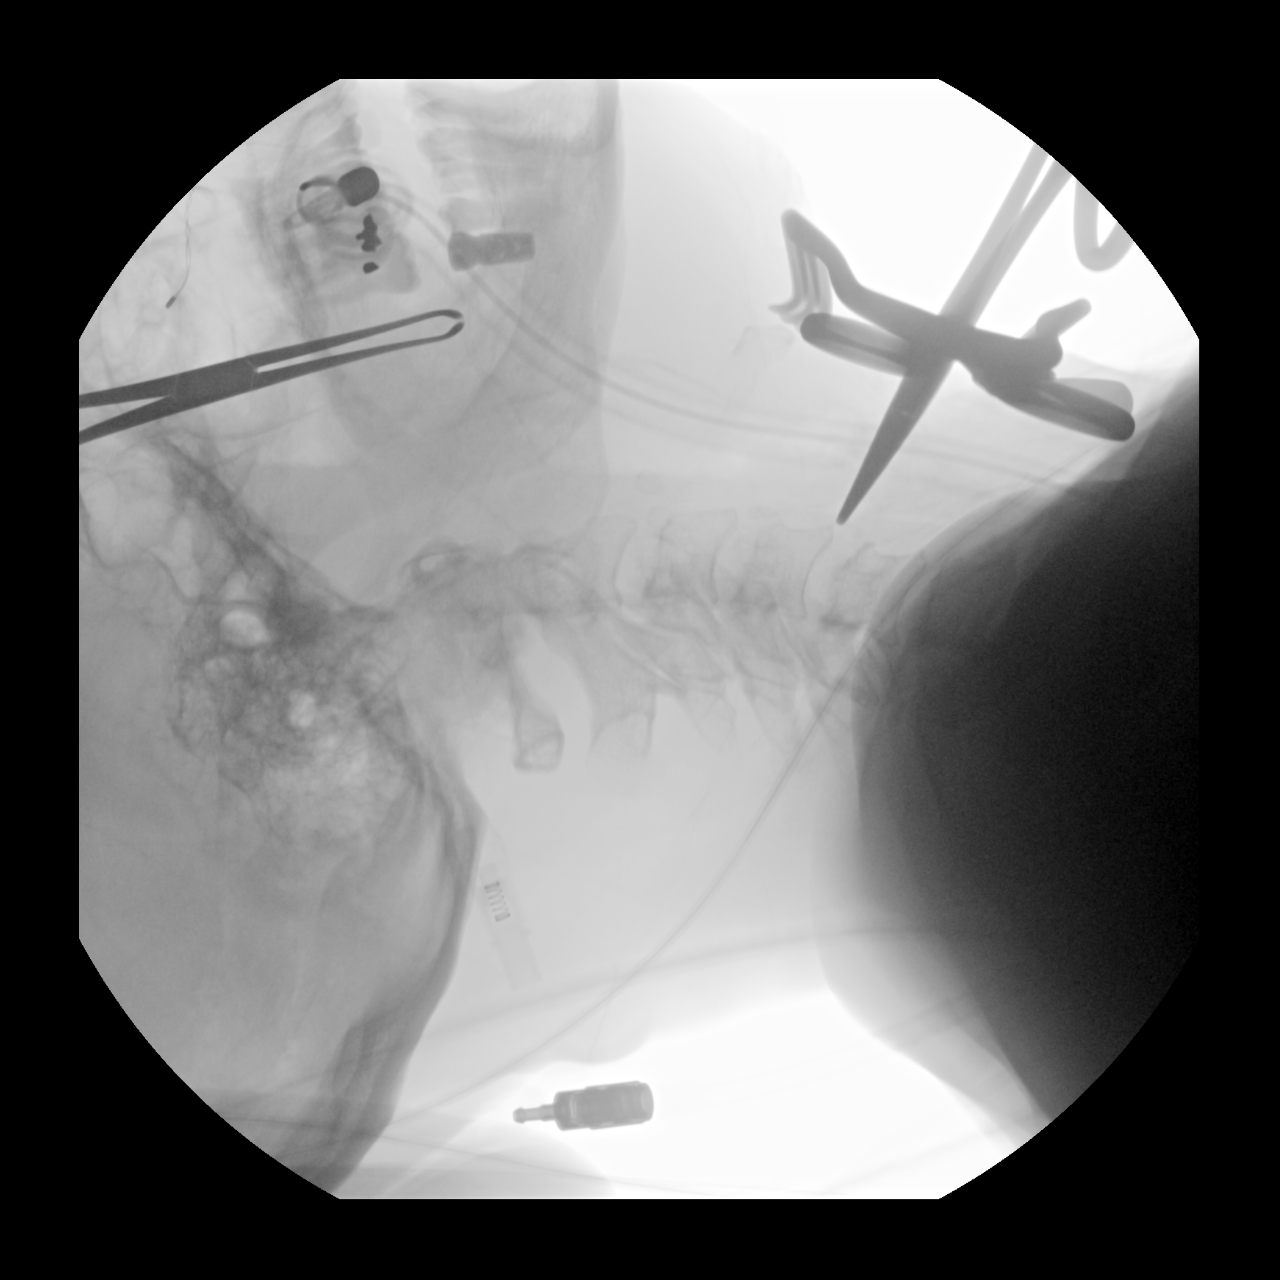
[im 2/3]
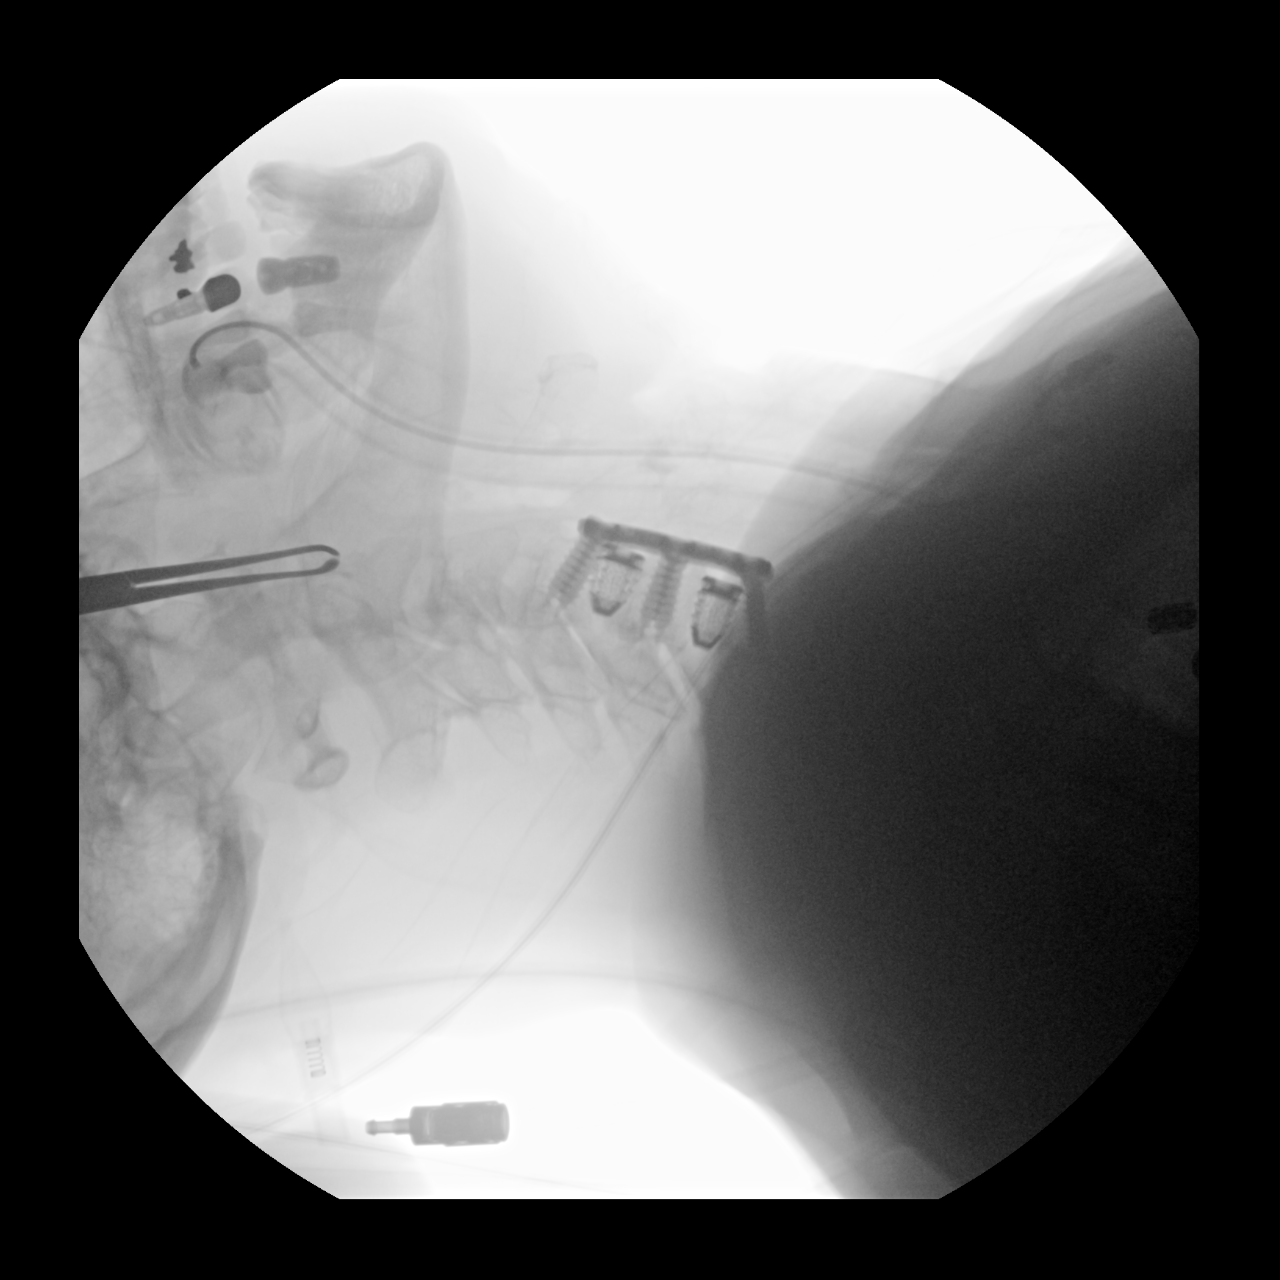
[im 3/3]
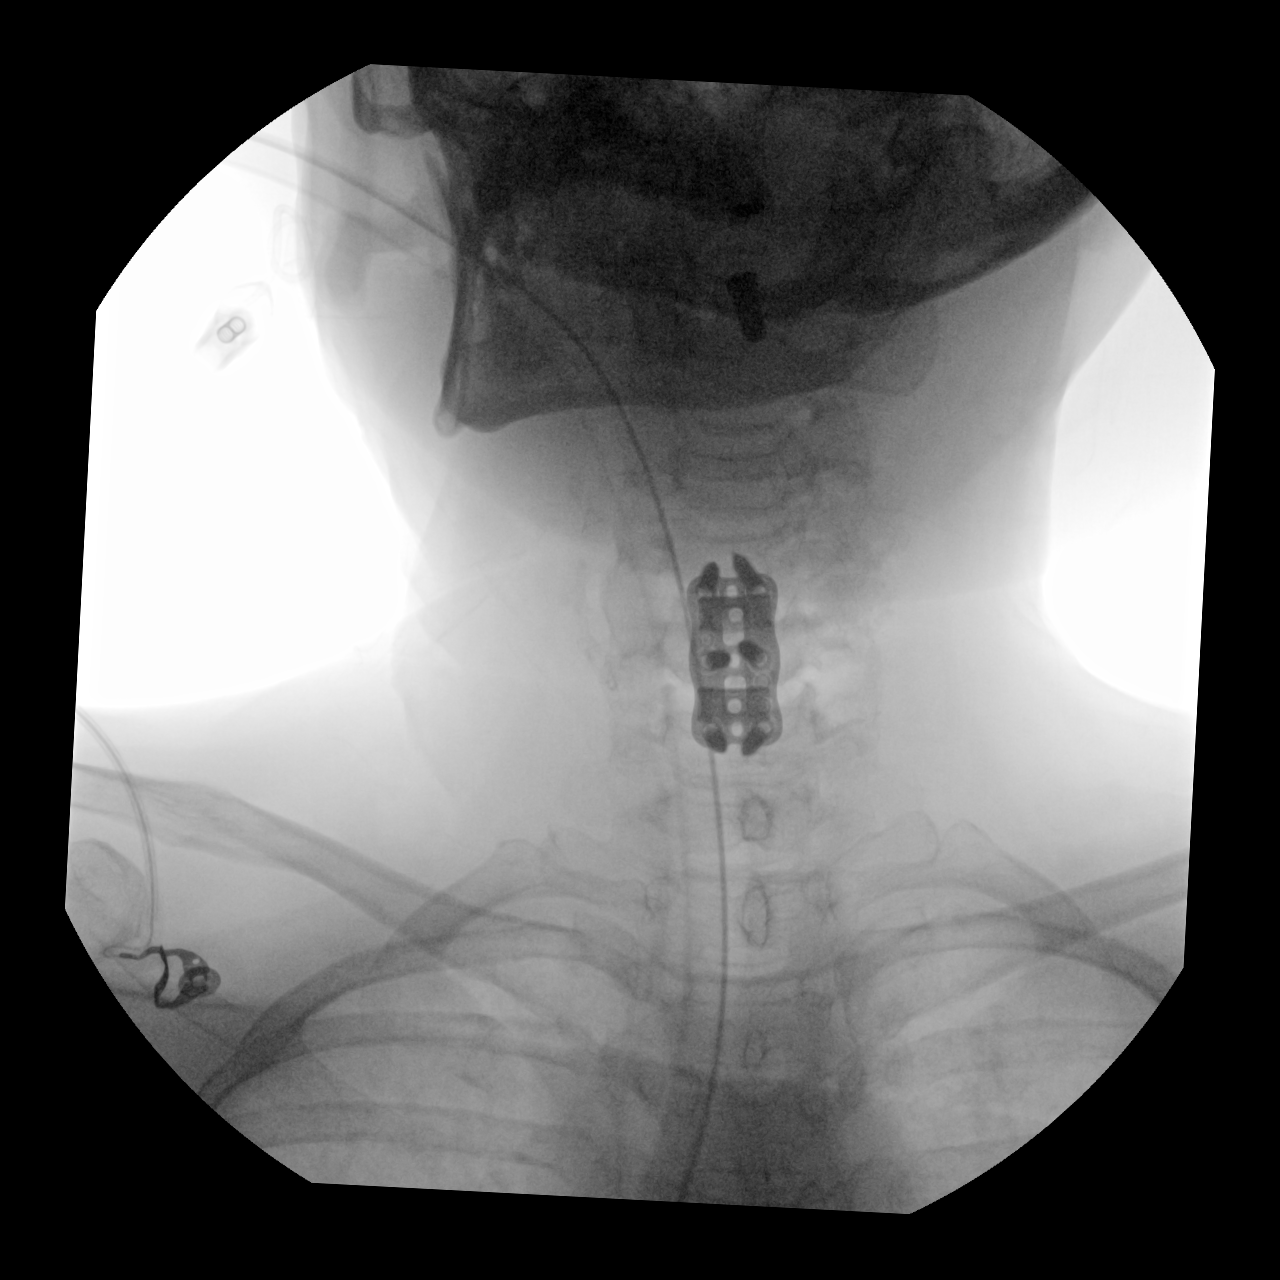

[3 of 3 positions shown; findings below may reference images not displayed]

FINDINGS: PA and lateral view intraoperative fluoroscopic images of the
cervical spine are submitted, 3 images total. On the most recent
provided images, acquired at [DATE] and [DATE] a.m., ACDF hardware is
present at the C4-C6 levels (ventral plate and screws as well as
interbody devices). Partially visualized ET tube.
IMPRESSION: Three intraoperative fluoroscopic images of the cervical spine from
C4-C6 ACDF, as described.

## 2020-10-18 SURGERY — ANTERIOR CERVICAL DECOMPRESSION/DISCECTOMY FUSION 2 LEVELS
Anesthesia: General

## 2020-10-18 MED ORDER — PHENYLEPHRINE HCL-NACL 10-0.9 MG/250ML-% IV SOLN
INTRAVENOUS | Status: DC | PRN
  Administered 2020-10-18: 50 ug/min via INTRAVENOUS

## 2020-10-18 MED ORDER — LACTATED RINGERS IV SOLN: INTRAVENOUS | Status: DC | PRN

## 2020-10-18 MED ORDER — EPHEDRINE SULFATE 50 MG/ML IJ SOLN
INTRAMUSCULAR | Status: DC | PRN
  Administered 2020-10-18 (×2): 5 mg via INTRAVENOUS
  Administered 2020-10-18: 10 mg via INTRAVENOUS
  Administered 2020-10-18: 5 mg via INTRAVENOUS

## 2020-10-18 MED ORDER — OXYCODONE HCL 5 MG/5ML PO SOLN
5.0000 mg | Freq: Once | ORAL | Status: AC | PRN
  Administered 2020-10-18: 5 mg via ORAL

## 2020-10-18 MED ORDER — SODIUM CHLORIDE 0.9% FLUSH: 3.0000 mL | INTRAVENOUS | Status: DC | PRN

## 2020-10-18 MED ORDER — FENTANYL CITRATE (PF) 100 MCG/2ML IJ SOLN
25.0000 ug | INTRAMUSCULAR | Status: DC | PRN
  Administered 2020-10-18: 50 ug via INTRAVENOUS

## 2020-10-18 MED ORDER — PROPOFOL 10 MG/ML IV BOLUS
INTRAVENOUS | Status: AC
  Filled 2020-10-18: qty 20

## 2020-10-18 MED ORDER — DEXAMETHASONE SODIUM PHOSPHATE 10 MG/ML IJ SOLN
INTRAMUSCULAR | Status: DC | PRN
  Administered 2020-10-18: 10 mg via INTRAVENOUS

## 2020-10-18 MED ORDER — CELECOXIB 200 MG PO CAPS
200.0000 mg | ORAL_CAPSULE | Freq: Two times a day (BID) | ORAL | Status: DC
  Administered 2020-10-18 – 2020-10-19 (×3): 200 mg via ORAL
  Filled 2020-10-18 (×3): qty 1

## 2020-10-18 MED ORDER — LACTATED RINGERS IV SOLN: INTRAVENOUS | Status: DC

## 2020-10-18 MED ORDER — OXYCODONE HCL 5 MG PO TABS: 5.0000 mg | ORAL_TABLET | ORAL | Status: DC | PRN

## 2020-10-18 MED ORDER — PHENYLEPHRINE HCL (PRESSORS) 10 MG/ML IV SOLN
INTRAVENOUS | Status: AC
  Filled 2020-10-18: qty 1

## 2020-10-18 MED ORDER — FOLIC ACID 1 MG PO TABS
1.0000 mg | ORAL_TABLET | Freq: Every day | ORAL | Status: DC
  Administered 2020-10-18 – 2020-10-19 (×2): 1 mg via ORAL
  Filled 2020-10-18 (×2): qty 1

## 2020-10-18 MED ORDER — FENTANYL CITRATE (PF) 100 MCG/2ML IJ SOLN
INTRAMUSCULAR | Status: DC | PRN
  Administered 2020-10-18 (×4): 50 ug via INTRAVENOUS

## 2020-10-18 MED ORDER — METHOCARBAMOL 500 MG PO TABS
500.0000 mg | ORAL_TABLET | Freq: Four times a day (QID) | ORAL | Status: DC | PRN
  Administered 2020-10-18 (×2): 500 mg via ORAL
  Filled 2020-10-18: qty 1

## 2020-10-18 MED ORDER — FENTANYL CITRATE (PF) 100 MCG/2ML IJ SOLN
INTRAMUSCULAR | Status: AC
  Filled 2020-10-18: qty 2

## 2020-10-18 MED ORDER — THROMBIN 5000 UNITS EX SOLR
CUTANEOUS | Status: DC | PRN
  Administered 2020-10-18: 5000 [IU] via TOPICAL

## 2020-10-18 MED ORDER — DEXAMETHASONE SODIUM PHOSPHATE 10 MG/ML IJ SOLN
INTRAMUSCULAR | Status: AC
  Filled 2020-10-18: qty 1

## 2020-10-18 MED ORDER — FENTANYL CITRATE (PF) 100 MCG/2ML IJ SOLN
INTRAMUSCULAR | Status: AC
  Administered 2020-10-18: 50 ug via INTRAVENOUS
  Filled 2020-10-18: qty 2

## 2020-10-18 MED ORDER — CHLORHEXIDINE GLUCONATE 0.12 % MT SOLN
OROMUCOSAL | Status: AC
  Administered 2020-10-18: 15 mL via OROMUCOSAL
  Filled 2020-10-18: qty 15

## 2020-10-18 MED ORDER — MIDAZOLAM HCL 2 MG/2ML IJ SOLN
INTRAMUSCULAR | Status: DC | PRN
  Administered 2020-10-18: 2 mg via INTRAVENOUS

## 2020-10-18 MED ORDER — MIDAZOLAM HCL 2 MG/2ML IJ SOLN
INTRAMUSCULAR | Status: AC
  Filled 2020-10-18: qty 2

## 2020-10-18 MED ORDER — SUCCINYLCHOLINE CHLORIDE 20 MG/ML IJ SOLN
INTRAMUSCULAR | Status: DC | PRN
  Administered 2020-10-18: 100 mg via INTRAVENOUS

## 2020-10-18 MED ORDER — ACETAMINOPHEN 500 MG PO TABS
1000.0000 mg | ORAL_TABLET | Freq: Four times a day (QID) | ORAL | Status: DC
  Administered 2020-10-18 – 2020-10-19 (×3): 1000 mg via ORAL
  Filled 2020-10-18 (×3): qty 2

## 2020-10-18 MED ORDER — CEFAZOLIN SODIUM-DEXTROSE 2-4 GM/100ML-% IV SOLN
2.0000 g | INTRAVENOUS | Status: AC
  Administered 2020-10-18: 2 g via INTRAVENOUS

## 2020-10-18 MED ORDER — LIDOCAINE HCL (CARDIAC) PF 100 MG/5ML IV SOSY
PREFILLED_SYRINGE | INTRAVENOUS | Status: DC | PRN
  Administered 2020-10-18: 80 mg via INTRAVENOUS

## 2020-10-18 MED ORDER — HEMOSTATIC AGENTS (NO CHARGE) OPTIME
TOPICAL | Status: DC | PRN
  Administered 2020-10-18: 1 via TOPICAL

## 2020-10-18 MED ORDER — ONDANSETRON HCL 4 MG/2ML IJ SOLN
INTRAMUSCULAR | Status: DC | PRN
  Administered 2020-10-18: 4 mg via INTRAVENOUS

## 2020-10-18 MED ORDER — MENTHOL 3 MG MT LOZG
1.0000 | LOZENGE | OROMUCOSAL | Status: DC | PRN
  Filled 2020-10-18: qty 9

## 2020-10-18 MED ORDER — ORAL CARE MOUTH RINSE: 15.0000 mL | Freq: Once | OROMUCOSAL | Status: AC

## 2020-10-18 MED ORDER — CHLORHEXIDINE GLUCONATE 0.12 % MT SOLN: 15.0000 mL | Freq: Once | OROMUCOSAL | Status: AC

## 2020-10-18 MED ORDER — SODIUM CHLORIDE 0.9 % IV SOLN: 250.0000 mL | INTRAVENOUS | Status: DC

## 2020-10-18 MED ORDER — FAMOTIDINE 20 MG PO TABS
ORAL_TABLET | ORAL | Status: AC
  Administered 2020-10-18: 20 mg via ORAL
  Filled 2020-10-18: qty 1

## 2020-10-18 MED ORDER — REMIFENTANIL HCL 1 MG IV SOLR
INTRAVENOUS | Status: AC
  Filled 2020-10-18: qty 1000

## 2020-10-18 MED ORDER — SODIUM CHLORIDE 0.9% FLUSH: 3.0000 mL | Freq: Two times a day (BID) | INTRAVENOUS | Status: DC

## 2020-10-18 MED ORDER — REMIFENTANIL HCL 1 MG IV SOLR
INTRAVENOUS | Status: DC | PRN
  Administered 2020-10-18: .1 ug/kg/min via INTRAVENOUS

## 2020-10-18 MED ORDER — METHOCARBAMOL 500 MG PO TABS
ORAL_TABLET | ORAL | Status: AC
  Filled 2020-10-18: qty 1

## 2020-10-18 MED ORDER — POTASSIUM CHLORIDE IN NACL 20-0.9 MEQ/L-% IV SOLN
INTRAVENOUS | Status: DC
  Filled 2020-10-18 (×3): qty 1000

## 2020-10-18 MED ORDER — BUPIVACAINE-EPINEPHRINE (PF) 0.5% -1:200000 IJ SOLN
INTRAMUSCULAR | Status: DC | PRN
  Administered 2020-10-18: 5 mL

## 2020-10-18 MED ORDER — PHENOL 1.4 % MT LIQD
1.0000 | OROMUCOSAL | Status: DC | PRN
  Filled 2020-10-18: qty 177

## 2020-10-18 MED ORDER — OXYCODONE HCL 5 MG PO TABS
ORAL_TABLET | ORAL | Status: AC
  Filled 2020-10-18: qty 1

## 2020-10-18 MED ORDER — PHENYLEPHRINE HCL (PRESSORS) 10 MG/ML IV SOLN
INTRAVENOUS | Status: DC | PRN
  Administered 2020-10-18 (×2): 100 ug via INTRAVENOUS

## 2020-10-18 MED ORDER — METHOCARBAMOL 1000 MG/10ML IJ SOLN
500.0000 mg | Freq: Four times a day (QID) | INTRAVENOUS | Status: DC | PRN
  Filled 2020-10-18: qty 5

## 2020-10-18 MED ORDER — OXYCODONE HCL 5 MG PO TABS
10.0000 mg | ORAL_TABLET | ORAL | Status: DC | PRN
  Administered 2020-10-18 – 2020-10-19 (×5): 10 mg via ORAL
  Filled 2020-10-18 (×5): qty 2

## 2020-10-18 MED ORDER — FAMOTIDINE 20 MG PO TABS: 20.0000 mg | ORAL_TABLET | Freq: Once | ORAL | Status: AC

## 2020-10-18 MED ORDER — GENTAMICIN SULFATE 40 MG/ML IJ SOLN
5.0000 mg/kg | Freq: Once | INTRAVENOUS | Status: AC
  Administered 2020-10-18: 310 mg via INTRAVENOUS
  Filled 2020-10-18: qty 7.75

## 2020-10-18 MED ORDER — AMLODIPINE BESYLATE 5 MG PO TABS
5.0000 mg | ORAL_TABLET | Freq: Every day | ORAL | Status: DC
  Administered 2020-10-19: 5 mg via ORAL
  Filled 2020-10-18: qty 1

## 2020-10-18 MED ORDER — ACETAMINOPHEN 650 MG RE SUPP: 650.0000 mg | RECTAL | Status: DC | PRN

## 2020-10-18 MED ORDER — ROCURONIUM BROMIDE 10 MG/ML (PF) SYRINGE
PREFILLED_SYRINGE | INTRAVENOUS | Status: AC
  Filled 2020-10-18: qty 10

## 2020-10-18 MED ORDER — ONDANSETRON HCL 4 MG/2ML IJ SOLN
INTRAMUSCULAR | Status: AC
  Filled 2020-10-18: qty 2

## 2020-10-18 MED ORDER — CEFAZOLIN SODIUM-DEXTROSE 2-4 GM/100ML-% IV SOLN
INTRAVENOUS | Status: AC
  Filled 2020-10-18: qty 100

## 2020-10-18 MED ORDER — NITROFURANTOIN MONOHYD MACRO 100 MG PO CAPS
100.0000 mg | ORAL_CAPSULE | Freq: Two times a day (BID) | ORAL | Status: DC
  Administered 2020-10-18 – 2020-10-19 (×3): 100 mg via ORAL
  Filled 2020-10-18 (×4): qty 1

## 2020-10-18 MED ORDER — PROPOFOL 10 MG/ML IV BOLUS
INTRAVENOUS | Status: DC | PRN
  Administered 2020-10-18: 50 mg via INTRAVENOUS
  Administered 2020-10-18: 120 mg via INTRAVENOUS

## 2020-10-18 MED ORDER — BUPIVACAINE-EPINEPHRINE (PF) 0.5% -1:200000 IJ SOLN
INTRAMUSCULAR | Status: AC
  Filled 2020-10-18: qty 30

## 2020-10-18 MED ORDER — THROMBIN 5000 UNITS EX SOLR
CUTANEOUS | Status: AC
  Filled 2020-10-18: qty 5000

## 2020-10-18 MED ORDER — ONDANSETRON HCL 4 MG PO TABS
4.0000 mg | ORAL_TABLET | Freq: Four times a day (QID) | ORAL | Status: DC | PRN
  Administered 2020-10-18: 4 mg via ORAL
  Filled 2020-10-18: qty 1

## 2020-10-18 MED ORDER — 0.9 % SODIUM CHLORIDE (POUR BTL) OPTIME
TOPICAL | Status: DC | PRN
  Administered 2020-10-18: 1000 mL

## 2020-10-18 MED ORDER — LIDOCAINE HCL (PF) 2 % IJ SOLN
INTRAMUSCULAR | Status: AC
  Filled 2020-10-18: qty 5

## 2020-10-18 MED ORDER — ONDANSETRON HCL 4 MG/2ML IJ SOLN: 4.0000 mg | Freq: Four times a day (QID) | INTRAMUSCULAR | Status: DC | PRN

## 2020-10-18 MED ORDER — OXYCODONE HCL 5 MG PO TABS: 5.0000 mg | ORAL_TABLET | Freq: Once | ORAL | Status: AC | PRN

## 2020-10-18 MED ORDER — ACETAMINOPHEN 325 MG PO TABS: 650.0000 mg | ORAL_TABLET | ORAL | Status: DC | PRN

## 2020-10-18 MED ORDER — PROPOFOL 10 MG/ML IV BOLUS
INTRAVENOUS | Status: AC
  Filled 2020-10-18: qty 40

## 2020-10-18 SURGICAL SUPPLY — 63 items
ADH SKN CLS APL DERMABOND .7 (GAUZE/BANDAGES/DRESSINGS) ×1
AGENT HMST MTR 8 SURGIFLO (HEMOSTASIS) ×1
APL PRP STRL LF DISP 70% ISPRP (MISCELLANEOUS) ×2
BASKET BONE COLLECTION (BASKET) IMPLANT
BULB RESERV EVAC DRAIN JP 100C (MISCELLANEOUS) IMPLANT
BUR NEURO DRILL SOFT 3.0X3.8M (BURR) ×2 IMPLANT
CHLORAPREP W/TINT 26 (MISCELLANEOUS) ×4 IMPLANT
COUNTER NEEDLE 20/40 LG (NEEDLE) ×2 IMPLANT
CUP MEDICINE 2OZ PLAST GRAD ST (MISCELLANEOUS) ×2 IMPLANT
DERMABOND ADVANCED (GAUZE/BANDAGES/DRESSINGS) ×1
DERMABOND ADVANCED .7 DNX12 (GAUZE/BANDAGES/DRESSINGS) ×1 IMPLANT
DRAIN CHANNEL JP 10F RND 20C F (MISCELLANEOUS) IMPLANT
DRAPE C ARM PK CFD 31 SPINE (DRAPES) ×2 IMPLANT
DRAPE C-ARM XRAY 36X54 (DRAPES) ×2 IMPLANT
DRAPE LAPAROTOMY 77X122 PED (DRAPES) ×2 IMPLANT
DRAPE MICROSCOPE SPINE 48X150 (DRAPES) ×2 IMPLANT
DRAPE SURG 17X11 SM STRL (DRAPES) ×8 IMPLANT
DRSG OPSITE POSTOP 4X6 (GAUZE/BANDAGES/DRESSINGS) IMPLANT
ELECT CAUTERY BLADE TIP 2.5 (TIP) ×2
ELECT REM PT RETURN 9FT ADLT (ELECTROSURGICAL) ×2
ELECTRODE CAUTERY BLDE TIP 2.5 (TIP) ×1 IMPLANT
ELECTRODE REM PT RTRN 9FT ADLT (ELECTROSURGICAL) ×1 IMPLANT
FEE INTRAOP CADWELL SUPPLY NCS (MISCELLANEOUS) ×1 IMPLANT
FEE INTRAOP MONITOR IMPULS NCS (MISCELLANEOUS) IMPLANT
GAUZE 4X4 16PLY ~~LOC~~+RFID DBL (SPONGE) ×2 IMPLANT
GLOVE SURG SYN 8.5  E (GLOVE) ×3
GLOVE SURG SYN 8.5 E (GLOVE) ×3 IMPLANT
GOWN SRG XL LVL 3 NONREINFORCE (GOWNS) ×1 IMPLANT
GOWN STRL NON-REIN TWL XL LVL3 (GOWNS) ×2
GOWN STRL REUS W/ TWL XL LVL3 (GOWN DISPOSABLE) ×1 IMPLANT
GOWN STRL REUS W/TWL XL LVL3 (GOWN DISPOSABLE) ×2
GRADUATE 1200CC STRL 31836 (MISCELLANEOUS) ×2 IMPLANT
INTRAOP CADWELL SUPPLY FEE NCS (MISCELLANEOUS) ×1
INTRAOP DISP SUPPLY FEE NCS (MISCELLANEOUS) ×2
INTRAOP MONITOR FEE IMPULS NCS (MISCELLANEOUS)
INTRAOP MONITOR FEE IMPULSE (MISCELLANEOUS)
KIT TURNOVER KIT A (KITS) ×2 IMPLANT
MANIFOLD NEPTUNE II (INSTRUMENTS) ×2 IMPLANT
MARKER SKIN DUAL TIP RULER LAB (MISCELLANEOUS) ×4 IMPLANT
NDL SAFETY ECLIPSE 18X1.5 (NEEDLE) ×1 IMPLANT
NEEDLE HYPO 18GX1.5 SHARP (NEEDLE) ×2
NEEDLE HYPO 22GX1.5 SAFETY (NEEDLE) ×2 IMPLANT
NS IRRIG 1000ML POUR BTL (IV SOLUTION) ×2 IMPLANT
PACK LAMINECTOMY NEURO (CUSTOM PROCEDURE TRAY) ×2 IMPLANT
PAD ARMBOARD 7.5X6 YLW CONV (MISCELLANEOUS) ×4 IMPLANT
PIN CASPAR 14 (PIN) ×1 IMPLANT
PIN CASPAR 14MM (PIN) ×2
PLATE ANT CERV XTEND 2 LV 26 (Plate) ×2 IMPLANT
PUTTY DBX 1CC (Putty) ×2 IMPLANT
PUTTY DBX 1CC DEPUY (Putty) ×1 IMPLANT
SCREW VAR 4.2 XD SELF DRILL 16 (Screw) ×12 IMPLANT
SPACER C HEDRON 12X14 7M 7D (Spacer) ×4 IMPLANT
SPOGE SURGIFLO 8M (HEMOSTASIS) ×1
SPONGE KITTNER 5P (MISCELLANEOUS) ×2 IMPLANT
SPONGE SURGIFLO 8M (HEMOSTASIS) ×1 IMPLANT
STAPLER SKIN PROX 35W (STAPLE) IMPLANT
SUT V-LOC 90 ABS DVC 3-0 CL (SUTURE) ×2 IMPLANT
SUT VIC AB 3-0 SH 8-18 (SUTURE) ×2 IMPLANT
SYR 30ML LL (SYRINGE) ×2 IMPLANT
TAPE CLOTH 3X10 WHT NS LF (GAUZE/BANDAGES/DRESSINGS) ×2 IMPLANT
TOWEL OR 17X26 4PK STRL BLUE (TOWEL DISPOSABLE) ×6 IMPLANT
TRAY FOLEY MTR SLVR 16FR STAT (SET/KITS/TRAYS/PACK) IMPLANT
TUBING CONNECTING 10 (TUBING) ×2 IMPLANT

## 2020-10-18 NOTE — Progress Notes (Signed)
PHARMACY -  BRIEF ANTIBIOTIC NOTE   Pharmacy has received consult(s) for Cefazolin from an OR provider.  The patient's profile has been reviewed for ht/wt/allergies/indication/available labs.    One time order(s) placed for Cefazolin 2 gm once preop.  Further antibiotics/pharmacy consults should be ordered by admitting physician if indicated.                       Otelia Sergeant, PharmD, [facility] 10/18/2020 6:22 AM

## 2020-10-18 NOTE — Transfer of Care (Signed)
Immediate Anesthesia Transfer of Care Note  Patient: Jillian Ward  Procedure(s) Performed: C4-6 ANTERIOR CERVICAL DECOMPRESSION/DISCECTOMY FUSION 2 LEVELS  Patient Location: PACU  Anesthesia Type:General  Level of Consciousness: awake, alert  and oriented  Airway & Oxygen Therapy: Patient Spontanous Breathing and Patient connected to face mask oxygen  Post-op Assessment: Report given to RN and Post -op Vital signs reviewed and stable  Post vital signs: Reviewed and stable  Last Vitals:  Vitals Value Taken Time  BP    Temp    Pulse    Resp    SpO2      Last Pain:  Vitals:   10/18/20 0626  TempSrc: Temporal  PainSc: 6          Complications: No notable events documented.

## 2020-10-18 NOTE — H&P (Signed)
I have reviewed and confirmed my history and physical from 10/12/20 with no additions or changes. Plan for C4-6 ACDF.  Risks and benefits reviewed.  Heart sounds normal no MRG. Chest Clear to Auscultation Bilaterally.

## 2020-10-18 NOTE — TOC Progression Note (Signed)
Transition of Care [facility]) - Progression Note    Patient Details  Name: Jillian Ward MRN: [id number] Date of Birth: July 21, 1965  Transition of [facility]) CM/SW Contact  Caryn Section, RN Phone Number: 10/18/2020, 4:30 PM  Clinical Narrative:   TOC in to see patient and spouse at bedside.  Patient lives with spouse, who can assist her at home if needed.  She states she has no issues getting to appointments and getting medications, and can take medications as directed.  Patient currently has no home health services.  [facility] can provide home health PT/OT for patient, confirmed with [place] at agency.  Patient has no concerns about returning home.  TOC contact information given, TOC to follow to discharge.         Expected Discharge Plan and Services                                                 Social Determinants of Health (SDOH) Interventions    Readmission Risk Interventions No flowsheet data found.

## 2020-10-18 NOTE — Anesthesia Postprocedure Evaluation (Signed)
Anesthesia Post Note  Patient: Jillian Ward  Procedure(s) Performed: C4-6 ANTERIOR CERVICAL DECOMPRESSION/DISCECTOMY FUSION 2 LEVELS  Patient location during evaluation: PACU Anesthesia Type: General Level of consciousness: awake and alert Pain management: pain level controlled Vital Signs Assessment: post-procedure vital signs reviewed and stable Respiratory status: spontaneous breathing, nonlabored ventilation, respiratory function stable and patient connected to nasal cannula oxygen Cardiovascular status: blood pressure returned to baseline and stable Postop Assessment: no apparent nausea or vomiting Anesthetic complications: no   No notable events documented.   Last Vitals:  Vitals:   10/18/20 1015 10/18/20 1030  BP: (!) 123/98 122/90  Pulse: (!) 106 (!) 108  Resp: 17 (!) 21  Temp:  36.4 C  SpO2: 96% 95%    Last Pain:  Vitals:   10/18/20 1015  TempSrc:   PainSc: 7                  Cleda Mccreedy Reyes [place]

## 2020-10-18 NOTE — Anesthesia Procedure Notes (Signed)
Procedure Name: Intubation Date/Time: 10/18/2020 7:29 AM Performed by: Clyde Lundborg, CRNA Pre-anesthesia Checklist: Patient identified, Emergency Drugs available, Suction available and Patient being monitored Patient Re-evaluated:Patient Re-evaluated prior to induction Oxygen Delivery Method: Circle system utilized Preoxygenation: Pre-oxygenation with 100% oxygen Induction Type: IV induction Ventilation: Mask ventilation without difficulty Laryngoscope Size: McGraph and 3 Grade View: Grade I Tube type: Oral Tube size: 6.5 mm Number of attempts: 1 Airway Equipment and Method: Stylet and Oral airway Placement Confirmation: ETT inserted through vocal cords under direct vision, positive ETCO2 and breath sounds checked- equal and bilateral Tube secured with: Tape Dental Injury: Teeth and Oropharynx as per pre-operative assessment

## 2020-10-18 NOTE — Op Note (Signed)
Indications: Jillian Ward is a 55 yo female who presented with cervical radiculopathy and anterolisthesis.  She failed conservative management.  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   Preoperative Note:   Risks of surgery discussed include: infection, bleeding, stroke, coma, death, paralysis, CSF leak, nerve/spinal cord injury, numbness, tingling, weakness, complex regional pain syndrome, recurrent stenosis and/or disc herniation, vascular injury, development of instability, neck/back pain, need for further surgery, persistent symptoms, development of deformity, and the risks of anesthesia. The patient understood these risks and agreed to proceed.  Operative Note:   Procedure:  1) Anterior cervical diskectomy and fusion at C4/5 and C5/6 2) Anterior cervical instrumentation at C4 - 6 using Globus Xtend 3) Placement of biomechanical devices at C4/5 and C5/6  4) Use of operative microscope 5) Use of flouroscopy   Procedure: After obtaining informed consent, the patient taken to the operating room, placed in supine position, general anesthesia induced.  The patient had a small shoulder roll placed behind their shoulders.  The patient received preop antibiotics and IV Decadron.  The patient had a neck incision outlined, was prepped and draped in usual sterile fashion. The incision was injected with local anesthetic.   An incision was opened, dissection taken down medial to the carotid artery and jugular vein, lateral to the trachea and esophagus.  The prevertebral fascia identified and a localizing x-ray demonstrated the correct level.  The longus colli were dissected laterally, and self-retaining retractors placed to open the operative field. The microscope was  then brought into the field.  With this complete, distractor pins were placed in the vertebral bodies of C4 and C6. The distractor was placed, and the annuli at C4/5 and C5/6 were opened using a bovie.  Curettes and pituitary rongeurs used to remove the majority of disk, then the drill was used to remove the posterior osteophyte and begin the foraminotomies. The nerve hook was used to elevate the posterior longitudinal ligament, which was then removed with Kerrison rongeurs. The microblunt nerve hook could be passed out the foramina bilaterally at each level.   Meticulous hemostasis was obtained.  A biomechanical device (Globus Hedron 7 mm height x 14 mm width by 12 mm depth) was placed at C4/5. A second biomechanical device (Globus Hedron 7 mm height x 14 mm width by 12 mm depth) was placed at C5/6. Each device had been filled with allograft for aid in arthrodesis.  The caspar distractor was removed, and bone wax used for hemostasis. A 26 mm Globus Xtend plate was chosen.  Two screws placed in each vertebral body, respectively making sure the screws were behind the locking mechanism.  Final AP and lateral radiographs were taken.   With everything in good position, the wound was irrigated copiously with bacitracin-containing solution and meticulous hemostasis obtained.  Wound was closed in 2 layers using interrupted inverted 3-0 Vicryl sutures in the platysma and 3-0 monocryl on the dermis.  The wound was dressed with dermabond, the head of bed at 30 degrees, taken to recovery room in stable condition.  No new postop neurological deficits were identified.  Sponge and pattie counts were correct at the end of the procedure.     I performed the entire procedure with the assistance of Anabel Halon PA as an Designer, television/film set.  Venetia Night MD

## 2020-10-18 NOTE — Anesthesia Preprocedure Evaluation (Signed)
Anesthesia Evaluation  Patient identified by MRN, date of birth, ID band Patient awake    Reviewed: Allergy & Precautions, NPO status , Patient's Chart, lab work & pertinent test results  History of Anesthesia Complications Negative for: history of anesthetic complications  Airway Mallampati: III  TM Distance: >3 FB Neck ROM: full    Dental  (+) Chipped, Poor Dentition   Pulmonary shortness of breath and with exertion, sleep apnea , Current Smoker and Patient abstained from smoking.,    Pulmonary exam normal        Cardiovascular Exercise Tolerance: Good hypertension, (-) anginaNormal cardiovascular exam     Neuro/Psych PSYCHIATRIC DISORDERS negative neurological ROS     GI/Hepatic negative GI ROS, Neg liver ROS,   Endo/Other  negative endocrine ROS  Renal/GU      Musculoskeletal   Abdominal   Peds  Hematology negative hematology ROS (+)   Anesthesia Other Findings Past Medical History: No date: Anxiety No date: Arthritis     Comment:  rheumatoid No date: Depression No date: Dyspnea No date: Hypertension No date: Nodule of right lung No date: Rib fracture  Past Surgical History: No date: APPENDECTOMY No date: CESAREAN SECTION 01/15/2016: COLONOSCOPY WITH PROPOFOL; N/A     Comment:  Procedure: COLONOSCOPY WITH PROPOFOL;  Surgeon: Scot Jun, MD;  Location: [facility] ENDOSCOPY;  Service:               Endoscopy;  Laterality: N/A; 11/03/2019: DUPUYTREN CONTRACTURE RELEASE; Left     Comment:  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.;  Surgeon: Christena Flake, MD;  Location: [facility] ORS;  Service: Orthopedics;                Laterality: Left; 2005: HERNIA REPAIR     Comment:  ABDOMINAL No date: HERNIA REPAIR     Comment:  UMBILICAL     Reproductive/Obstetrics negative OB ROS                              Anesthesia Physical Anesthesia Plan  ASA: 3  Anesthesia Plan: General ETT   Post-op Pain Management:    Induction: Intravenous  PONV Risk Score and Plan: Ondansetron, Dexamethasone, Midazolam and Treatment may vary due to age or medical condition  Airway Management Planned: Oral ETT  Additional Equipment:   Intra-op Plan:   Post-operative Plan: Extubation in OR  Informed Consent: I have reviewed the patients History and Physical, chart, labs and discussed the procedure including the risks, benefits and alternatives for the proposed anesthesia with the patient or authorized representative who has indicated his/her understanding and acceptance.     Dental Advisory Given  Plan Discussed with: Anesthesiologist, CRNA and Surgeon  Anesthesia Plan Comments: (Patient consented for risks of anesthesia including but not limited to:  - adverse reactions to medications - damage to eyes, teeth, lips or other oral mucosa - nerve damage due to positioning  - sore throat or hoarseness - Damage to heart, brain, nerves, lungs, other parts of body or loss of life  Patient voiced understanding.)        Anesthesia Quick Evaluation

## 2020-10-18 NOTE — Evaluation (Signed)
Occupational Therapy Evaluation Patient Details Name: Jillian Ward MRN: [id number] DOB: November 30, 1965 Today's Date: 10/18/2020    History of Present Illness Pt is 55 y/o female s/p C4-6 ANTERIOR CERVICAL DECOMPRESSION/DISCECTOMY FUSION 2 LEVELS.   Clinical Impression   Pt supine in bed with husband present upon entering the room. Pt reports 9/10 pain posterior neck (not incision). Pt reports this is her "normal" level of pain prior to surgery. Pt also reports tingling/radiating pain down B UEs which also was present before surgery but is not better or worse from surgical intervention at this time. Pt is agreeable to OT intervention. Pt performs bed mobility without assistance. Pt obtains personal items and stands at sink for grooming tasks without use of AD. Pt ambulating with supervision to bathroom for toileting need with no physical assistance but supervision for safety. Pt returns to bed and ice applied to posterior neck per pt request and RN present in room. Pt lives with husband independently at baseline. She does not use AD for functional mobility and her bedroom is on 2nd floor of home. Pt reports no concerns about going home and caring for herself at discharge. Her husband will be available to provide assist as needed. OT to SIGN OFF at this time.     Follow Up Recommendations  No OT follow up;Supervision - Intermittent    Equipment Recommendations  None recommended by OT       Precautions / Restrictions Precautions Precautions: Cervical Restrictions Other Position/Activity Restrictions: no brace needed per MD order      Mobility Bed Mobility Overal bed mobility: Modified Independent                  Transfers Overall transfer level: Needs assistance   Transfers: Sit to/from Stand;Stand Pivot Transfers Sit to Stand: Supervision Stand pivot transfers: Supervision       General transfer comment: for safety and therapist assisting with IV pole in room. No  physical assist given.    Balance Overall balance assessment: Modified Independent Sitting-balance support: Feet supported Sitting balance-Leahy Scale: Normal       Standing balance-Leahy Scale: Good                             ADL either performed or assessed with clinical judgement   ADL                                         General ADL Comments: supervision overall- no physical assist needed.     Vision Baseline Vision/History: Wears glasses Wears Glasses: Reading only Patient Visual Report: No change from baseline              Pertinent Vitals/Pain Pain Assessment: 0-10 Pain Score: 9  Pain Location: posterior neck (not incision ) Pain Descriptors / Indicators: Aching;Discomfort Pain Intervention(s): Limited activity within patient's tolerance     Hand Dominance Right   Extremity/Trunk Assessment Upper Extremity Assessment Upper Extremity Assessment: RUE deficits/detail;LUE deficits/detail RUE Deficits / Details: WFLs for tasks with pt reporting radiating tingling/pain before and after surgery- no change with surgical intervention LUE Deficits / Details: WFLs for tasks with pt reporting radiating tingling/pain before and after surgery- no change with surgical intervention   Lower Extremity Assessment Lower Extremity Assessment: Overall WFL for tasks assessed       Communication Communication Communication: No difficulties  Cognition Arousal/Alertness: Awake/alert Behavior During Therapy: WFL for tasks assessed/performed Overall Cognitive Status: Within Functional Limits for tasks assessed                                                Home Living Family/patient expects to be discharged to:: Private residence Living Arrangements: Spouse/significant other Available Help at Discharge: Available 24 hours/day Type of Home: House Home Access: Stairs to enter [employer] of Steps: 5 Entrance  Stairs-Rails: Left Home Layout: Two level;Bed/bath upstairs Alternate Level Stairs-Number of Steps: flight -14   Bathroom Shower/Tub: Tub/shower unit         Home Equipment: None          Prior Functioning/Environment Level of Independence: Independent        Comments: Pt reports I with self care and IADL tasks PTA. Pt endorsed being in high level of pain at baseline and husband assists with IADLs just from a pain management consideration.                 OT Goals(Current goals can be found in the care plan section) Acute Rehab OT Goals Patient Stated Goal: to decrease pain and go home OT Goal Formulation: With patient Time For Goal Achievement: 10/18/20 Potential to Achieve Goals: Good   AM-PAC OT "6 Clicks" Daily Activity     Outcome Measure Help from another person eating meals?: None Help from another person taking care of personal grooming?: None Help from another person toileting, which includes using toliet, bedpan, or urinal?: A Little Help from another person bathing (including washing, rinsing, drying)?: A Little Help from another person to put on and taking off regular upper body clothing?: None Help from another person to put on and taking off regular lower body clothing?: A Little 6 Click Score: 21   End of Session Nurse Communication: Mobility status;Patient requests pain meds  Activity Tolerance: Patient limited by pain Patient left: in bed;with call bell/phone within reach;with bed alarm set                   Time: 8546-2703 OT Time Calculation (min): 29 min Charges:  OT General Charges $OT Visit: 1 Visit OT Evaluation $OT Eval Low Complexity: 1 Low OT Treatments $Self Care/Home Management : 23-37 mins  Jackquline Denmark, MS, OTR/L , CBIS ascom [phone]  10/18/20, 4:34 PM

## 2020-10-18 NOTE — TOC Progression Note (Signed)
Transition of Care [facility]) - Progression Note    Patient Details  Name: Jillian Ward MRN: [id number] Date of Birth: [birth year]  Transition of [facility]) CM/SW Contact  Caryn Section, RN Phone Number: 10/18/2020, 3:11 PM  Clinical Narrative:   Per [place] [facility], patient will need outpatient therapy as they are unable to accept her insurance.  Patient is aware.         Expected Discharge Plan and Services                                                 Social Determinants of Health (SDOH) Interventions    Readmission Risk Interventions No flowsheet data found.

## 2020-10-19 MED ORDER — METHOCARBAMOL 500 MG PO TABS: 500.0000 mg | ORAL_TABLET | Freq: Four times a day (QID) | ORAL | 0 refills | Status: DC | PRN

## 2020-10-19 MED ORDER — OXYCODONE-ACETAMINOPHEN 5-325 MG PO TABS: 1.0000 | ORAL_TABLET | ORAL | 0 refills | Status: AC | PRN

## 2020-10-19 MED ORDER — CELECOXIB 200 MG PO CAPS: 200.0000 mg | ORAL_CAPSULE | Freq: Two times a day (BID) | ORAL | 0 refills | Status: DC

## 2020-10-19 MED ORDER — ACETAMINOPHEN 325 MG PO TABS: 650.0000 mg | ORAL_TABLET | ORAL | Status: AC | PRN

## 2020-10-19 NOTE — Evaluation (Signed)
Physical Therapy Evaluation Patient Details Name: Jillian Ward MRN: [id number] DOB: 04-12-1965 Today's Date: 10/19/2020   History of Present Illness  Mylynn Dinh is a 55 y.o. s/p C4-C6 ACDF for cervical radiculopathy.  Clinical Impression  Pt admitted with above diagnosis. Pt received sitting up in bed eating breakfast agreeable to PT eval. Pt reports 6/10 pain NPS in post cervical spine (not at incision) and denies N/T that was reported the day prior. Pt educated on cervical precautions with pt verbalizing understanding. Pt mod-I for all mobility today. Amb >300' with PT and demo'd safe asc/desc 4 stairs x3 with use of R hand rail to demo safe asc/desc to enter/exit home and asc/desc second story of home with safe step through technique. Pt returned to room and educated on log roll technique for bed mobility for pain modulation and maintaining cervical precautions. X2 attempts with intermittent multimodal cues to perform on first attempt and good carryover with second attempt with reports of less pain with log roll technique. Pt has no further acute PT needs being mod-I for all mobility assessed and good understanding of precautions and log roll technique. PT to sign off. Pt will benefit from skilled PT to increase their independence and safety with mobility to allow discharge to the venue listed below.       Follow Up Recommendations Outpatient PT;Follow surgeon's recommendation for DC plan and follow-up therapies    Equipment Recommendations  None recommended by PT    Recommendations for Other Services       Precautions / Restrictions Precautions Precautions: Cervical Restrictions Weight Bearing Restrictions: No Other Position/Activity Restrictions: no brace needed per MD order      Mobility  Bed Mobility Overal bed mobility: Modified Independent                  Transfers Overall transfer level: Modified independent   Transfers: Sit to/from Stand Sit to  Stand: Modified independent (Device/Increase time)         General transfer comment: No longer attached to IV pole  Ambulation/Gait   Gait Distance (Feet): 250 Feet Assistive device: None Gait Pattern/deviations: WFL(Within Functional Limits)        Stairs            Wheelchair Mobility    Modified Rankin (Stroke Patients Only)       Balance Overall balance assessment: Modified Independent Sitting-balance support: Feet supported;No upper extremity supported Sitting balance-Leahy Scale: Normal       Standing balance-Leahy Scale: Good                 High Level Balance Comments: Able to dual task with ambullating and hold conversation             Pertinent Vitals/Pain Pain Assessment: 0-10 Pain Score: 6  Pain Location: posterior neck (not incision ) Pain Descriptors / Indicators: Aching;Discomfort Pain Intervention(s): Limited activity within patient's tolerance;Repositioned    Home Living Family/patient expects to be discharged to:: Private residence Living Arrangements: Spouse/significant other Available Help at Discharge: Available PRN/intermittently (pt reports husband works during the day.) Type of Home: House Home Access: Stairs to enter Entrance Stairs-Rails: [employer]icult[employer], commercial of Steps: 5 Home Layout: Two level;Bed/bath upstairs Home Equipment: None      Prior Function Level of Independence: Independent               Hand Dominance   Dominant Hand: Right    Extremity/Trunk Assessment   Upper Extremity Assessment Upper Extremity Assessment: Defer  to OT evaluation RUE Deficits / Details: Pt denies N/T today LUE Deficits / Details: Pt denies N/T today    Lower Extremity Assessment Lower Extremity Assessment: Overall WFL for tasks assessed    Cervical / Trunk Assessment Cervical / Trunk Assessment: Normal  Communication   Communication: No difficulties  Cognition Arousal/Alertness: Awake/alert Behavior  During Therapy: WFL for tasks assessed/performed Overall Cognitive Status: Within Functional Limits for tasks assessed                                        General Comments      Exercises Other Exercises Other Exercises: Education on cervical precautions, logroll technique for bed mobility.   Assessment/Plan    PT Assessment Patent does not need any further PT services;All further PT needs can be met in the next venue of care  PT Problem List Decreased range of motion;Decreased activity tolerance;Pain       PT Treatment Interventions      PT Goals (Current goals can be found in the Care Plan section)  Acute Rehab PT Goals Patient Stated Goal: to decrease pain and go home PT Goal Formulation: With patient Time For Goal Achievement: 10/26/20 Potential to Achieve Goals: Good    Frequency     Barriers to discharge        Co-evaluation               AM-PAC PT "6 Clicks" Mobility  Outcome Measure Help needed turning from your back to your side while in a flat bed without using bedrails?: None Help needed moving from lying on your back to sitting on the side of a flat bed without using bedrails?: None Help needed moving to and from a bed to a chair (including a wheelchair)?: None Help needed standing up from a chair using your arms (e.g., wheelchair or bedside chair)?: None Help needed to walk in hospital room?: None Help needed climbing 3-5 steps with a railing? : A Little 6 Click Score: 23    End of Session   Activity Tolerance: Patient tolerated treatment well;No increased pain Patient left: in bed   PT Visit Diagnosis: Pain Pain - part of body:  (cervical spine)    Time: 4847-2072 PT Time Calculation (min) (ACUTE ONLY): 17 min   Charges:   PT Evaluation $PT Eval Low Complexity: 1 Low PT Treatments $Therapeutic Activity: 8-22 mins        Hedaya Latendresse M. Fairly IV, PT, DPT Physical Therapist- [place]  10/19/2020, 9:30 AM

## 2020-10-19 NOTE — TOC Progression Note (Signed)
Transition of Care [facility]) - Progression Note    Patient Details  Name: Jillian Ward MRN: [id number] Date of Birth: 02/13/1966  [facility]) CM/SW Contact  Caryn Section, RN Phone Number: 10/19/2020, 10:43 AM  Clinical Narrative:   Patient is being discharged today, as per provider for this admission, patient will not need Home Health or out patient physical therapy, patient will be reassessed at follow up visit.  Patient has no further concerns about discharge at this time.           Expected Discharge Plan and Services           Expected Discharge Date: 10/19/20                                     Social Determinants of Health (SDOH) Interventions    Readmission Risk Interventions No flowsheet data found.

## 2020-10-19 NOTE — Discharge Instructions (Signed)
Your surgeon has performed an operation on your cervical spine (neck) to relieve pressure on the spinal cord and/or nerves. This involved making an incision in the front of your neck and removing one or more of the discs that support your spine. Next, a small piece of bone, a titanium plate, and screws were used to fuse two or more of the vertebrae (bones) together.  The following are instructions to help in your recovery once you have been discharged from the hospital. Even if you feel well, it is important that you follow these activity guidelines. If you do not let your neck heal properly from the surgery, you can increase the chance of return of your symptoms and other complications.  * Do not take anti-inflammatory medications for 3 months after surgery (naproxen [Aleve], ibuprofen [Advil, Motrin],  etc.). These medications can prevent your bones from healing properly.  Celebrex is OK. Do not resume Humira for at least 3 weeks.   Activity    No bending, lifting, or twisting ("BLT"). Avoid lifting objects heavier than 10 pounds (gallon milk jug).  Where possible, avoid household activities that involve lifting, bending, reaching, pushing, or pulling such as laundry, vacuuming, grocery shopping, and childcare. Try to arrange for help from friends and family for these activities while your back heals.  Increase physical activity slowly as tolerated.  Taking short walks is encouraged, but avoid strenuous exercise. Do not jog, run, bicycle, lift weights, or participate in any other exercises unless specifically allowed by your doctor.  Talk to your doctor before resuming sexual activity.  You should not drive until cleared by your doctor.  Until released by your doctor, you should not return to work or school.  You should rest at home and let your body heal.   You may shower two days after your surgery.  After showering, lightly dab your incision dry. Do not take a tub bath or go swimming until  approved by your doctor at your follow-up appointment.  If your doctor ordered a cervical collar (neck brace) for you, you should wear it whenever you are out of bed. You may remove it when lying down or sleeping, but you should wear it at all other times. Not all neck surgeries require a cervical collar.  If you smoke, we strongly recommend that you quit.  Smoking has been proven to interfere with normal bone healing and will dramatically reduce the success rate of your surgery. Please contact [facility] (800-QUIT-NOW) and use the resources at www.[facility].com for assistance in stopping smoking.  Surgical Incision   If you have a dressing on your incision, you may remove it two days after your surgery. Keep your incision area clean and dry.  If you have staples or stitches on your incision, you should have a follow up scheduled for removal. If you do not have staples or stitches, you will have steri-strips (small pieces of surgical tape) or Dermabond glue. The steri-strips/glue should begin to peel away within about a week (it is fine if the steri-strips fall off before then). If the strips are still in place one week after your surgery, you may gently remove them.  Diet           You may return to your usual diet. However, you may experience discomfort when swallowing in the first month after your surgery. This is normal. You may find that softer foods are more comfortable for you to swallow. Be sure to stay hydrated.  When to Contact us  You may experience pain in your neck and/or pain between your shoulder blades. This is normal and should improve in the next few weeks with the help of pain medication, muscle relaxers, and rest. Some patients report that a warm compress on the back of the neck or between the shoulder blades helps.  However, should you experience any of the following, contact us immediately: New numbness or weakness Pain that is progressively getting worse, and is not  relieved by your pain medication, muscle relaxers, rest, and warm compresses Bleeding, redness, swelling, pain, or drainage from surgical incision Chills or flu-like symptoms Fever greater than 101.0 F (38.3 C) Inability to eat, drink fluids, or take medications Problems with bowel or bladder functions Difficulty breathing or shortness of breath Warmth, tenderness, or swelling in your calf Contact Information During office hours (Monday-Friday 9 am to 5 pm), please call your physician at [phone] and ask for Sharlot Gowda After hours and weekends, please call [phone] and speak with the answering service, who will contact the doctor on call.  If that fails, call the [facility] Operator at [phone] and ask for the Neurosurgery Resident On Call  For a life-threatening emergency, call 911

## 2020-10-19 NOTE — Discharge Summary (Addendum)
Physician Discharge Summary  Patient ID: Jillian Ward MRN: [id number] DOB/AGE: 10-20-1965 55 y.o.  Admit date: 10/18/2020 Discharge date: 10/19/2020  Admission Diagnoses: cervical radiculopathy, anterolisthesis  Discharge Diagnoses:  Active Problems:   Cervical radiculopathy   Discharged Condition: good  Hospital Course: Jillian Ward is a 55 yo female who presented with cervical radiculopathy and anterolisthesis.  She did well with surgery and was stable for discharge on POD1.  Consults: None  Significant Diagnostic Studies: radiology: X-Ray: localization during surgery  Treatments: surgery: ACDF C4-6  Discharge Exam: Blood pressure (!) 133/94, pulse 78, temperature 97.9 F (36.6 C), resp. rate 16, height 5\' 2"  (1.575 m), weight 62.1 kg, SpO2 99 %. General appearance: alert and cooperative CNI 5/5 throughout BUE  Disposition: Discharge disposition: 01-Home or Self Care       Discharge Instructions     Discharge patient   Complete by: As directed    Discharge disposition: 01-Home or Self Care   Discharge patient date: 10/19/2020   Incentive spirometry RT   Complete by: As directed       Allergies as of 10/19/2020       Reactions   Penicillins Hives   Iodinated Diagnostic Agents Hives, Rash        Medication List     TAKE these medications    acetaminophen 325 MG tablet Commonly known as: TYLENOL Take 2 tablets (650 mg total) by mouth every 4 (four) hours as needed for mild pain ((score 1 to 3) or temp > 100.5).   Adalimumab 40 MG/0.8ML Pskt Inject 40 mg into the skin See admin instructions. Every 10 days Notes to patient: Do not resume until after follow up.   amLODipine 5 MG tablet Commonly known as: NORVASC Take 5 mg by mouth daily.   celecoxib 200 MG capsule Commonly known as: CELEBREX Take 1 capsule (200 mg total) by mouth every 12 (twelve) hours.   folic acid 1 MG tablet Commonly known as: FOLVITE Take 1 mg by mouth daily.    methocarbamol 500 MG tablet Commonly known as: ROBAXIN Take 1 tablet (500 mg total) by mouth every 6 (six) hours as needed for muscle spasms.   Methotrexate (PF) 10 MG/0.2ML Soaj Inject 10 mg into the skin every Monday.   nitrofurantoin (macrocrystal-monohydrate) 100 MG capsule Commonly known as: MACROBID Take 1 capsule (100 mg total) by mouth 2 (two) times daily for 5 days. Increase WATER intake while taking this medication.   oxyCODONE-acetaminophen 5-325 MG tablet Commonly known as: PERCOCET/ROXICET Take 1 tablet by mouth every 4 (four) hours as needed for up to 7 days. What changed: reasons to take this        Follow-up Information     Thursday, [place] Follow up in 2 week(s).   Why: Already schedule with Danielle in Neurosurgery Contact information: [address] [facility] [place] [phone]                Please note that the patient was diagnosed with a urinary tract infection in preop testing, so her infection was present at the time of surgery.  [phone] 10/19/2020, 7:51 AM

## 2020-10-19 NOTE — Progress Notes (Signed)
    Attending Progress Note  History: Jillian Ward is here for cervical radiculopathy and anterolisthesis.  She is doing well on POD1. Her R arm feels better  Physical Exam: Vitals:   10/19/20 0046 10/19/20 0500  BP: (!) 139/104 (!) 133/94  Pulse: 79 78  Resp: 16 16  Temp: 97.6 F (36.4 C) 97.9 F (36.6 C)  SpO2: 100% 99%    AA Ox3 CNI  Strength:5/5 throughout BUE Incision c/d/i  Data:  Recent Labs  Lab 10/17/20 1118  NA 136  K 3.7  CL 102  CO2 25  BUN 17  CREATININE 0.87  GLUCOSE 92  CALCIUM 9.5   No results for input(s): AST, ALT, ALKPHOS in the last 168 hours.  Invalid input(s): TBILI   Recent Labs  Lab 10/17/20 1118  WBC 9.4  HGB 14.7  HCT 42.1  PLT 255   No results for input(s): APTT, INR in the last 168 hours.       Other tests/results: none  Assessment/Plan:  Jillian Ward is doing well  - mobilize - pain control - DVT prophylaxis   Venetia Night MD, [facility] Department of Neurosurgery

## 2020-10-19 NOTE — Progress Notes (Signed)
Pt will be discharging home. Husband at bedside. Discharge education completed.

## 2020-10-20 LAB — URINE CULTURE: Culture: >=100000 — AB

## 2020-11-10 ENCOUNTER — Encounter: Payer: Self-pay | Admitting: Intensive Care

## 2020-11-10 ENCOUNTER — Emergency Department
Admission: EM | Admit: 2020-11-10 | Discharge: 2020-11-10 | Disposition: A | Discharge status: Home / Self Care | Attending: Student in an Organized Health Care Education/Training Program | Admitting: Student in an Organized Health Care Education/Training Program

## 2020-11-10 ENCOUNTER — Emergency Department

## 2020-11-10 ENCOUNTER — Other Ambulatory Visit: Payer: Self-pay

## 2020-11-10 DIAGNOSIS — R197 Diarrhea, unspecified: Secondary | ICD-10-CM | POA: Insufficient documentation

## 2020-11-10 DIAGNOSIS — R112 Nausea with vomiting, unspecified: Secondary | ICD-10-CM | POA: Insufficient documentation

## 2020-11-10 DIAGNOSIS — F17210 Nicotine dependence, cigarettes, uncomplicated: Secondary | ICD-10-CM | POA: Diagnosis not present

## 2020-11-10 DIAGNOSIS — I10 Essential (primary) hypertension: Secondary | ICD-10-CM | POA: Diagnosis not present

## 2020-11-10 DIAGNOSIS — R1084 Generalized abdominal pain: Secondary | ICD-10-CM | POA: Insufficient documentation

## 2020-11-10 DIAGNOSIS — Z79899 Other long term (current) drug therapy: Secondary | ICD-10-CM | POA: Diagnosis not present

## 2020-11-10 LAB — URINALYSIS, COMPLETE (UACMP) WITH MICROSCOPIC
Bacteria, UA: NONE SEEN
Bilirubin Urine: NEGATIVE
Glucose, UA: NEGATIVE mg/dL
Ketones, ur: 20 mg/dL — AB
Leukocytes,Ua: NEGATIVE
Nitrite: NEGATIVE
Protein, ur: NEGATIVE mg/dL
Specific Gravity, Urine: 1.015 (ref 1.005–1.030)
pH: 6 (ref 5.0–8.0)

## 2020-11-10 LAB — CBC
HCT: 42.7 % (ref 36.0–46.0)
Hemoglobin: 15.1 g/dL — ABNORMAL HIGH (ref 12.0–15.0)
MCH: 30.6 pg (ref 26.0–34.0)
MCHC: 35.4 g/dL (ref 30.0–36.0)
MCV: 86.6 fL (ref 80.0–100.0)
Platelets: 350 10*3/uL (ref 150–400)
RBC: 4.93 MIL/uL (ref 3.87–5.11)
RDW: 12 % (ref 11.5–15.5)
WBC: 15.4 10*3/uL — ABNORMAL HIGH (ref 4.0–10.5)
nRBC: 0 % (ref 0.0–0.2)

## 2020-11-10 LAB — COMPREHENSIVE METABOLIC PANEL
ALT: 13 U/L (ref 0–44)
AST: 18 U/L (ref 15–41)
Albumin: 4.4 g/dL (ref 3.5–5.0)
Alkaline Phosphatase: 62 U/L (ref 38–126)
Anion gap: 13 (ref 5–15)
BUN: 16 mg/dL (ref 6–20)
CO2: 23 mmol/L (ref 22–32)
Calcium: 9.6 mg/dL (ref 8.9–10.3)
Chloride: 98 mmol/L (ref 98–111)
Creatinine, Ser: 0.71 mg/dL (ref 0.44–1.00)
GFR, Estimated: >60 mL/min (ref >60)
Glucose, Bld: 111 mg/dL — ABNORMAL HIGH (ref 70–99)
Potassium: 3.9 mmol/L (ref 3.5–5.1)
Sodium: 134 mmol/L — ABNORMAL LOW (ref 135–145)
Total Bilirubin: 1.1 mg/dL (ref 0.3–1.2)
Total Protein: 8 g/dL (ref 6.5–8.1)

## 2020-11-10 LAB — LIPASE, BLOOD: Lipase: 36 U/L (ref 11–51)

## 2020-11-10 IMAGING — CT CT ABD-PELV W/O CM
2 of 4 series · 16 of 46 positions shown, 18 images · non-contrast
Comparison: [DATE]

CLINICAL DATA: Abdominal abscess or infection suspected. Nausea and
vomiting. UTI.

EXAM:
CT ABDOMEN AND PELVIS WITHOUT CONTRAST
TECHNIQUE: Multidetector CT imaging of the abdomen and pelvis was performed
following the standard protocol without IV contrast.

[Series 2: axial st · axial · 0.75mm/px · z∈[-852,-412]mm · 13 of 98 slices shown, 15 images]
[im 5/98  soft-tissue]
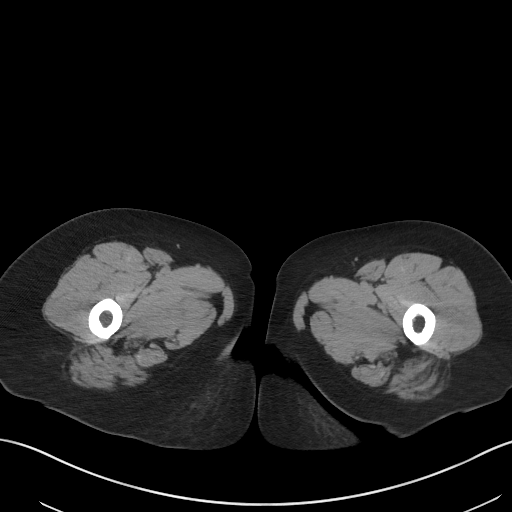
[im 5/98  bone]
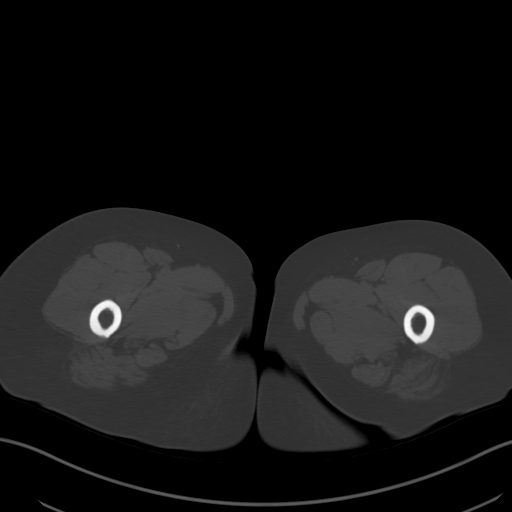
[im 13/98  soft-tissue]
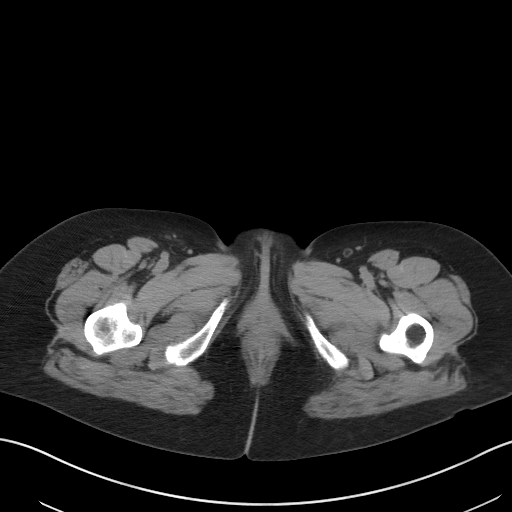
[im 21/98  soft-tissue]
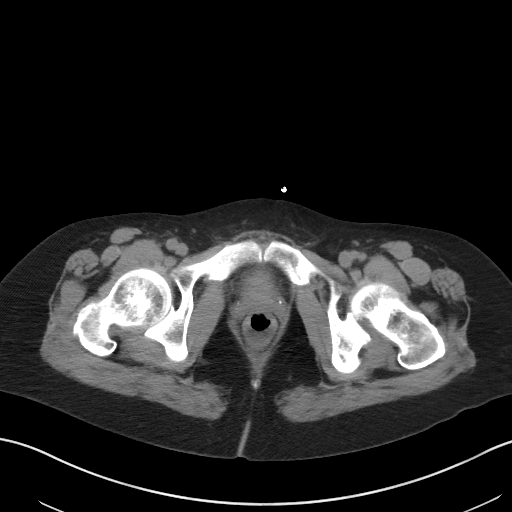
[im 29/98  soft-tissue]
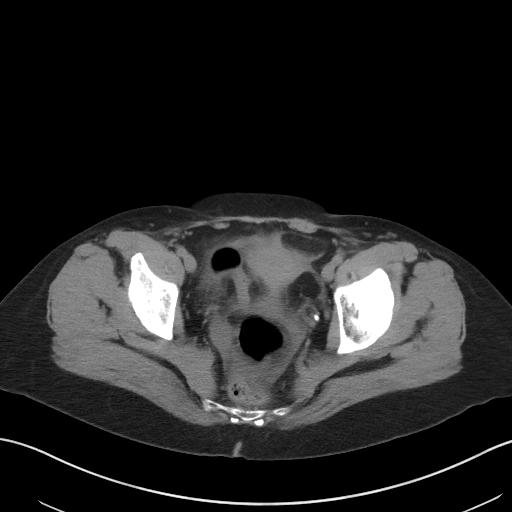
[im 33/98  soft-tissue]
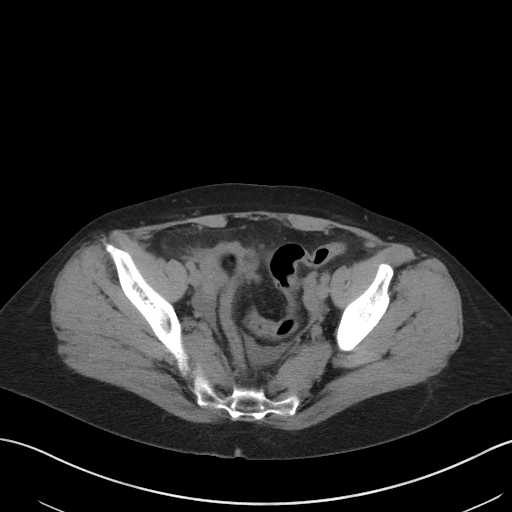
[im 41/98  soft-tissue]
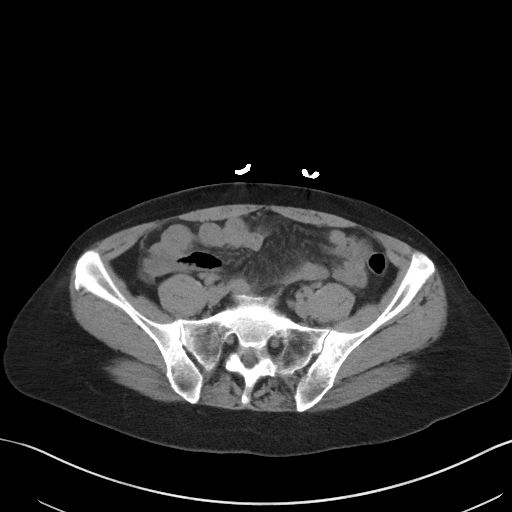
[im 49/98  soft-tissue]
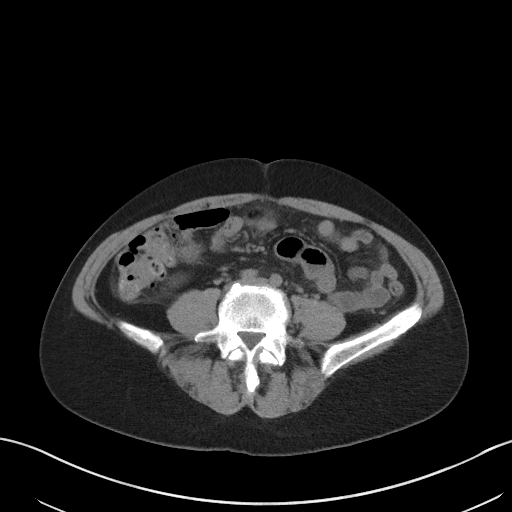
[im 57/98  soft-tissue]
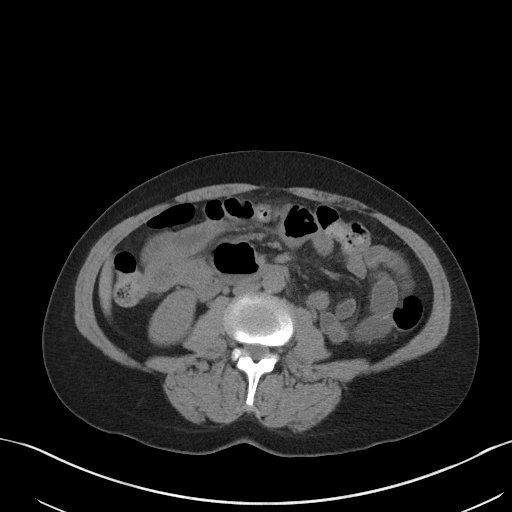
[im 65/98  soft-tissue]
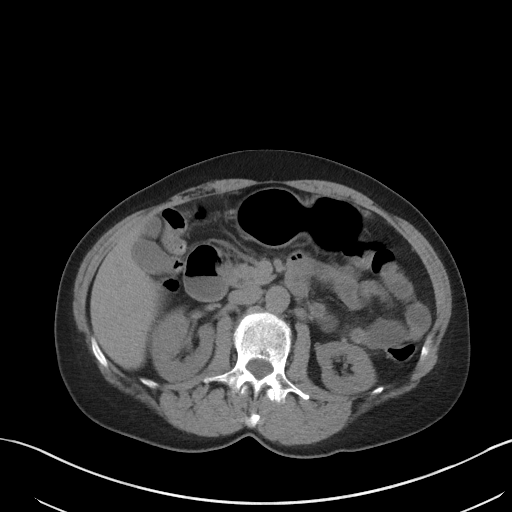
[im 65/98  bone]
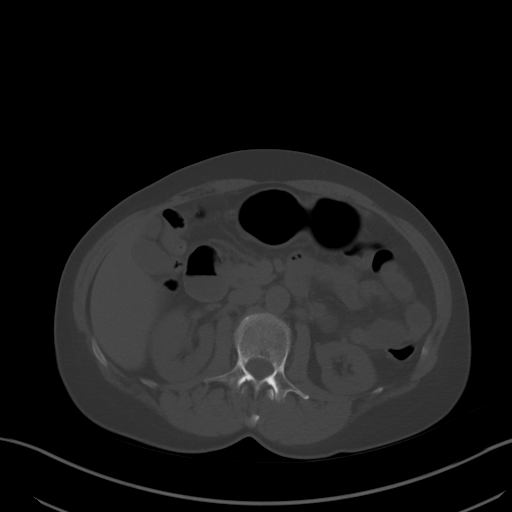
[im 69/98  soft-tissue]
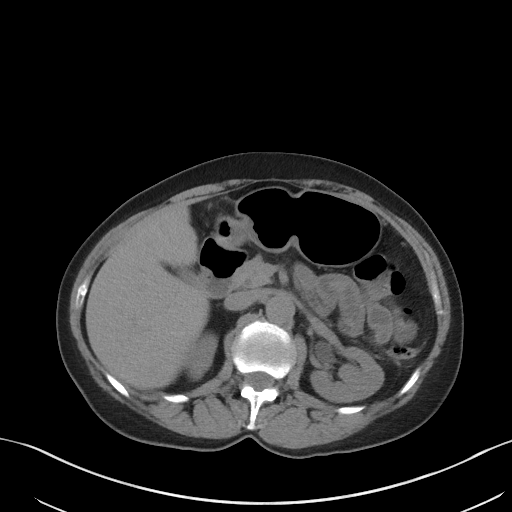
[im 77/98  soft-tissue]
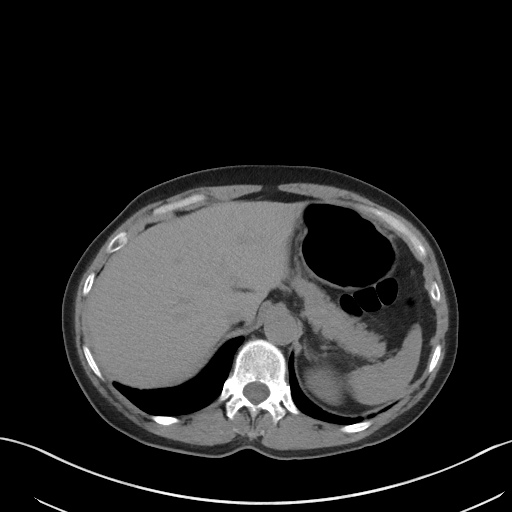
[im 85/98  soft-tissue]
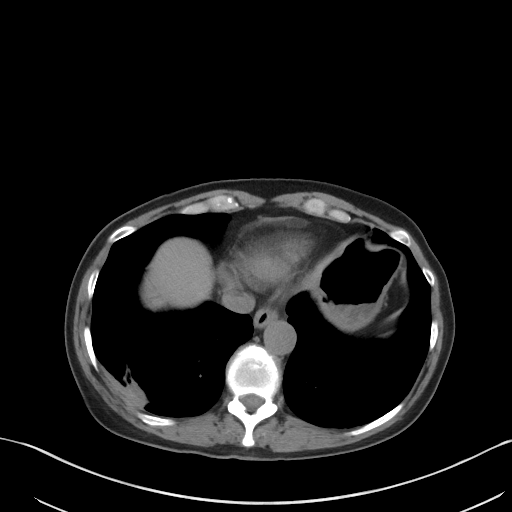
[im 93/98  soft-tissue]
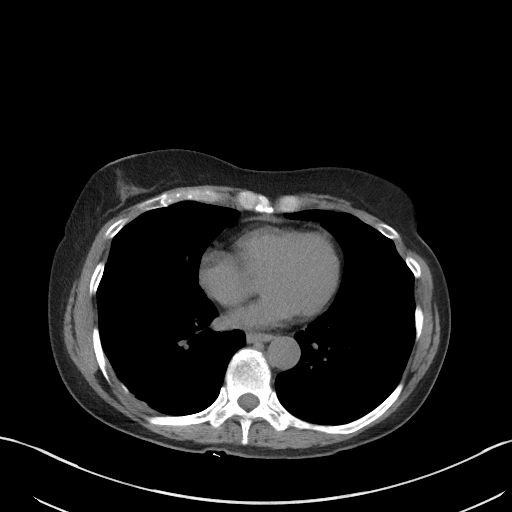

[Series 5: coronal st · coronal · 0.62mm/px · 3 of 101 slices shown]
[im 34/101  soft-tissue]
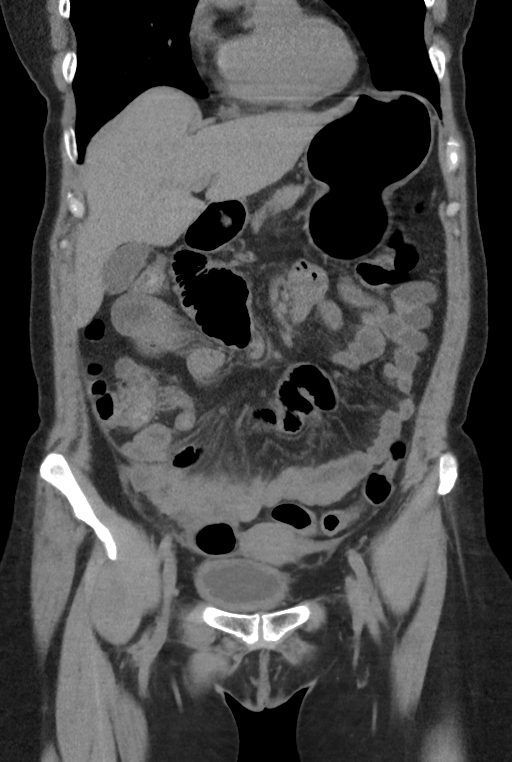
[im 45/101  soft-tissue]
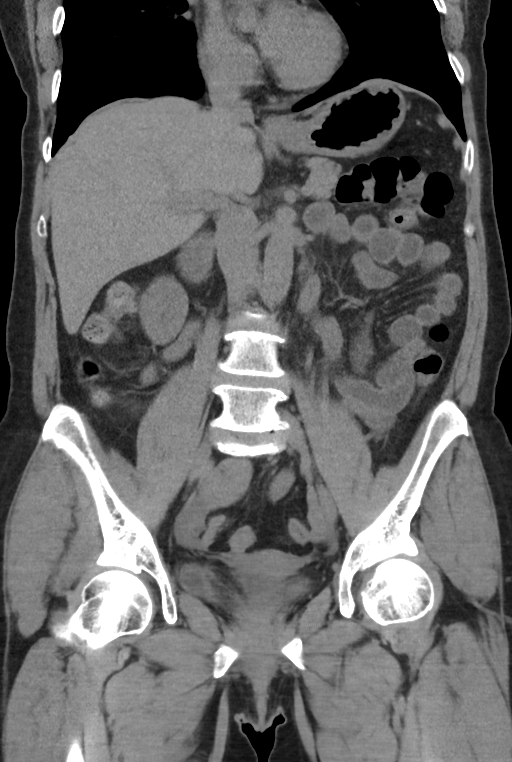
[im 56/101  soft-tissue]
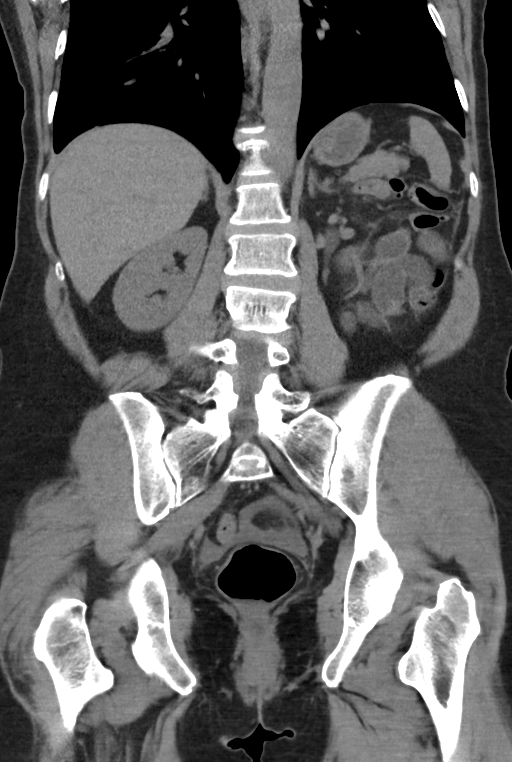

[16 of 46 positions shown; findings below may reference images not displayed]

FINDINGS: Lower chest: Subpleural scarring in the right lower lobe, stable.
Stable fatty focus at the left ventricular apex.

Hepatobiliary: No focal liver abnormality.No evidence of biliary
obstruction or stone.

Pancreas: Unremarkable.

Spleen: Unremarkable.

Adrenals/Urinary Tract: Negative adrenals. No hydronephrosis or
stone. Haziness of fat around the bladder which is prominent in
thickness although under distended.

Stomach/Bowel: Hazy mesenteric fat around multiple small bowel loops
without definite thickening. There is a chart history of
appendectomy. No bowel obstruction

Vascular/Lymphatic: No acute vascular abnormality. No mass or
adenopathy.

Reproductive:No pathologic findings.

Other: Trace peritoneal fluid which is considered reactive in this
setting.

Musculoskeletal: No acute abnormalities. L5-S1 disc narrowing and
spurring with left foraminal impingement.
IMPRESSION: 1. Prominent bladder wall thickness with perivesicular stranding,
correlating with UTI. No indication of upper tract infection.
2. Generalized mesenteric fat haziness and trace peritoneal fluid,
suspect enteritis.

## 2020-11-10 MED ORDER — ONDANSETRON 4 MG PO TBDP
4.0000 mg | ORAL_TABLET | Freq: Once | ORAL | Status: AC | PRN
  Administered 2020-11-10: 4 mg via ORAL
  Filled 2020-11-10: qty 1

## 2020-11-10 MED ORDER — SODIUM CHLORIDE 0.9 % IV BOLUS
1000.0000 mL | Freq: Once | INTRAVENOUS | Status: AC
  Administered 2020-11-10: 1000 mL via INTRAVENOUS

## 2020-11-10 MED ORDER — METOCLOPRAMIDE HCL 10 MG PO TABS: 10.0000 mg | ORAL_TABLET | Freq: Four times a day (QID) | ORAL | 0 refills | Status: DC | PRN

## 2020-11-10 MED ORDER — PHENAZOPYRIDINE HCL 100 MG PO TABS: 100.0000 mg | ORAL_TABLET | Freq: Three times a day (TID) | ORAL | 0 refills | Status: DC | PRN

## 2020-11-10 MED ORDER — MORPHINE SULFATE (PF) 4 MG/ML IV SOLN
4.0000 mg | INTRAVENOUS | Status: DC | PRN
  Administered 2020-11-10: 4 mg via INTRAVENOUS
  Filled 2020-11-10: qty 1

## 2020-11-10 MED ORDER — OXYCODONE-ACETAMINOPHEN 5-325 MG PO TABS
1.0000 | ORAL_TABLET | Freq: Once | ORAL | Status: AC
  Administered 2020-11-10: 1 via ORAL
  Filled 2020-11-10: qty 1

## 2020-11-10 NOTE — ED Provider Notes (Signed)
[facility] Emergency Department Provider Note    Event Date/Time   First MD Initiated Contact with Patient 11/10/20 [phone]     (approximate)  I have reviewed the triage vital signs and the nursing notes.   HISTORY  Chief Complaint Abdominal Pain and Emesis    HPI Jillian Ward is a 55 y.o. female with below listed past medical history presents to the ER for evaluation of generalized burning abdominal pain associate with nausea vomiting and episode of nonbloody melanotic diarrhea last night.  States she been having pain like this for several weeks going back to before when she had cervical surgery here this facility.  Was told it was a UTI was put on antibiotics without much improvement.  States that she is moving her bowels normally.  Has had appendectomy and has had 2 C-sections.  Still passing gas.  Past Medical History:  Diagnosis Date   Anxiety    Arthritis    rheumatoid   Depression    Dyspnea    Hypertension    Nodule of right lung    Rib fracture    History reviewed. No pertinent family history. 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 Authorizing Provider  phenazopyridine (PYRIDIUM) 100 MG tablet Take 1 tablet (100 mg total) by mouth 3 (three) times daily as needed for pain. 11/10/20  Yes Willy Eddy, MD  acetaminophen (TYLENOL) 325 MG tablet Take 2 tablets (650 mg total) by mouth every 4 (four) hours as needed for mild pain ((score 1 to 3) or temp > 100.5). 10/19/20   Venetia Night, MD  Adalimumab 40 MG/0.8ML PSKT Inject 40 mg into the skin See admin instructions. Every 10 days    [provider]  amLODipine (NORVASC) 5 MG tablet Take 5 mg by mouth daily.  10/02/19   [provider]  celecoxib (CELEBREX) 200 MG capsule Take 1 capsule (200 mg total) by mouth every 12 (twelve) hours. 10/19/20   Venetia Night, MD  folic acid (FOLVITE) 1 MG tablet Take 1 mg by mouth daily.    [provider]  methocarbamol (ROBAXIN) 500 MG tablet Take 1 tablet (500 mg total) by mouth every 6 (six) hours as needed for muscle spasms. 10/19/20   Venetia Night, MD  Methotrexate, PF, 10 MG/0.2ML SOAJ Inject 10 mg into the skin every Monday.    [provider]  metoCLOPramide (REGLAN) 10 MG tablet Take 1 tablet (10 mg total) by mouth every 6 (six) hours as needed for nausea. 11/10/20 11/10/21  Willy Eddy, MD    Allergies Penicillins and Iodinated diagnostic agents    Social History Social History   Tobacco Use   Smoking status: Heavy Smoker    Packs/day: 1.00    Years: 30.00    Pack years: 30.00    Types: Cigarettes   Smokeless tobacco: Never  Substance Use Topics   Alcohol use: No    Comment: socially    Drug use: No    Review of Systems Patient denies headaches, rhinorrhea, blurry vision, numbness, shortness of breath, chest pain, edema, cough, abdominal pain, nausea, vomiting, diarrhea, dysuria, fevers, rashes or hallucinations unless  otherwise stated above in HPI. ____________________________________________   PHYSICAL EXAM:  VITAL SIGNS: Vitals:   11/10/20 1000 11/10/20 1030  BP: (!) 130/101 125/90  Pulse: (!) 102 90  Resp: 16   Temp:    SpO2: 98% 98%    Constitutional: Alert and oriented.  Eyes: Conjunctivae are normal.  Head: Atraumatic. Nose: No congestion/rhinnorhea. Mouth/Throat: Mucous membranes are moist.   Neck: No stridor. Painless ROM.  Cardiovascular: Normal rate, regular rhythm. Grossly normal heart sounds.  Good peripheral circulation. Respiratory: Normal respiratory effort.  No retractions. Lungs CTAB. Gastrointestinal: Soft and nontender. No distention. No abdominal bruits. No CVA tenderness. Genitourinary:  Musculoskeletal: No lower extremity tenderness nor edema.  No joint effusions. Neurologic:  Normal speech and language. No gross focal neurologic deficits are appreciated. No facial droop Skin:  Skin is warm, dry and intact. No rash noted. Psychiatric: Mood and affect are normal. Speech and behavior are normal.  ____________________________________________   LABS (all labs ordered are listed, but only abnormal results are displayed)  Results for orders placed or performed during the hospital encounter of 11/10/20 (from the past 24 hour(s))  Lipase, blood     Status: None   Collection Time: 11/10/20  8:33 AM  Result Value Ref Range   Lipase 36 11 - 51 U/L  Comprehensive metabolic panel     Status: Abnormal   Collection Time: 11/10/20  8:33 AM  Result Value Ref Range   Sodium 134 (L) 135 - 145 mmol/L   Potassium 3.9 3.5 - 5.1 mmol/L   Chloride 98 98 - 111 mmol/L   CO2 23 22 - 32 mmol/L   Glucose, Bld 111 (H) 70 - 99 mg/dL   BUN 16 6 - 20 mg/dL   Creatinine, Ser 1.61 0.44 - 1.00 mg/dL   Calcium 9.6 8.9 - 09.6 mg/dL   Total Protein 8.0 6.5 - 8.1 g/dL   Albumin 4.4 3.5 - 5.0 g/dL   AST 18 15 - 41 U/L   ALT 13 0 - 44 U/L   Alkaline Phosphatase 62 38 - 126 U/L   Total  Bilirubin 1.1 0.3 - 1.2 mg/dL   GFR, Estimated >04 >54 mL/min   Anion gap 13 5 - 15  CBC     Status: Abnormal   Collection Time: 11/10/20  8:33 AM  Result Value Ref Range   WBC 15.4 (H) 4.0 - 10.5 K/uL   RBC 4.93 3.87 - 5.11 MIL/uL   Hemoglobin 15.1 (H) 12.0 - 15.0 g/dL   HCT 09.8 11.9 - 14.7 %   MCV 86.6 80.0 - 100.0 fL   MCH 30.6 26.0 - 34.0 pg   MCHC 35.4 30.0 - 36.0 g/dL   RDW 82.9 56.2 - 13.0 %   Platelets 350 150 - 400 K/uL   nRBC 0.0 0.0 - 0.2 %  Urinalysis, Complete w Microscopic     Status:  Abnormal   Collection Time: 11/10/20  8:42 AM  Result Value Ref Range   Color, Urine YELLOW (A) YELLOW   APPearance HAZY (A) CLEAR   Specific Gravity, Urine 1.015 1.005 - 1.030   pH 6.0 5.0 - 8.0   Glucose, UA NEGATIVE NEGATIVE mg/dL   Hgb urine dipstick SMALL (A) NEGATIVE   Bilirubin Urine NEGATIVE NEGATIVE   Ketones, ur 20 (A) NEGATIVE mg/dL   Protein, ur NEGATIVE NEGATIVE mg/dL   Nitrite NEGATIVE NEGATIVE   Leukocytes,Ua NEGATIVE NEGATIVE   RBC / HPF 0-5 0 - 5 RBC/hpf   WBC, UA 0-5 0 - 5 WBC/hpf   Bacteria, UA NONE SEEN NONE SEEN   Squamous Epithelial / LPF 0-5 0 - 5   Mucus PRESENT    Hyaline Casts, UA PRESENT    ____________________________________________ ____________________________________________  RADIOLOGY  I personally reviewed all radiographic images ordered to evaluate for the above acute complaints and reviewed radiology reports and findings.  These findings were personally discussed with the patient.  Please see medical record for radiology report.  ____________________________________________   PROCEDURES  Procedure(s) performed:  Procedures    Critical Care performed: no ____________________________________________   INITIAL IMPRESSION / ASSESSMENT AND PLAN / ED COURSE  Pertinent labs & imaging results that were available during my care of the patient were reviewed by me and considered in my medical decision making (see chart for details).    DDX: Enteritis, colitis, perforation, SBO, mass, UTI, stone, constipation  Jaden Batchelder Koenig is a 55 y.o. who presents to the ED with symptoms as described above..  Nontoxic-appearing afebrile hemodynamically stable.  Given duration of pain location CT imaging ordered for the above differential.  CT imaging shows possible enteritis as well as possible cystitis but the urine is clear was recently treated for UTI.  Have a lower suspicion for urinary tract infection.  Possible enteritis.  Patient also may be having effects of what appear to be chronic narcotic medication and recent anesthesia.  She is not having any diarrhea here have a lower suspicion for C. difficile or infectious colitis.  Does appear stable and appropriate for outpatient follow-up     The patient was evaluated in Emergency Department today for the symptoms described in the history of present illness. He/she was evaluated in the context of the global COVID-19 pandemic, which necessitated consideration that the patient might be at risk for infection with the SARS-CoV-2 virus that causes COVID-19. Institutional protocols and algorithms that pertain to the evaluation of patients at risk for COVID-19 are in a state of rapid change based on information released by regulatory bodies including the CDC and federal and state organizations. These policies and algorithms were followed during the patient's care in the ED.  As part of my medical decision making, I reviewed the following data within the electronic MEDICAL RECORD NUMBER Nursing notes reviewed and incorporated, Labs reviewed, notes from prior ED visits and [place] Controlled Substance Database   ____________________________________________   FINAL CLINICAL IMPRESSION(S) / ED DIAGNOSES  Final diagnoses:  Nausea vomiting and diarrhea      NEW MEDICATIONS STARTED DURING THIS VISIT:  New Prescriptions   METOCLOPRAMIDE (REGLAN) 10 MG TABLET    Take 1 tablet (10 mg total) by mouth  every 6 (six) hours as needed for nausea.   PHENAZOPYRIDINE (PYRIDIUM) 100 MG TABLET    Take 1 tablet (100 mg total) by mouth 3 (three) times daily as needed for pain.     Note:  This document was  prepared using Conservation officer, historic buildings and may include unintentional dictation errors.    Willy Eddy, MD 11/10/20 [phone]

## 2020-11-10 NOTE — ED Triage Notes (Addendum)
First Nurse Note: Arrives from [facility] for ED evaluation of abdominal pain and vomiting intermittently x 4 months, symptoms worsened yesterday.  Patient is AAOx3.  Skin warm and dry. NAD

## 2020-11-10 NOTE — ED Notes (Addendum)
Pt advised she thinks she has a UTI that has gone untreated. She was seen 7/28 for same symptoms as today and was diagnosed with UTI and treated accordingly. But has had similar symptoms for the last few months. She has never followed up with surgeon for abd pain and nausea post-op.

## 2020-11-10 NOTE — ED Notes (Signed)
Pt ambulated to toilet. Then back to bed. Pt given crackers and water for PO challenge.

## 2020-11-10 NOTE — ED Notes (Signed)
Pt ambulated to toilet to attempt to produce a specimen for her GI testing. She will call RN when ready for collection.

## 2020-11-10 NOTE — Discharge Instructions (Addendum)

## 2020-11-10 NOTE — ED Notes (Signed)
Pt was unable to produce a BM for collection.

## 2021-01-30 ENCOUNTER — Other Ambulatory Visit: Payer: Self-pay | Admitting: Neurosurgery

## 2021-01-30 ENCOUNTER — Other Ambulatory Visit (HOSPITAL_BASED_OUTPATIENT_CLINIC_OR_DEPARTMENT_OTHER): Payer: Self-pay | Admitting: Neurosurgery

## 2021-01-30 DIAGNOSIS — Z981 Arthrodesis status: Secondary | ICD-10-CM

## 2021-01-30 DIAGNOSIS — G8929 Other chronic pain: Secondary | ICD-10-CM

## 2021-01-30 DIAGNOSIS — M25512 Pain in left shoulder: Secondary | ICD-10-CM

## 2021-01-30 DIAGNOSIS — M5412 Radiculopathy, cervical region: Secondary | ICD-10-CM

## 2021-02-14 ENCOUNTER — Other Ambulatory Visit: Payer: Self-pay

## 2021-02-14 ENCOUNTER — Ambulatory Visit
Admission: RE | Admit: 2021-02-14 | Discharge: 2021-02-14 | Disposition: A | Discharge status: Home / Self Care | Source: Ambulatory Visit | Attending: Neurosurgery | Admitting: Neurosurgery

## 2021-02-14 DIAGNOSIS — M25512 Pain in left shoulder: Secondary | ICD-10-CM | POA: Diagnosis not present

## 2021-02-14 DIAGNOSIS — M5117 Intervertebral disc disorders with radiculopathy, lumbosacral region: Secondary | ICD-10-CM | POA: Diagnosis not present

## 2021-02-14 DIAGNOSIS — M4727 Other spondylosis with radiculopathy, lumbosacral region: Secondary | ICD-10-CM | POA: Diagnosis not present

## 2021-02-14 DIAGNOSIS — Z981 Arthrodesis status: Secondary | ICD-10-CM | POA: Diagnosis not present

## 2021-02-14 DIAGNOSIS — M5442 Lumbago with sciatica, left side: Secondary | ICD-10-CM

## 2021-02-14 DIAGNOSIS — M50223 Other cervical disc displacement at C6-C7 level: Secondary | ICD-10-CM | POA: Diagnosis not present

## 2021-02-14 DIAGNOSIS — M47812 Spondylosis without myelopathy or radiculopathy, cervical region: Secondary | ICD-10-CM | POA: Insufficient documentation

## 2021-02-14 DIAGNOSIS — M5412 Radiculopathy, cervical region: Secondary | ICD-10-CM

## 2021-02-14 DIAGNOSIS — M4802 Spinal stenosis, cervical region: Secondary | ICD-10-CM | POA: Diagnosis not present

## 2021-02-14 DIAGNOSIS — M5126 Other intervertebral disc displacement, lumbar region: Secondary | ICD-10-CM | POA: Diagnosis not present

## 2021-02-14 DIAGNOSIS — G8929 Other chronic pain: Secondary | ICD-10-CM | POA: Insufficient documentation

## 2021-02-14 IMAGING — RF DG FLUORO GUIDE NDL PLC/BX
1 series · 1 of 1 positions shown · non-contrast
Comparison: none

CLINICAL DATA: Patient complains of left shoulder pain with
radiation down the arm and limited range of motion after a recent
spinal surgery. Patient denies any prior surgeries to the left
shoulder and denies any specific injury to the left shoulder.
Patient is allergic to iodinated contrast and not premedicated
therefore no iodinated contrast was used.

EXAM:
EXAM
LEFT SHOULDER INJECTION UNDER FLUOROSCOPY FOR MRI
FLUOROSCOPY TIME:  12 seconds, 1 image.
TECHNIQUE: Risks and benefits of the procedure were discussed with the patient,
all questions were answered and informed written consent was
obtained.

[Series 1: cp_standard · 0.20mm/px · 1 of 1 slices shown]
[im 1/1]
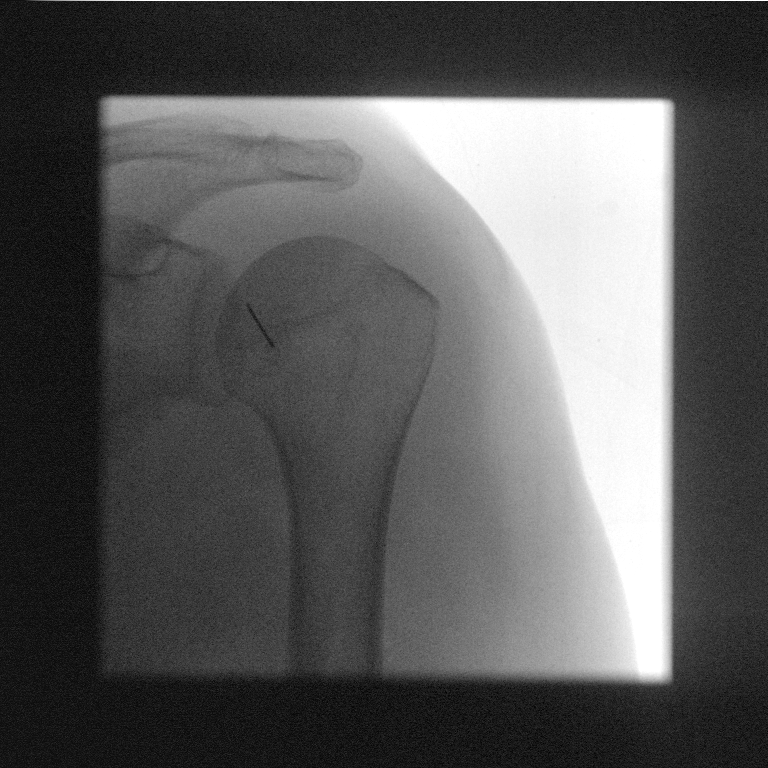

[1 of 1 positions shown; findings below may reference images not displayed]

An appropriate skin entry site was determined under fluoroscopy.
Skin site was marked, prepped with chlorhexidine, and draped in
usual sterile fashion, and infiltrated locally with 1% lidocaine.

22-gauge needle advanced to the superior medial margin of the
humeral head. Intra-articular location was confirmed with loss of
resistance technique with 1 mL of lidocaine 1% injected easily. A
mixture of 20 mL of sterile saline with 0.05ml Gadavist contrast was
injected into the glenohumeral joint space. The needle was removed,
no immediate complications. A sterile bandage was applied. Patient
transferred to MRI.

COMPLICATIONS:
COMPLICATIONS
none
IMPRESSION: 1. Technically successful fluoroscopic guided left shoulder
injection for MRI.

## 2021-02-14 IMAGING — MR MR SHOULDER*L* W/ CM
5 series · 40 of 40 positions shown · IV contrast (agent unspecified)
Comparison: None.

CLINICAL DATA: Left shoulder pain

EXAM:
MR ARTHROGRAM OF THE left SHOULDER
TECHNIQUE: Multiplanar, multisequence MR imaging of the left shoulder was
performed following the administration of intra-articular contrast.
CONTRAST:  See Injection Documentation.

[Series 3: T1 fat-sat · axial · left · 4.0mm · 0.55mm/px · z∈[-48,+67]mm · 8 of 24 slices shown (1 of 2)]
[im 1/24]
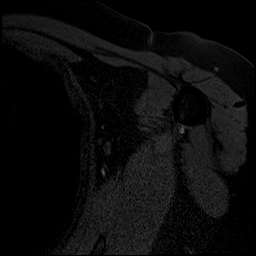
[im 4/24]
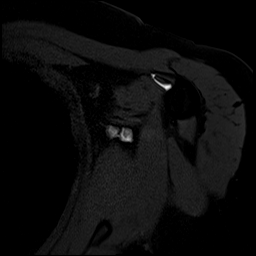
[im 7/24]
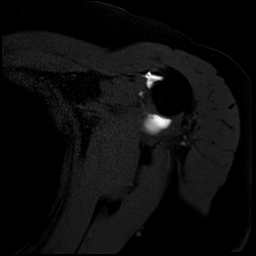
[im 10/24]
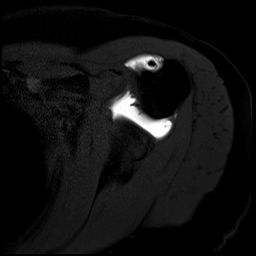
[im 14/24]
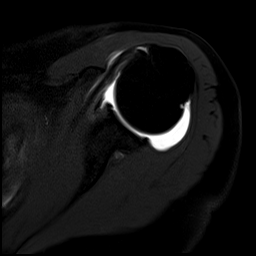
[im 17/24]
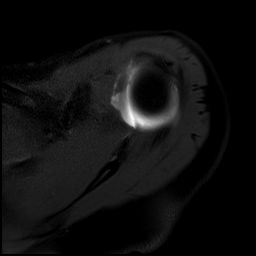
[im 20/24]
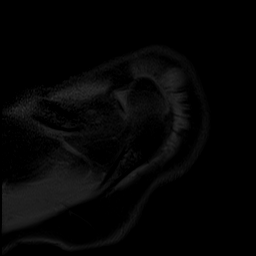
[im 24/24]
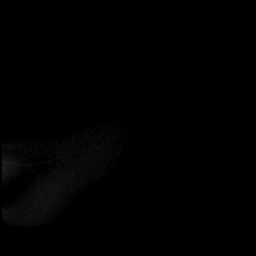

[Series 4: T1 fat-sat · oblique · left · 4.0mm · 0.55mm/px · 8 of 24 slices shown (2 of 2)]
[im 1/24]
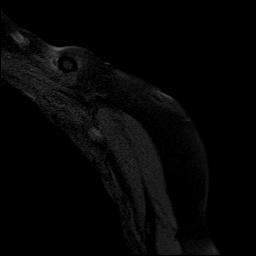
[im 4/24]
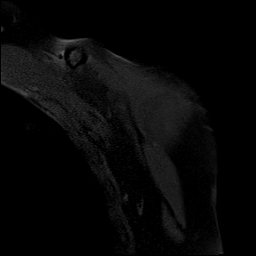
[im 7/24]
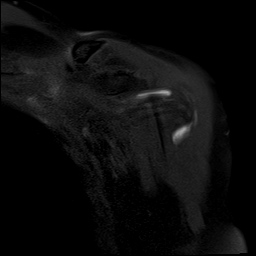
[im 10/24]
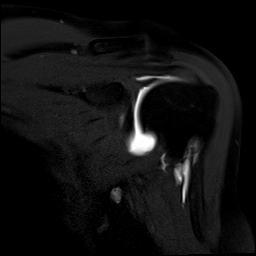
[im 14/24]
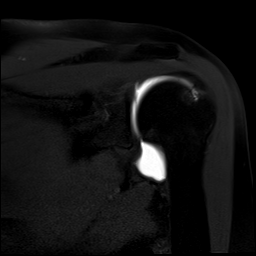
[im 17/24]
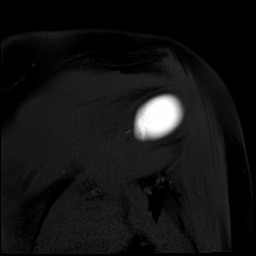
[im 20/24]
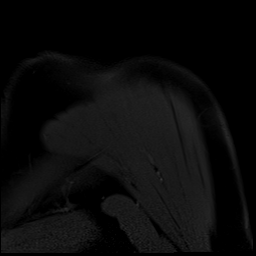
[im 24/24]
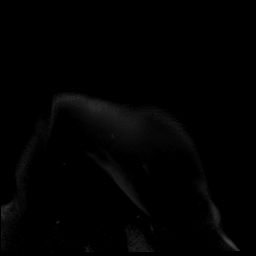

[Series 5: T2 fat-sat · oblique · left · 4.0mm · 0.55mm/px · 8 of 24 slices shown (1 of 2)]
[im 1/24]
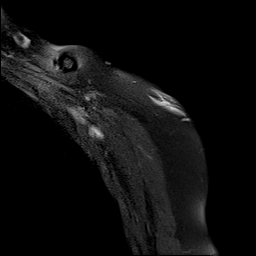
[im 4/24]
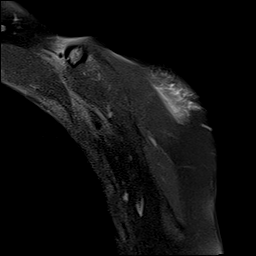
[im 7/24]
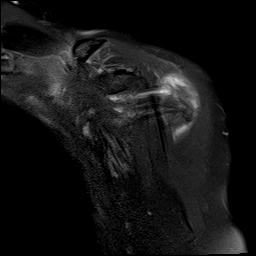
[im 10/24]
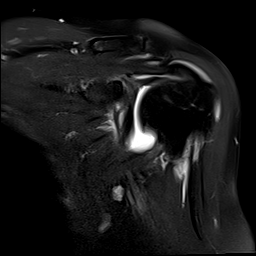
[im 14/24]
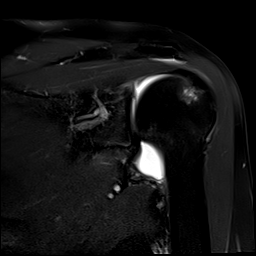
[im 17/24]
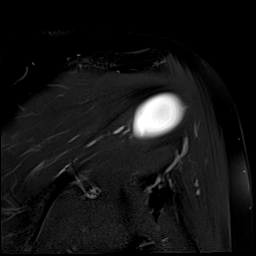
[im 20/24]
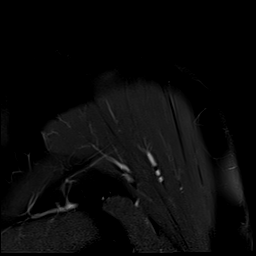
[im 24/24]
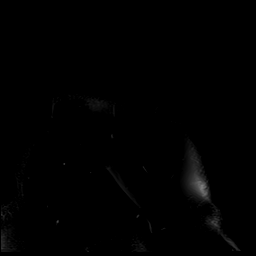

[Series 6: T1 · oblique · left · 4.0mm · 0.51mm/px · 8 of 24 slices shown]
[im 1/24]
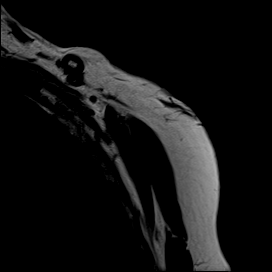
[im 4/24]
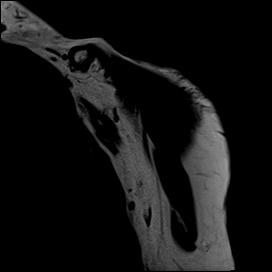
[im 7/24]
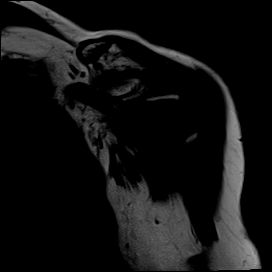
[im 10/24]
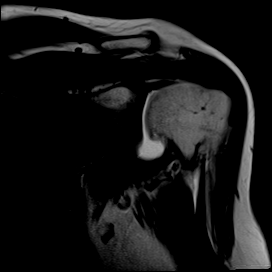
[im 14/24]
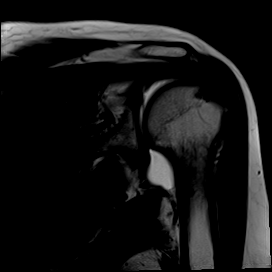
[im 17/24]
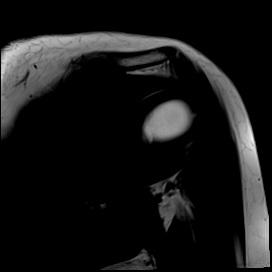
[im 20/24]
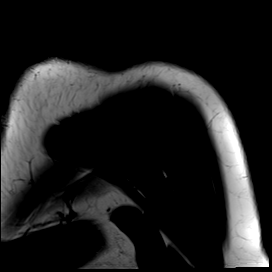
[im 24/24]
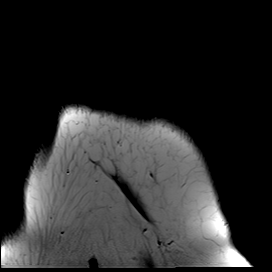

[Series 7: T2 fat-sat · oblique · left · 4.0mm · 0.55mm/px · 8 of 24 slices shown (2 of 2)]
[im 1/24]
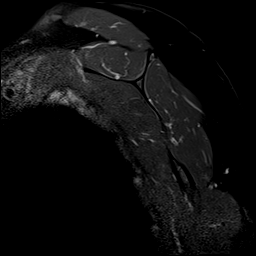
[im 4/24]
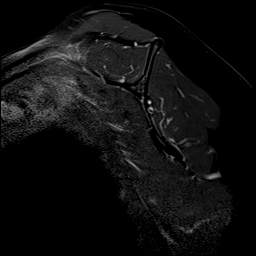
[im 7/24]
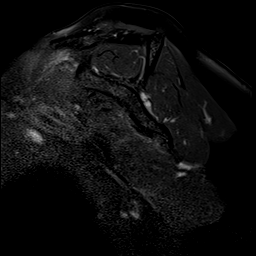
[im 10/24]
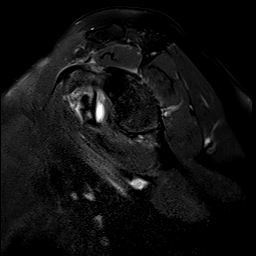
[im 14/24]
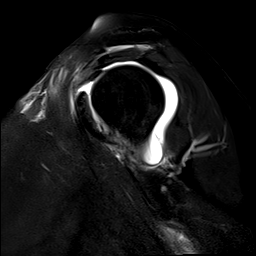
[im 17/24]
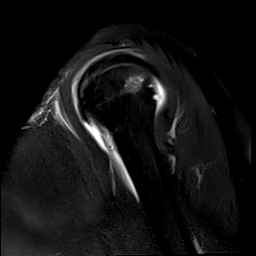
[im 20/24]
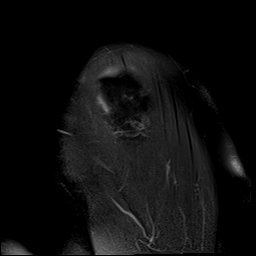
[im 24/24]
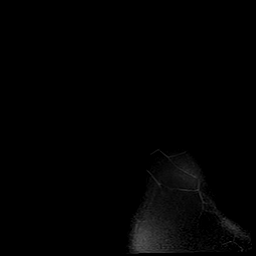

[40 of 40 positions shown; findings below may reference images not displayed]

FINDINGS: Rotator cuff: There is distal supraspinatus and infraspinatus
tendinosis. Infraspinatus tendon is intact. Teres minor tendon is
intact. Subscapularis tendon is intact.

Muscles: No significant muscle atrophy.

Biceps Long Head: Intraarticular and extraarticular portions of the
biceps tendon are intact.

Acromioclavicular Joint: Mild arthropathy of the acromioclavicular
joint. Small amount of subacromial/subdeltoid bursal fluid likely
related to injection technique.

Glenohumeral Joint: Adequatedistension of the glenohumeral joint. No
chondral defect.

Labrum: No displaced labral tear. Mildly increased signal at the
biceps labral anchor and irregularity likely representing mild
labral degeneration.

Bones: No fracture or dislocation. No aggressive osseous lesion.

Other: No fluid collection or hematoma.
IMPRESSION: Distal supraspinatus and infraspinatus tendinosis. No high-grade or
retracted cuff tear. No muscle atrophy.

Mild superior labral degeneration at the biceps labral anchor. No
displaced labral tear.

Mild AC joint arthropathy.

## 2021-02-14 IMAGING — MR MR LUMBAR SPINE W/O CM
5 series · 31 of 48 positions shown · non-contrast
Comparison: None.

CLINICAL DATA: Chronic low back pain and bilateral leg pain

EXAM:
MRI LUMBAR SPINE WITHOUT CONTRAST
TECHNIQUE: Multiplanar, multisequence MR imaging of the lumbar spine was
performed. No intravenous contrast was administered.

[Series 5: T2 · sagittal · 4.0mm · 0.81mm/px · 6 of 17 slices shown (1 of 2)]
[im 1/17]
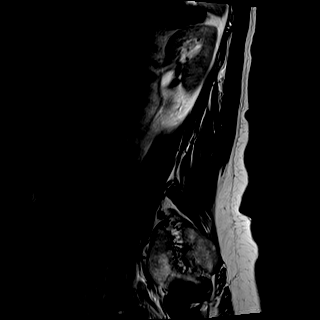
[im 4/17]
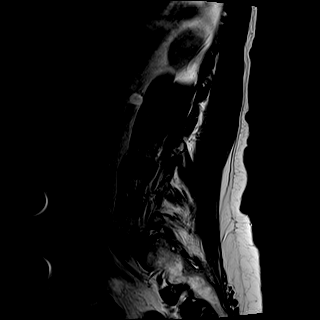
[im 7/17]
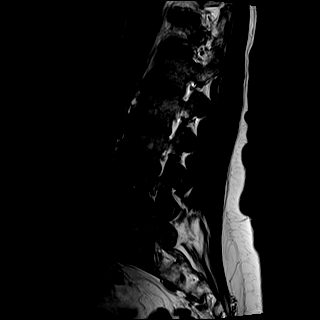
[im 10/17]
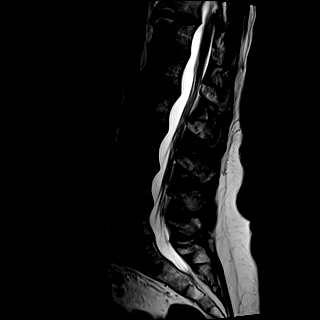
[im 13/17]
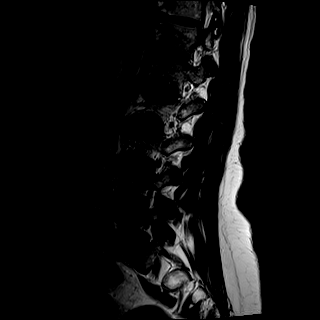
[im 17/17]
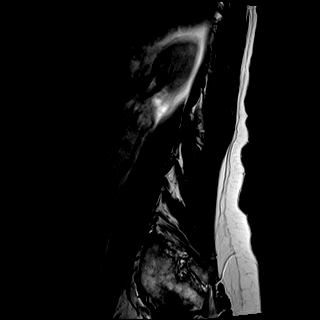

[Series 6: T1 · sagittal · 4.0mm · 0.81mm/px · 7 of 17 slices shown (1 of 2)]
[im 1/17]
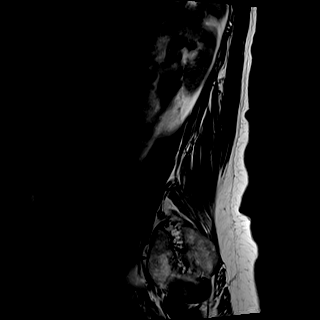
[im 3/17]
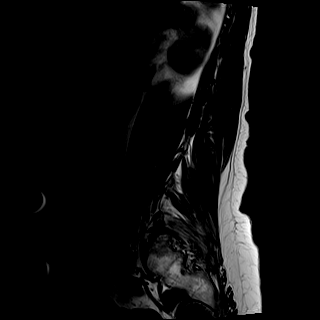
[im 6/17]
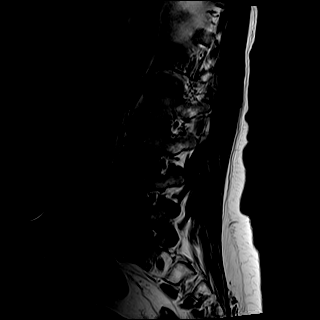
[im 9/17]
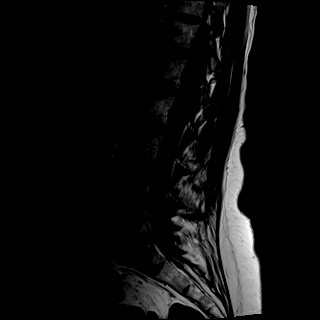
[im 11/17]
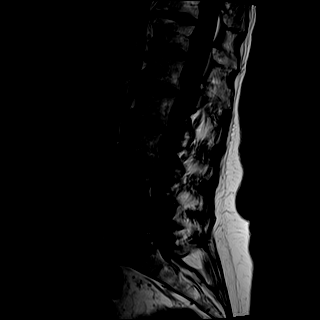
[im 14/17]
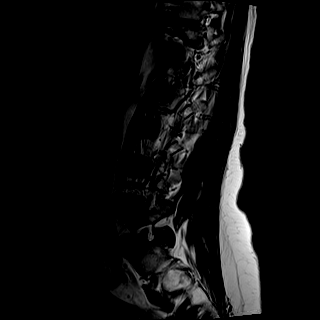
[im 17/17]
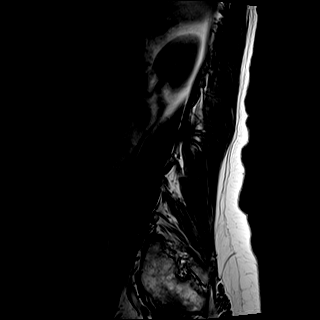

[Series 7: STIR · sagittal · 4.0mm · 0.41mm/px · 2 of 17 slices shown]
[im 1/17]
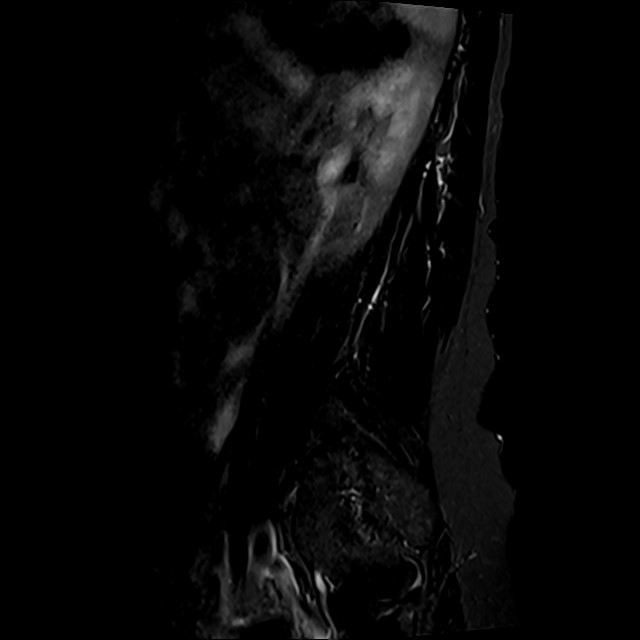
[im 3/17]
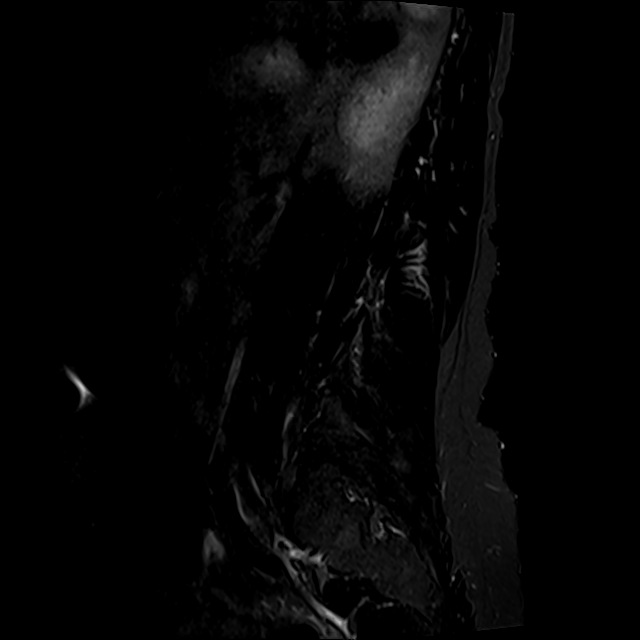

[Series 8: T2 · axial · 4.0mm · 0.78mm/px · z∈[-109,+90]mm · 8 of 36 slices shown (2 of 2)]
[im 1/36]
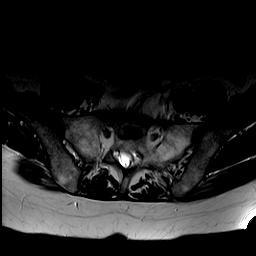
[im 6/36]
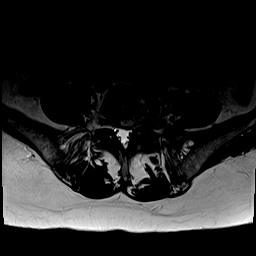
[im 11/36]
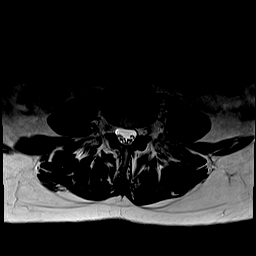
[im 17/36]
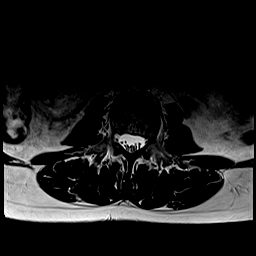
[im 19/36]
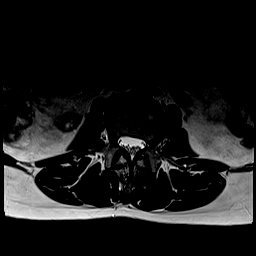
[im 25/36]
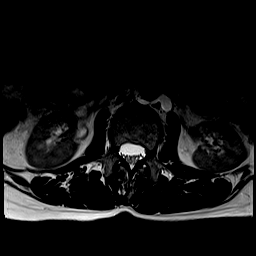
[im 30/36]
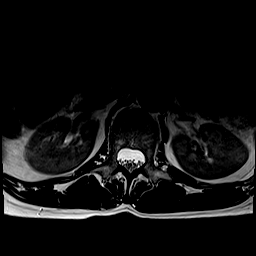
[im 36/36]
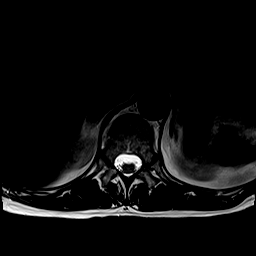

[Series 9: T1 · axial · 4.0mm · 0.39mm/px · z∈[-109,+90]mm · 8 of 36 slices shown (2 of 2)]
[im 1/36]
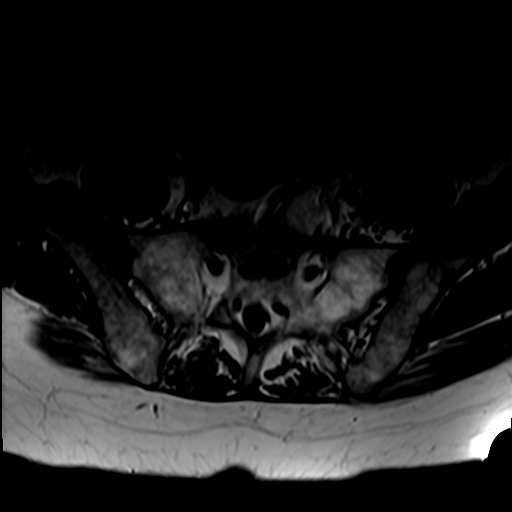
[im 6/36]
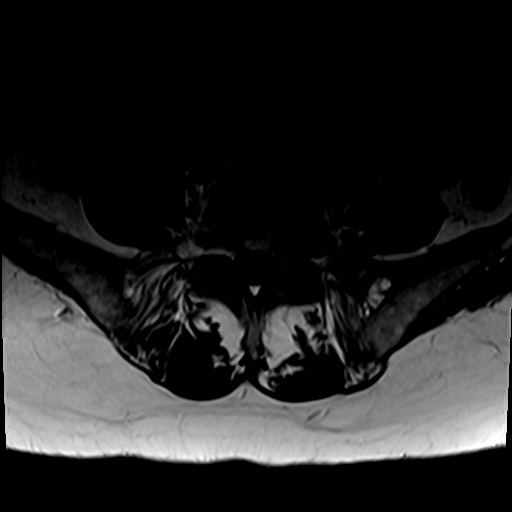
[im 11/36]
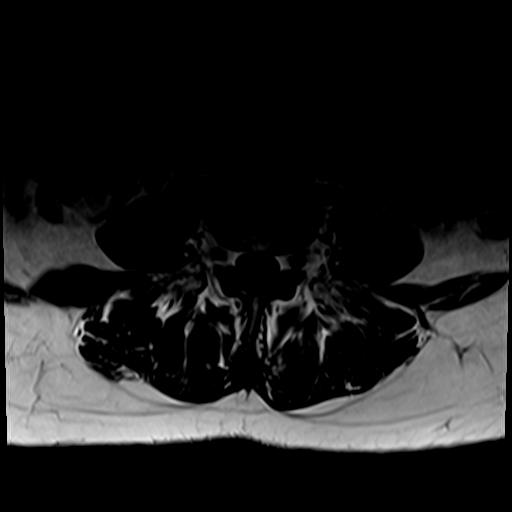
[im 17/36]
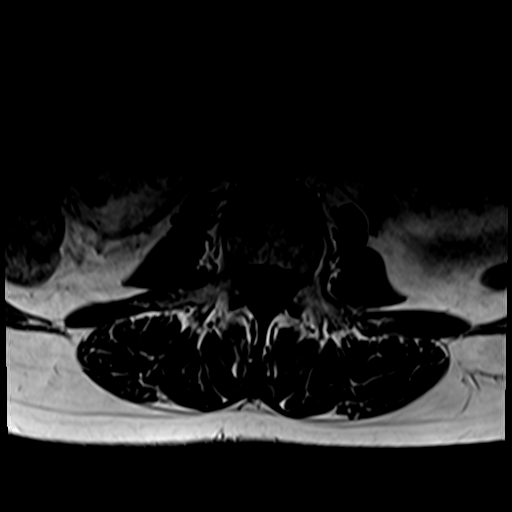
[im 19/36]
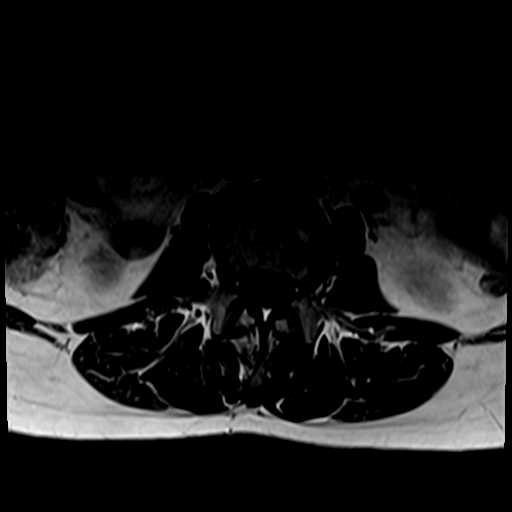
[im 25/36]
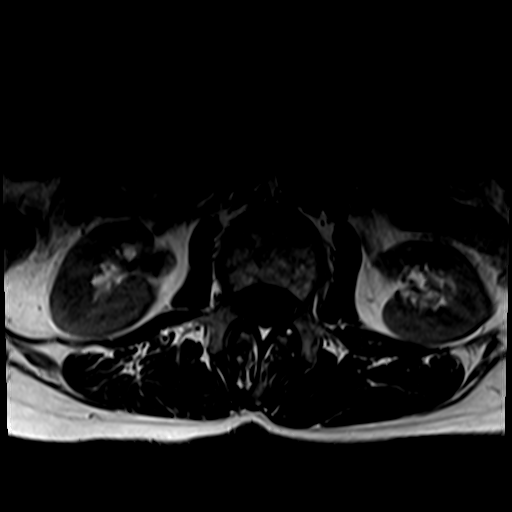
[im 30/36]
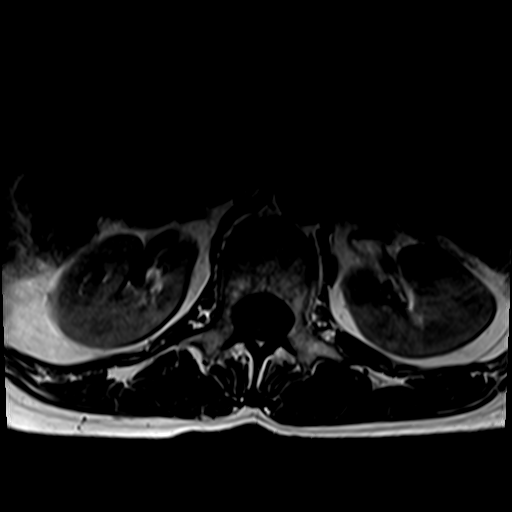
[im 36/36]
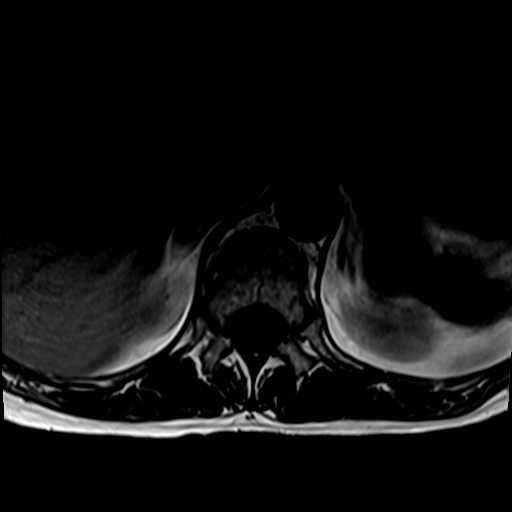

[31 of 48 positions shown; findings below may reference images not displayed]

FINDINGS: Segmentation:  5 lumbar type vertebral bodies assumed.

Alignment:  Minimal curvature convex to the left.

Vertebrae:  No fracture or focal bone lesion.

Conus medullaris and cauda equina: Conus extends to the L1 level.
Conus and cauda equina appear normal.

Paraspinal and other soft tissues: Negative

Disc levels:

T12-L1: Normal

L1-2: Minimal disc bulge.  No stenosis.

L2-3: Minimal disc bulge.  No stenosis.

L3-4: Mild desiccation and bulging of the disc. Mild narrowing of
the lateral recesses but no likely neural compression.

L4-5: Mild bulging of the disc.  No stenosis.

L5-S1: Disc degeneration with endplate osteophytes and bulging of
the disc more prominent in the left posterolateral direction. Facet
degeneration worse on the left. Stenosis of the subarticular lateral
recess and intervertebral foramen on the left that could compress
both the left L5 and S1 nerves.
IMPRESSION: Left-sided predominant degenerative changes at L5-S1 with stenosis
of the left subarticular lateral recess and intervertebral foramen
which could compress the left L5 and S1 nerves.

Mild, non-compressive disc bulges at the other levels.

## 2021-02-14 IMAGING — MR MR CERVICAL SPINE W/O CM
5 series · 39 of 48 positions shown · non-contrast
Comparison: [DATE]

CLINICAL DATA: Neck and left arm and shoulder pain. Surgery in
[REDACTED].

EXAM:
MRI CERVICAL SPINE WITHOUT CONTRAST
TECHNIQUE: Multiplanar, multisequence MR imaging of the cervical spine was
performed. No intravenous contrast was administered.

[Series 5: T2 · sagittal · 3.0mm · 0.62mm/px · 6 of 15 slices shown (1 of 2)]
[im 1/15]
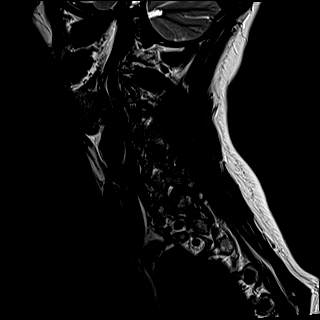
[im 3/15]
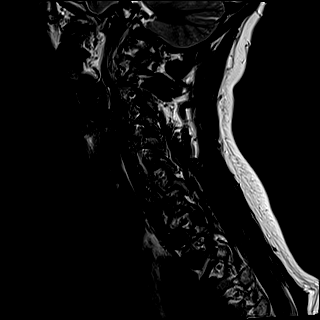
[im 6/15]
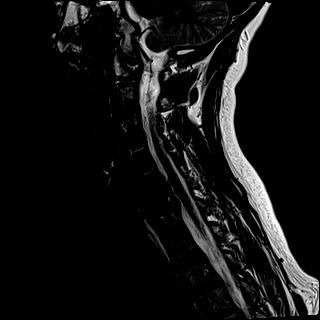
[im 9/15]
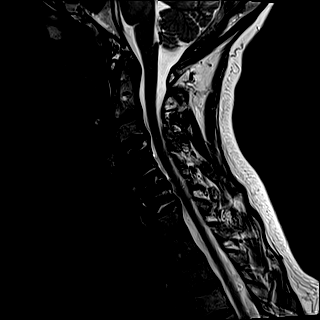
[im 12/15]
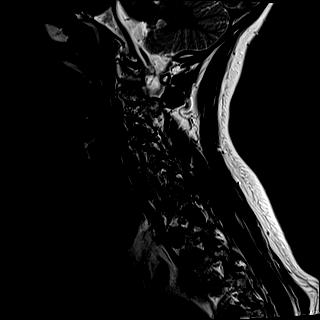
[im 15/15]
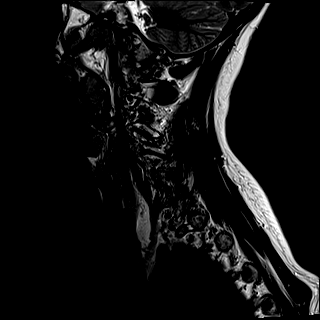

[Series 6: FLAIR · sagittal · 3.0mm · 0.78mm/px · 7 of 15 slices shown]
[im 1/15]
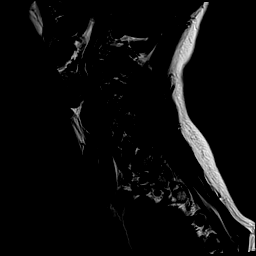
[im 3/15]
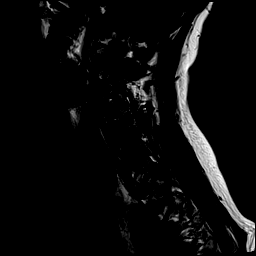
[im 5/15]
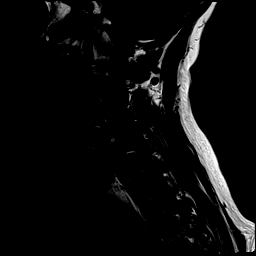
[im 8/15]
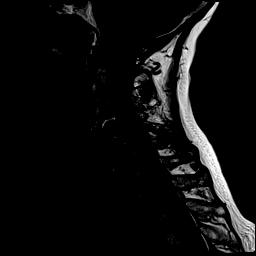
[im 10/15]
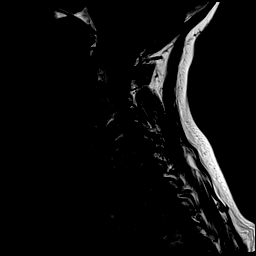
[im 12/15]
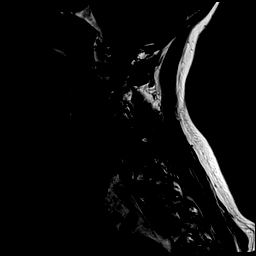
[im 15/15]
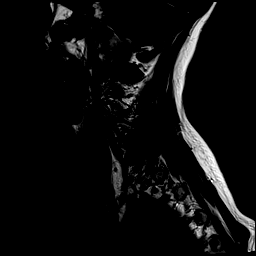

[Series 7: STIR · sagittal · 3.0mm · 0.62mm/px · 7 of 15 slices shown]
[im 1/15]
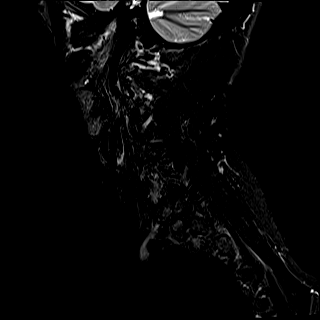
[im 3/15]
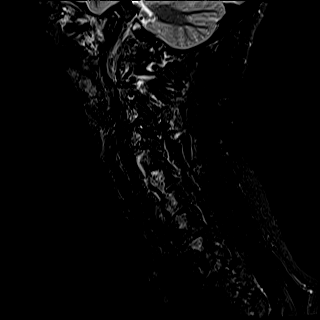
[im 5/15]
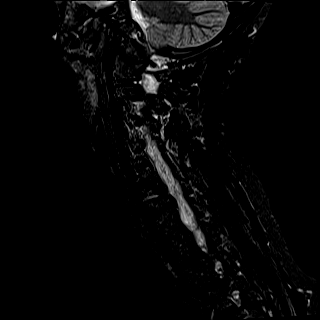
[im 8/15]
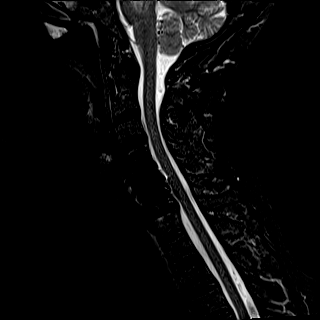
[im 10/15]
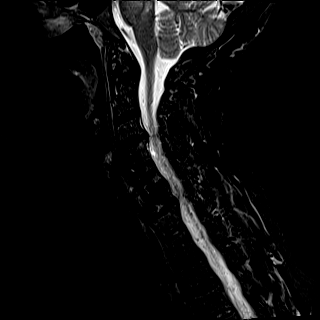
[im 12/15]
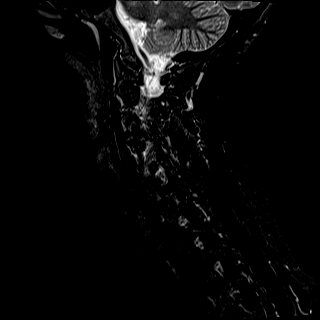
[im 15/15]
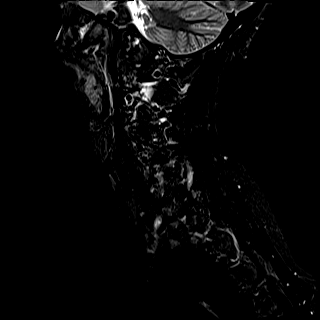

[Series 8: T2 · axial · 3.0mm · 0.70mm/px · z∈[-138,-43]mm · 11 of 29 slices shown (2 of 2)]
[im 1/29]
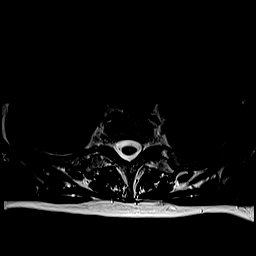
[im 3/29]
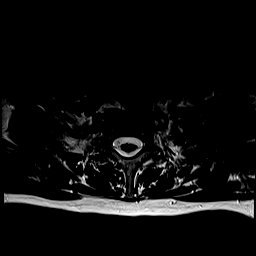
[im 5/29]
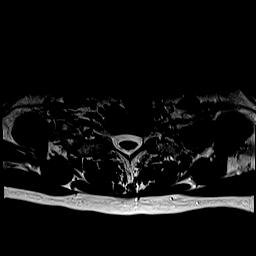
[im 7/29]
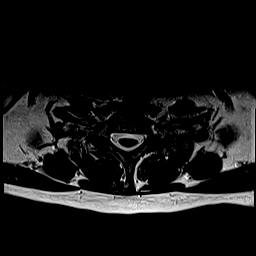
[im 9/29]
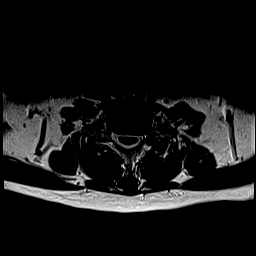
[im 11/29]
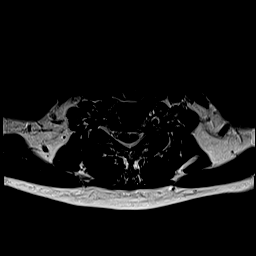
[im 13/29]
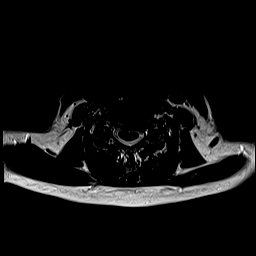
[im 16/29]
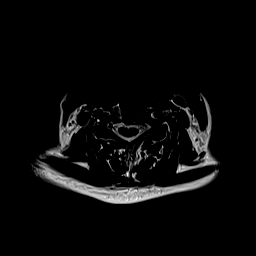
[im 20/29]
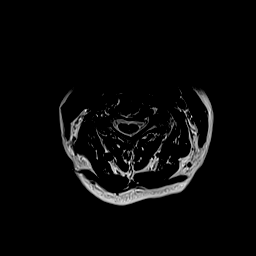
[im 24/29]
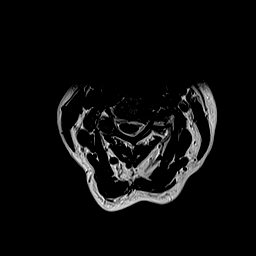
[im 29/29]
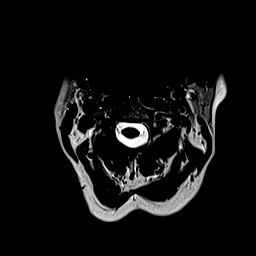

[Series 9: ax mpgr · axial · 3.0mm · 0.35mm/px · z∈[-138,-43]mm · 8 of 29 slices shown]
[im 1/29]
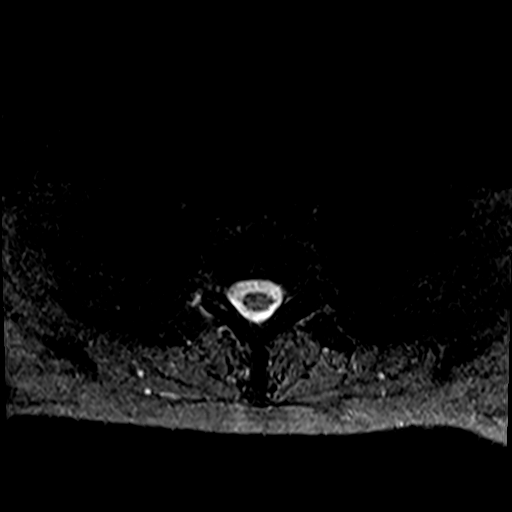
[im 5/29]
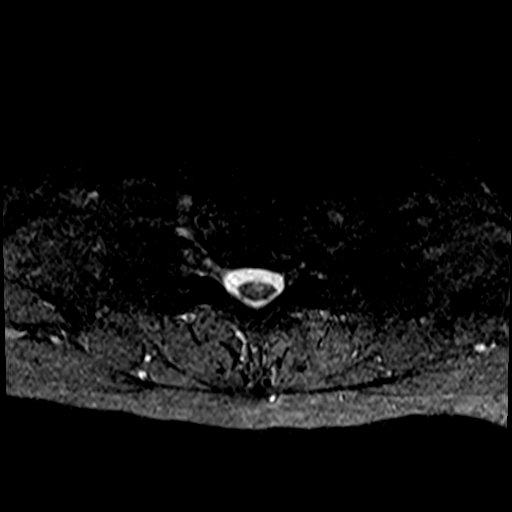
[im 9/29]
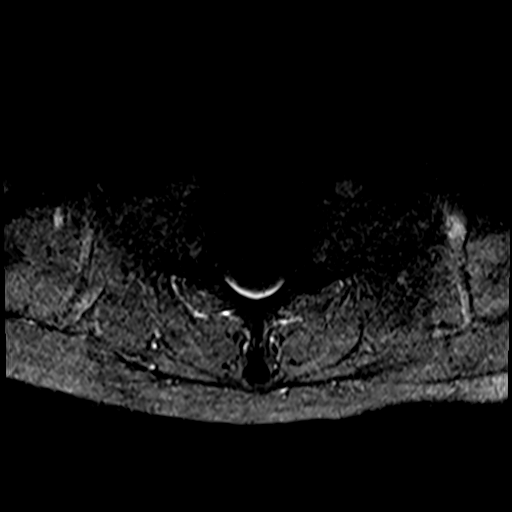
[im 13/29]
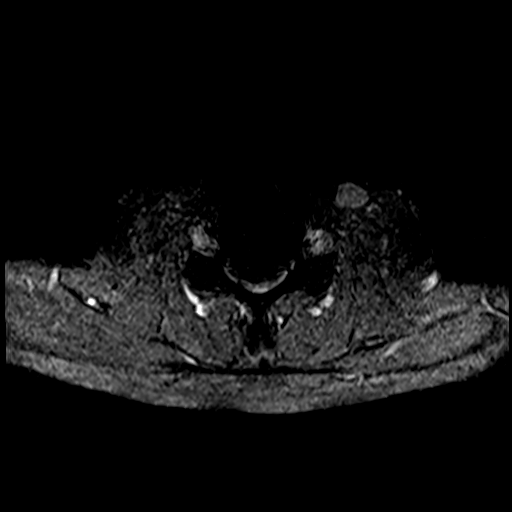
[im 16/29]
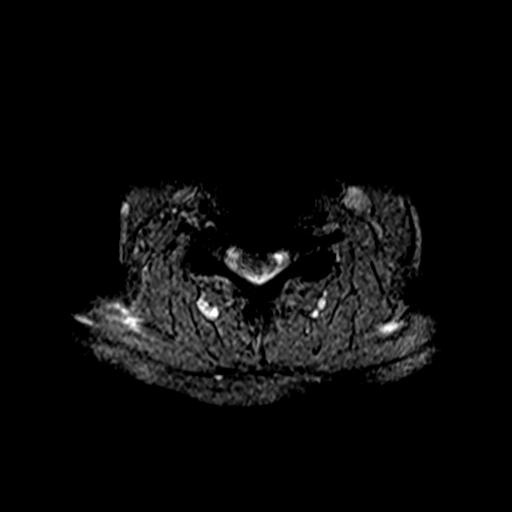
[im 20/29]
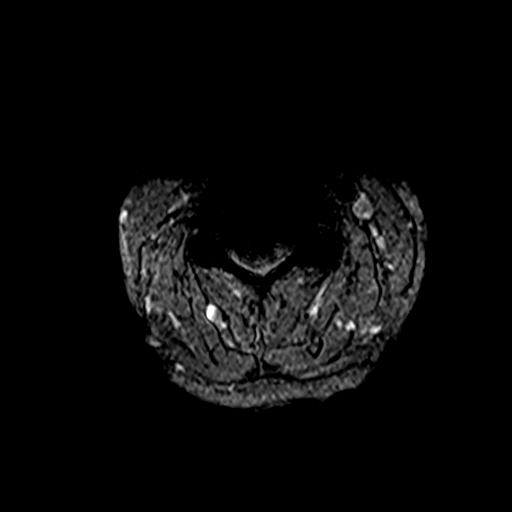
[im 24/29]
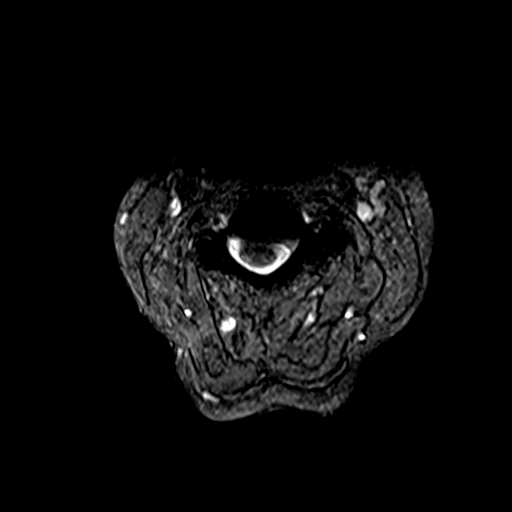
[im 29/29]
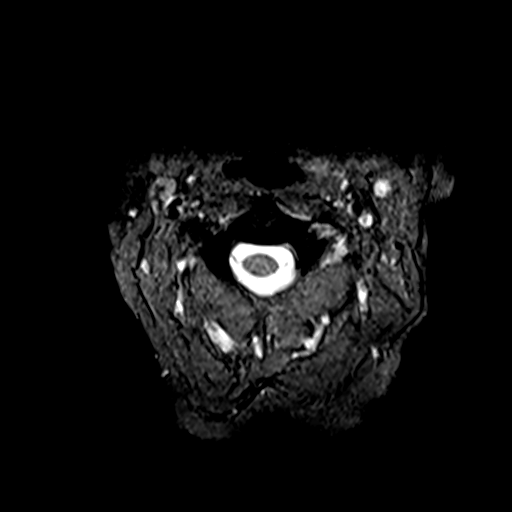

[39 of 48 positions shown; findings below may reference images not displayed]

FINDINGS: Alignment: Straightening of the normal cervical lordosis.

Vertebrae: Previous ACDF C4 through C6.

Cord: No cord compression or focal cord lesion.

Posterior Fossa, vertebral arteries, paraspinal tissues: Negative

Disc levels:

Foramen magnum is widely patent.  C1-2 is normal.

C2-3: Facet osteoarthritis of a mild degree. No compressive
narrowing of the canal or foramina.

C3-4: Left-sided predominant spondylosis and facet arthropathy. No
cord compression. Severe left foraminal stenosis likely to affect
the left C4 nerve.

C4 through C6: Previous ACDF has a good appearance. Apparent wide
patency of the canal and foramina.

C6-7: Endplate osteophytes and mild bulging of the disc. No central
canal stenosis. Mild to moderate foraminal narrowing on the left.

C7-T1: Normal interspace.
IMPRESSION: Previous ACDF C4 through C6 as a good appearance. Sufficient patency
of the canal and foramina.

Left-sided predominant spondylosis and facet arthropathy at C3-4
with severe left foraminal stenosis that could well compress the
left C4 nerve.

Left-sided predominant endplate osteophytes and bulging of the disc
at C6-7 with mild to moderate left foraminal narrowing that could
possibly affect the left C7 nerve.

## 2021-02-14 MED ORDER — SODIUM CHLORIDE (PF) 0.9 % IJ SOLN
20.0000 mL | INTRAMUSCULAR | Status: DC | PRN
  Administered 2021-02-14: 20 mL via INTRAVENOUS

## 2021-02-14 MED ORDER — GADOBUTROL 1 MMOL/ML IV SOLN
0.1000 mL/kg | Freq: Once | INTRAVENOUS | Status: AC | PRN
  Administered 2021-02-14: 0.05 mL

## 2021-02-14 MED ORDER — LIDOCAINE HCL (PF) 1 % IJ SOLN
5.0000 mL | Freq: Once | INTRAMUSCULAR | Status: AC
  Administered 2021-02-14: 5 mL
  Filled 2021-02-14: qty 5

## 2021-05-07 ENCOUNTER — Encounter: Payer: Self-pay | Admitting: Physical Therapy

## 2021-05-07 ENCOUNTER — Ambulatory Visit: Attending: Neurosurgery | Admitting: Physical Therapy

## 2021-05-07 ENCOUNTER — Other Ambulatory Visit: Payer: Self-pay

## 2021-05-07 DIAGNOSIS — M25512 Pain in left shoulder: Secondary | ICD-10-CM | POA: Insufficient documentation

## 2021-05-07 DIAGNOSIS — M5441 Lumbago with sciatica, right side: Secondary | ICD-10-CM | POA: Diagnosis present

## 2021-05-07 DIAGNOSIS — G8929 Other chronic pain: Secondary | ICD-10-CM | POA: Diagnosis present

## 2021-05-07 DIAGNOSIS — M5442 Lumbago with sciatica, left side: Secondary | ICD-10-CM | POA: Insufficient documentation

## 2021-05-07 DIAGNOSIS — M5412 Radiculopathy, cervical region: Secondary | ICD-10-CM | POA: Diagnosis present

## 2021-05-07 DIAGNOSIS — M542 Cervicalgia: Secondary | ICD-10-CM | POA: Insufficient documentation

## 2021-05-07 NOTE — Therapy (Signed)
[facility] [address] [place], [place], [address] Phone: [phone]   Fax:  [phone]  Physical Therapy Evaluation  Patient Details  Name: Jillian Ward MRN: [id number] Date of Birth: [birth year] Referring Provider (PT): Venetia Night, MD   Encounter Date: 05/07/2021   PT End of Session - 05/07/21 1520     Visit Number 1    Number of Visits 17    Date for PT Re-Evaluation 07/02/21    Authorization Type [employer], VL based on medical necessity    Progress Note Due on Visit 10    PT Start Time 1342    PT Stop Time 1440    PT Time Calculation (min) 58 min    Activity Tolerance Patient limited by pain    Behavior During Therapy [facility] for tasks assessed/performed             Past Medical History:  Diagnosis Date   Anxiety    Arthritis    rheumatoid   Depression    Dyspnea    Hypertension    Nodule of right lung    Rib fracture     Past Surgical History:  Procedure Laterality Date   ANTERIOR CERVICAL DECOMP/DISCECTOMY FUSION N/A 10/18/2020   Procedure: C4-6 ANTERIOR CERVICAL DECOMPRESSION/DISCECTOMY FUSION 2 LEVELS;  Surgeon: Venetia Night, MD;  Location: [facility] ORS;  Service: Neurosurgery;  Laterality: N/A;  schedule as 1st case   APPENDECTOMY     CESAREAN SECTION     COLONOSCOPY WITH PROPOFOL N/A 01/15/2016   Procedure: COLONOSCOPY WITH PROPOFOL;  Surgeon: Scot Jun, MD;  Location: [facility] ENDOSCOPY;  Service: Endoscopy;  Laterality: N/A;   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   Procedure: 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    Subjective Assessment - 05/07/21 1515     Subjective Patient is a 56 year old female s/p  C4-6 ACDF 03/15/21 (7.5 weeks  post-op at initial eval) with referral for cervical radiculopathy and lumbago with sciatica affecting bilateral lower extremities. Referring diagnoses also include chronic pain.    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 Referring diagnoses includes chronic pain. Patient reports pain is primarily affecting L UT region and radiating down full length of L upper limb. Pt has (-) upper limb EMG (study completed 04/16/21). Pt has pain currently along L paracervical/UT region and down her L arm. Patient reports numbness/tingling down to her L hand/fingers without specific distribution to particular digits of R hand. Med hx including RA, anxiety, depression, HTN. Patient reports that she had more R upper quarter pain prior to surgery; more on L side presently. Patient is s/p C6-7 TFESI (03/26/21) without resolution of symptoms. No relief with L shoulder subacromial injection. 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 Patient reports disturbed sleep with change in position relieving one shoulder and then aggravating opposite side. Pt denies diplopia, dysarthria, dysphagia, vertigo. Pt reports some post-operative nausea - none presently. Patient reports losing weight after surgery due to not being able to eat (25 pounds following surgery last July). "Pain feels like 100" in regard to NPRS at  worst. Patient reports constant pain. Pt states she can reach with her R arm well at this time. Primary complaint is left upper quarter; she has comorbid chronic low back pain; back pain is worsened with sitting on lawn mower, prolonged standing, repetitive lumbar flexion. Pt reports she has to sit to relieve low back pain when completing standing work for extended period. Patient reports burning down to her anterior thighs. (+) shopping cart sign. No lower body  numbness/tingling.    Limitations Reading;Sitting;Standing;House hold activities;Lifting    Diagnostic tests MRI cervical and lumbar spine, MRI L shoulder, EMG L upper extremity (see Subjective in initial eval)    Currently in Pain? Yes    Pain Score 5     Aggravating Factors  L upper quarter pain aggravated when medication wears off, reaching, driving, weightbearing onto upper limbs, reaching behind back; low back is aggravated with sitting on lawn mower, prolonged standing, repetitive lumbar flexion    Pain Relieving Factors Gabapentin/Lyrica                [facility] PT Assessment - 05/07/21 1510       Assessment   Medical Diagnosis Radiculopathy, cervical region; Lumbago with sciatica, left side; Lumbago with sciatica, right side; other chronic pain    Referring Provider (PT) Venetia Night, MD    Onset Date/Surgical Date 10/18/20   C4-6 ACDF 10/18/20 (change in status)   Hand Dominance Right    Next MD Visit In 6 weeks    Prior Therapy PT prior to ACDF in 2022      Balance Screen   Has the patient fallen in the past 6 months No      Prior Function   Level of Independence Independent      Cognition   Overall Cognitive Status Within Functional Limits for tasks assessed              SUBJECTIVE Chief complaint: Patient is a 56 year old female s/p  C4-6 ACDF 03/15/21 (7.5 weeks post-op at initial eval) with referral for cervical radiculopathy and lumbago with sciatica affecting bilateral lower extremities. Referring diagnoses also include chronic pain.  Onset: Patient is a 56 year old female s/p  C4-6 ACDF 10/18/20 with referral for cervical radiculopathy and lumbago with sciatica affecting bilateral lower extremities. Referring diagnoses includes chronic pain. Patient reports pain is primarily affecting L UT region and radiating down full length of L upper limb. Pt has (-) upper limb EMG (study completed 04/16/21). Pt has pain currently along L paracervical/UT region and down  her L arm. Patient reports numbness/tingling down to her L hand/fingers without specific distribution to particular digits of R hand. Med hx including RA, anxiety, depression, HTN. Patient reports that she had more R upper quarter pain prior to surgery; more on L side presently. Patient is s/p C6-7 TFESI (03/26/21) without resolution of symptoms. No relief with L shoulder subacromial injection. 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 Patient reports disturbed sleep with change in position relieving one shoulder and then aggravating opposite side. Pt denies diplopia, dysarthria, dysphagia, vertigo. Pt reports some post-operative nausea - none presently. Patient reports losing weight after surgery due to not being able to eat (25 pounds following surgery last July). "Pain feels like 100" in regard to NPRS at worst. Patient reports constant pain. Pt states she can reach with her R arm well at this time. Primary complaint is left upper quarter; she has comorbid chronic low back pain; back pain is worsened with  sitting on lawn mower, prolonged standing, repetitive lumbar flexion. Pt reports she has to sit to relieve low back pain when completing standing work for extended period. Patient reports burning down to her anterior thighs. (+) shopping cart sign. No lower body numbness/tingling.   Referring Dx: Radiculopathy, cervical region; Lumbago with sciatica, left side; Lumbago with sciatica, right side; other chronic pain MD: Venetia Night, MD Pain: 5/10 Present, 4/10 Best, 10/10 Worst Aggravating factors: medication wears off, reaching, driving, weightbearing onto upper limbs, reaching behind back Easing factors: Gabapentin/Lyrica 24 hour pain behavior: worse at night  Recent neck trauma: No Prior history of neck injury or pain: Yes, prior neck pain and s/p ACDF Pain quality: throbbing Radiating pain: Yes down R arm  Numbness/Tingling: Yes, to R  hand/fingers Follow-up appointment with MD: Yes; f/u after 6 weeks of PT Dominant hand: right Imaging: Yes , MRI of lumbar spine, cervical spine, L shoulder.    Impression (from radiologist): Cervical spine MRI  Previous ACDF C4 through C6 as a good appearance. Sufficient patency of the canal and foramina.   Left-sided predominant spondylosis and facet arthropathy at C3-4 with severe left foraminal stenosis that could well compress the left C4 nerve.   Left-sided predominant endplate osteophytes and bulging of the disc at C6-7 with mild to moderate left foraminal narrowing that could possibly affect the left C7 nerve.   Lumbar spine MRI Left-sided predominant degenerative changes at L5-S1 with stenosis of the left subarticular lateral recess and intervertebral foramen which could compress the left L5 and S1 nerves.  Left shoulder MRI Distal supraspinatus and infraspinatus tendinosis. Mild superior labral degeneration at the biceps labral anchor. No high-grade or retracted cuff tear. No displaced labral tear.     OBJECTIVE  Mental Status Patient is oriented to person, place and time.  Recent memory is intact.  Remote memory is intact.  Attention span and concentration are intact.  Expressive speech is intact.  Patient's fund of knowledge is within normal limits for educational level.  SENSATION: Grossly intact to light touch bilateral UE as determined by testing dermatomes C2-T2; L2-L5 Proprioception and hot/cold testing deferred on this date   MUSCULOSKELETAL: Tremor: None Bulk: Normal Tone: Normal  Posture Significantly guarded posture of L upper limb, scapulae elevated and anteriorly tilted. Forward head. Increased thoracic kyphosis.    Palpation Tenderness to palpation along L upper trapezius, L supraspinatus, L infraspinatus  Gait Pt maintains slight forward flexion throughout gait cycle    Strength R/L 4/4-* Shoulder flexion (anterior deltoid/pec  major/coracobrachialis, axillary n. (C5/6) and musculocutaneous n. (C5-7)) 5/3+* Shoulder abduction (deltoid/supraspinatus, axillary/suprascapular n, C5) 5/4-* Shoulder external rotation (infraspinatus/teres minor) 5/4* Shoulder internal rotation (subcapularis/lats/pec major) 5/5* Elbow flexion (biceps brachii, brachialis, brachioradialis, musculoskeletal n, C5/6) 5/4-* Elbow extension (triceps, radial n, C7) 5/4+* Wrist Extension (C6/7) 5/5 Finger adduction (interossei, ulnar n, T1)  (lower quarter myotome quick screen) R/L  5/5 Hip flexion 5/5 Knee extension 5/5 Knee flexion  5/5 Ankle dorsiflexion   AROM R/L 40 Cervical Flexion 38 Cervical Extension 36/36 Cervical Lateral Flexion 85/85 Cervical Rotation *Indicates pain, overpressure performed unless otherwise indicated  154/101* Shoulder flexion 161/97* Shoulder abduction    R/L (all movements include overpressure unless otherwise stated) Lumbar forward flexion (65): 80% (mild tightness, no pain)  Lumbar extension (30): 100%* Lumbar lateral flexion (25): R: 100% L: 100% Thoracic and Lumbar rotation (30 degrees):  R: 100% L: 100%   Repeated Movements Not tested     Passive Accessory Intervertebral Motion (PAIVM) Deferred due  to hx of cervical spinal fusion C4-6   Reflex Testing Biceps (C5/6): R: 2+ L: 2+ Brachioradialis (C5/6): R: 2+ L: 2+ Triceps (C7): R: 2+ L: 2+ Patellar tendon: R 2+, L 2+ Achilles tendon: R 2+, L 2+   SPECIAL TESTS Spurlings A (ipsilateral lateral flexion/axial compression): R: Negative L: Negative Hoffman Sign (cervical cord compression): R: Negative L: Negative Clonus: R Negative, L Negative ULTT Median: R: Not done L: Positive ULTT Ulnar: R: Not done L: Positive ULTT Radial: R: Not done L: Not done   Facet Joint: Lumbar Extension-Rotation (SN 100, -LR 0.0): R: Negative L: Positive     TREATMENT  Therapeutic Exercise - for HEP establishment, discussion on appropriate  exercise/activity modification, PT education  Reviewed baseline home exercises and provided handout for [employer] program (see Access Code); tactile cueing and therapist demonstration utilized as needed for carryover of proper technique to HEP.    Patient education on current condition, anatomy involved, prognosis, plan of care. Discussion on activity modification to prevent flare-up of condition, including avoidance of heavy upper body lifting, repetitive overhead activity with L arm, reducing aggravating positions/postures.     Moist Hot Pack (unbilled) utilized following exam today for analgesic effect and improved soft tissue extensibility; x 8 minutes along L upper trapezius and thoracolumbar spine in prone lying     ASSESSMENT Clinical Impression: Pt is a pleasant 56 year old female with ongoing neck/upper limb symptoms affecting left upper quarter with history of conservative intervention/PT prior to surgery in 2022. History of C4-6 ACDF 10/18/20.  Pt reports improved R upper quarter symptoms following her surgery, but she has experienced pain along L UT and L upper limb with paresthesias. Following surgery, pt has undergone C6-7 steroid injection  and L subacromial injection without improvement in pain. PT examination reveals deficits in L shoulder ROM, L shoulder/elbow strength, postural deficits, L median/ulnar neural tension, taut and tender L upper trapezius and L posterior cuff mm, thoracolumbar flexion motion loss and low back pain that appears to be aggravated with extension-based positions/postures. Pt presents with deficits in strength, mobility, range of motion, and pain. Pt will benefit from skilled PT services to address deficits and return to pain-free function at home and in community.     PT Short Term Goals - 05/07/21 1537       PT SHORT TERM GOAL #1   Title Pt will be independent with HEP in order to improve mobility and decrease pain in order to improve pain-free  function at home and in community.    Baseline 05/07/21: Baseline HEP initiated    Time 3    Period Weeks    Status New    Target Date 05/28/21      PT SHORT TERM GOAL #2   Title Patient will have L shoulder AROM within 10 degrees of contralateral upper limb without reproduction of symptoms as needed for reaching, self-care,    Baseline 05/07/21: Shoulder AROM R/L Flexion 154/101, Abduction 161/97.    Time 4    Period Weeks    Status New    Target Date 06/04/21               PT Long Term Goals - 05/07/21 1452       PT LONG TERM GOAL #1   Title Patient will demonstrate improved function as evidenced by a score of 53 on FOTO measure for full participation in activities at home and in the community.    Baseline 05/07/21: FOTO 34  Time 8    Period Weeks    Status New    Target Date 07/02/21      PT LONG TERM GOAL #2   Title Pt will decrease worst neck pain as reported on NPRS by at least 2 points in order to demonstrate clinically significant reduction in back pain.    Baseline 05/07/21: NPRS 10/10 at worst    Time 8    Period Weeks    Status New    Target Date 07/02/21      PT LONG TERM GOAL #3   Title Patient will have MMT 4/5 or greater for all shoulder motions indicative of increased upper body strength as needed for assistance with bed mobility and performing functional lifting tasks and carrying tasks in home    Baseline 05/07/21: MMT Shoulder R/L Fexion 4/4-, abduction 5/3+, ER 5/4-, IR 5/4    Time 8    Period Weeks    Status New    Target Date 07/02/21      PT LONG TERM GOAL #4   Title Patient will tolerate standing activity up to 1 hour without significant increase in NPRS as needed for completion of household work and errands in community    Baseline 05/07/21: significant difficulty performing upright activity in home    Time 8    Period Weeks    Status New    Target Date 07/02/21                    Plan - 05/07/21 1531     Clinical Impression  Statement Pt is a pleasant 56 year old female with ongoing neck/upper limb symptoms affecting left upper quarter with history of conservative intervention/PT prior to surgery in 2022. History of C4-6 ACDF 10/18/20.  Pt reports improved R upper quarter symptoms following her surgery, but she has experienced pain along L UT and L upper limb with paresthesias. Following surgery, pt has undergone C6-7 steroid injection  and L subacromial injection without improvement in pain. PT examination reveals deficits in L shoulder ROM, L shoulder/elbow strength, postural deficits, L median/ulnar neural tension, thoracolumbar flexion motion loss and low back pain that appears to be aggravated with extension-based positions/postures. Pt presents with deficits in strength, mobility, range of motion, and pain. Pt will benefit from skilled PT services to address deficits and return to pain-free function at home and in community.    Personal Factors and Comorbidities Comorbidity 3+;Past/Current Experience;Time since onset of injury/illness/exacerbation    Comorbidities anxiety, depression, Hx of smoking, s/p C4-6 ACDF    Examination-Activity Limitations Reach Overhead;Bed Mobility;Dressing;Stand;Sit;Bend;Lift;Transfers;Sleep;Carry;Locomotion Level    Examination-Participation Restrictions Cleaning;Community Activity;Driving    Stability/Clinical Decision Making Unstable/Unpredictable    Clinical Decision Making High    Rehab Potential Fair    PT Frequency 2x / week    PT Duration 8 weeks    PT Treatment/Interventions Cryotherapy;Electrical Stimulation;Moist Heat;Therapeutic activities;Therapeutic exercise;Neuromuscular re-education;Manual techniques;Dry needling    PT Next Visit Plan Manual therapy to address sensitivty along L UT, L supraspinatus/infraspinatus and periscapular mm, neurodynamics, postural re-edu. Trial of DN for specific TrP referral patterns prn. Further assess lumbar spine and lower quarter movement  impairments.    PT Home Exercise Plan Access Code [email or web address]    Consulted and Agree with Plan of Care Patient             Patient will benefit from skilled therapeutic intervention in order to improve the following deficits and impairments:  Hypomobility, Difficulty walking, Decreased range of motion, Postural dysfunction,  Pain, Impaired flexibility, Impaired UE functional use, Decreased strength  Visit Diagnosis: Cervicalgia  Left shoulder pain, unspecified chronicity  Radiculopathy, cervical region  Chronic bilateral low back pain with left-sided sciatica  Chronic bilateral low back pain with right-sided sciatica     Problem List Patient Active Problem List   Diagnosis Date Noted   Cervical radiculopathy 10/18/2020   Consuela Mimes, PT, DPT #[id number]  Gertie Exon, PT 05/08/2021, 4:07 PM  [facility] [address] [place], [place], [address] Phone: [phone]   Fax:  [phone]  Name: Jillian Ward MRN: [id number] Date of Birth: Apr 14, 1965

## 2021-05-14 ENCOUNTER — Encounter: Payer: Self-pay | Admitting: Physical Therapy

## 2021-05-14 ENCOUNTER — Ambulatory Visit: Attending: Neurosurgery | Admitting: Physical Therapy

## 2021-05-14 ENCOUNTER — Other Ambulatory Visit: Payer: Self-pay

## 2021-05-14 DIAGNOSIS — M5442 Lumbago with sciatica, left side: Secondary | ICD-10-CM | POA: Diagnosis present

## 2021-05-14 DIAGNOSIS — M79642 Pain in left hand: Secondary | ICD-10-CM | POA: Insufficient documentation

## 2021-05-14 DIAGNOSIS — M25642 Stiffness of left hand, not elsewhere classified: Secondary | ICD-10-CM | POA: Diagnosis present

## 2021-05-14 DIAGNOSIS — G8929 Other chronic pain: Secondary | ICD-10-CM | POA: Diagnosis present

## 2021-05-14 DIAGNOSIS — M25512 Pain in left shoulder: Secondary | ICD-10-CM | POA: Diagnosis present

## 2021-05-14 DIAGNOSIS — M5412 Radiculopathy, cervical region: Secondary | ICD-10-CM | POA: Diagnosis present

## 2021-05-14 DIAGNOSIS — M542 Cervicalgia: Secondary | ICD-10-CM | POA: Diagnosis not present

## 2021-05-14 NOTE — Therapy (Addendum)
[place] [facility] [address] [place], [place], [address] Phone: [phone]   Fax:  [phone]  Physical Therapy Treatment  Patient Details  Name: Jillian Ward MRN: [id number] Date of Birth: 02-27-1966 Referring Provider (PT): Venetia Night, MD   Encounter Date: 05/14/2021   PT End of Session - 05/14/21 1348     Visit Number 2    Number of Visits 17    Date for PT Re-Evaluation 07/02/21    Authorization Type [employer], VL based on medical necessity    Progress Note Due on Visit 10    PT Start Time 1332    PT Stop Time 1414    PT Time Calculation (min) 42 min    Activity Tolerance Patient limited by pain    Behavior During Therapy [facility] for tasks assessed/performed             Past Medical History:  Diagnosis Date   Anxiety    Arthritis    rheumatoid   Depression    Dyspnea    Hypertension    Nodule of right lung    Rib fracture     Past Surgical History:  Procedure Laterality Date   ANTERIOR CERVICAL DECOMP/DISCECTOMY FUSION N/A 10/18/2020   Procedure: C4-6 ANTERIOR CERVICAL DECOMPRESSION/DISCECTOMY FUSION 2 LEVELS;  Surgeon: Venetia Night, MD;  Location: [facility] ORS;  Service: Neurosurgery;  Laterality: N/A;  schedule as 1st case   APPENDECTOMY     CESAREAN SECTION     COLONOSCOPY WITH PROPOFOL N/A 01/15/2016   Procedure: COLONOSCOPY WITH PROPOFOL;  Surgeon: Scot Jun, MD;  Location: [facility] ENDOSCOPY;  Service: Endoscopy;  Laterality: N/A;   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   Procedure: 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   Subjective Assessment - 05/14/21 1333     Subjective Paitent reports 6/10 pain at arrival today with using her medication.  Pt using Lyrica and Tylenol. Patient reports pain is not radiating beyond her elbow today, but it has previously. Patient reports compliance with HEP.    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 Referring diagnoses includes chronic pain. Patient reports pain is primarily affecting L UT region and radiating down full length of L upper limb. Pt has (-) upper limb EMG (study completed 04/16/21). Pt has pain currently along L paracervical/UT region and down her L arm. Patient reports numbness/tingling down to her L hand/fingers without specific distribution to particular digits of R hand. Med hx including RA, anxiety, depression, HTN. Patient reports that she had more R upper quarter pain prior to surgery; more on L side presently. Patient is s/p C6-7 TFESI (03/26/21) without resolution of symptoms. No relief with L shoulder subacromial injection. 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 Patient reports disturbed sleep with change in position relieving one shoulder and then aggravating opposite side. Pt denies diplopia, dysarthria, dysphagia, vertigo. Pt reports some post-operative nausea - none presently. Patient reports losing weight after surgery due to not being able to eat (25 pounds following surgery last July). "Pain feels like 100" in regard to NPRS at worst.  Patient reports constant pain. Pt states she can reach with her R arm well at this time. Primary complaint is left upper quarter; she has comorbid chronic low back pain; back pain is worsened with sitting on lawn mower, prolonged standing, repetitive lumbar flexion. Pt reports she has to sit to relieve low back pain when completing standing work for extended period. Patient reports burning down to her anterior thighs. (+) shopping cart sign. No lower body numbness/tingling.     Limitations Reading;Sitting;Standing;House hold activities;Lifting    Diagnostic tests MRI cervical and lumbar spine, MRI L shoulder, EMG L upper extremity (see Subjective in initial eval)    Pain Onset More than a month ago               TREATMENT    Moist Hot Pack (unbilled) utilized while initiating manual therapy today for analgesic effect and improved soft tissue extensibility; x 5 minutes along L upper trapezius and thoracolumbar spine in prone lying   Manual Therapy - for symptom modulation, soft tissue sensitivity and mobility, joint mobility, ROM   STM/DTM L upper trapezius, L supraspinatus and infraspinatus, L>R cervical paraspinals C3-6 Median and ulnar nerve glides with scapular depression    Trigger Point Dry Needling (TDN), unbilled Education performed with patient regarding potential benefit of TDN. Reviewed precautions and risks with patient. Reviewed special precautions/risks over lung fields which include pneumothorax. Reviewed signs and symptoms of pneumothorax and advised pt to go to ER immediately if these symptoms develop advise them of dry needling treatment. Extensive time spent with pt to ensure full understanding of TDN risks. Pt provided verbal consent to treatment. TDN performed to  L upper trapezius, L supraspinatus, and L infraspinatus with 0.25 x 40 single needle placements with local twitch response (LTR). Pistoning technique utilized. Improved pain-free motion following intervention.    Therapeutic Exercise - for improved soft tissue flexibility and extensibility as needed for ROM, improved strength as needed to improve performance of CKC activities/functional movements  Median nerve gliding; reviewed Scapular retraction; 2x10, 3sec  Patient education: discussed positioning strategies for L upper limb and for cervical spine (grossly neutral position in supine and sidelying) when lying in bed to improve ability to sleep pending symptomatic  response   *next visit* Supine cervical retraction; Shoulder flexion AAROM with dowel in supine;  Lumbar spine repeated movement      ASSESSMENT Patient arrives with excellent motivation to participate in physical therapy. She has significant L shoulder ROM deficits limiting applicability of neurodynamic work. She does still have apparent neural tension with passive median nerve gliding today; pt has difficulty accessing GHJ external rotation for ulnar nerve glide position. Patient does feel that medical management is helping her symptoms, but she is still reporting 6/10 pain while medicated and has ongoing difficulties with sleep hygiene. Discussed at length positioning strategies to improve sleep quality today. Patient has remaining deficits in L shoulder ROM, L shoulder/elbow strength, postural deficits, L median/ulnar neural tension, taut and tender L upper trapezius and L posterior cuff mm, thoracolumbar flexion motion loss and low back pain that appears to be aggravated with extension-based positions/postures. Patient will benefit from continued skilled therapeutic intervention to address the above deficits as needed for improved function and QoL.     PT Short Term Goals - 05/07/21 1537       PT SHORT TERM GOAL #1   Title Pt will be independent with HEP in order to improve mobility and decrease pain in order to improve pain-free function  at home and in community.    Baseline 05/07/21: Baseline HEP initiated    Time 3    Period Weeks    Status New    Target Date 05/28/21      PT SHORT TERM GOAL #2   Title Patient will have L shoulder AROM within 10 degrees of contralateral upper limb without reproduction of symptoms as needed for reaching, self-care,    Baseline 05/07/21: Shoulder AROM R/L Flexion 154/101, Abduction 161/97.    Time 4    Period Weeks    Status New    Target Date 06/04/21               PT Long Term Goals - 05/07/21 1452       PT LONG TERM GOAL #1    Title Patient will demonstrate improved function as evidenced by a score of 53 on FOTO measure for full participation in activities at home and in the community.    Baseline 05/07/21: FOTO 34    Time 8    Period Weeks    Status New    Target Date 07/02/21      PT LONG TERM GOAL #2   Title Pt will decrease worst neck pain as reported on NPRS by at least 2 points in order to demonstrate clinically significant reduction in back pain.    Baseline 05/07/21: NPRS 10/10 at worst    Time 8    Period Weeks    Status New    Target Date 07/02/21      PT LONG TERM GOAL #3   Title Patient will have MMT 4/5 or greater for all shoulder motions indicative of increased upper body strength as needed for assistance with bed mobility and performing functional lifting tasks and carrying tasks in home    Baseline 05/07/21: MMT Shoulder R/L Fexion 4/4-, abduction 5/3+, ER 5/4-, IR 5/4    Time 8    Period Weeks    Status New    Target Date 07/02/21      PT LONG TERM GOAL #4   Title Patient will tolerate standing activity up to 1 hour without significant increase in NPRS as needed for completion of household work and errands in community    Baseline 05/07/21: significant difficulty performing upright activity in home    Time 8    Period Weeks    Status New    Target Date 07/02/21                   Plan - 05/15/21 1323     Clinical Impression Statement Patient arrives with excellent motivation to participate in physical therapy. She has significant L shoulder ROM deficits limiting applicability of neurodynamic work. She does still have apparent neural tension with passive median nerve gliding today; pt has difficulty accessing GHJ external rotation for ulnar nerve glide position. Patient does feel that medical management is helping her symptoms, but she is still reporting 6/10 pain while medicated and has ongoing difficulties with sleep hygiene. Discussed at length positioning strategies to improve  sleep quality today. Patient has remaining deficits in L shoulder ROM, L shoulder/elbow strength, postural deficits, L median/ulnar neural tension, taut and tender L upper trapezius and L posterior cuff mm, thoracolumbar flexion motion loss and low back pain that appears to be aggravated with extension-based positions/postures. Patient will benefit from continued skilled therapeutic intervention to address the above deficits as needed for improved function and QoL.    Personal Factors and Comorbidities Comorbidity  3+;Past/Current Experience;Time since onset of injury/illness/exacerbation    Comorbidities anxiety, depression, Hx of smoking, s/p C4-6 ACDF    Examination-Activity Limitations Reach Overhead;Bed Mobility;Dressing;Stand;Sit;Bend;Lift;Transfers;Sleep;Carry;Locomotion Level    Examination-Participation Restrictions Cleaning;Community Activity;Driving    Stability/Clinical Decision Making Unstable/Unpredictable    Rehab Potential Fair    PT Frequency 2x / week    PT Duration 8 weeks    PT Treatment/Interventions Cryotherapy;Electrical Stimulation;Moist Heat;Therapeutic activities;Therapeutic exercise;Neuromuscular re-education;Manual techniques;Dry needling    PT Next Visit Plan Manual therapy to address sensitivty along L UT, L supraspinatus/infraspinatus and periscapular mm, neurodynamics, postural re-edu. Trial of DN for specific TrP referral patterns prn. Further assess lumbar spine and lower quarter movement impairments.    PT Home Exercise Plan Access Code [email or web address]    Consulted and Agree with Plan of Care Patient             Patient will benefit from skilled therapeutic intervention in order to improve the following deficits and impairments:  Hypomobility, Difficulty walking, Decreased range of motion, Postural dysfunction, Pain, Impaired flexibility, Impaired UE functional use, Decreased strength  Visit Diagnosis: Cervicalgia  Left shoulder pain, unspecified  chronicity  Radiculopathy, cervical region  Chronic bilateral low back pain with left-sided sciatica     Problem List Patient Active Problem List   Diagnosis Date Noted   Cervical radiculopathy 10/18/2020   *Addendum for carryover of plan section*  Consuela MimesJeremy Avrom Robarts, PT, DPT #[id number]  Gertie ExonJeremy T Alohilani Levenhagen, PT 05/15/2021, 1:25 PM  [place] [facility] [address] [place], [place], [address] Phone: [phone]   Fax:  [phone]  Name: Tedra CoupeStacey June Iran OuchStrader MRN: [id number] Date of Birth: [birth year]

## 2021-05-16 ENCOUNTER — Other Ambulatory Visit: Payer: Self-pay

## 2021-05-16 ENCOUNTER — Ambulatory Visit: Admitting: Physical Therapy

## 2021-05-16 DIAGNOSIS — M25512 Pain in left shoulder: Secondary | ICD-10-CM

## 2021-05-16 DIAGNOSIS — M542 Cervicalgia: Secondary | ICD-10-CM | POA: Diagnosis not present

## 2021-05-16 DIAGNOSIS — G8929 Other chronic pain: Secondary | ICD-10-CM

## 2021-05-16 DIAGNOSIS — M5412 Radiculopathy, cervical region: Secondary | ICD-10-CM

## 2021-05-16 NOTE — Therapy (Signed)
[facility] [address] [place], [place], [address] Phone: [phone]   Fax:  [phone]  Physical Therapy Treatment  Patient Details  Name: Jillian Ward MRN: [id number] Date of Birth: 1965/09/05 Referring Provider (PT): Venetia Night, MD   Encounter Date: 05/16/2021   PT End of Session - 05/17/21 1242     Visit Number 3    Number of Visits 17    Date for PT Re-Evaluation 07/02/21    Authorization Type [employer], VL based on medical necessity    Progress Note Due on Visit 10    PT Start Time 1331    PT Stop Time 1420    PT Time Calculation (min) 49 min    Activity Tolerance Patient limited by pain    Behavior During Therapy [facility] for tasks assessed/performed             Past Medical History:  Diagnosis Date   Anxiety    Arthritis    rheumatoid   Depression    Dyspnea    Hypertension    Nodule of right lung    Rib fracture     Past Surgical History:  Procedure Laterality Date   ANTERIOR CERVICAL DECOMP/DISCECTOMY FUSION N/A 10/18/2020   Procedure: C4-6 ANTERIOR CERVICAL DECOMPRESSION/DISCECTOMY FUSION 2 LEVELS;  Surgeon: Venetia Night, MD;  Location: [facility] ORS;  Service: Neurosurgery;  Laterality: N/A;  schedule as 1st case   APPENDECTOMY     CESAREAN SECTION     COLONOSCOPY WITH PROPOFOL N/A 01/15/2016   Procedure: COLONOSCOPY WITH PROPOFOL;  Surgeon: Scot Jun, MD;  Location: [facility] ENDOSCOPY;  Service: Endoscopy;  Laterality: N/A;   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   Procedure: 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   Subjective Assessment - 05/16/21 1334     Subjective Patient reports 5/10 pain at arrival with regular use of medication.  She reports intermittent "shock" the previous evening affecting her L deltopectoral groove and L deltoid region. Patient  reports doing well with scapular isometrics - pt is still challenged by supine median nerve flossing.    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 Referring diagnoses includes chronic pain. Patient reports pain is primarily affecting L UT region and radiating down full length of L upper limb. Pt has (-) upper limb EMG (study completed 04/16/21). Pt has pain currently along L paracervical/UT region and down her L arm. Patient reports numbness/tingling down to her L hand/fingers without specific distribution to particular digits of L hand. Med hx including RA, anxiety, depression, HTN. Patient reports that she had more R upper quarter pain prior to surgery; more on L side presently. Patient is s/p C6-7 TFESI (03/26/21) without resolution of symptoms. No relief with L shoulder subacromial injection. 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 Patient reports disturbed sleep with change in position relieving one shoulder and then aggravating opposite side. Pt denies diplopia, dysarthria, dysphagia, vertigo. Pt reports some post-operative nausea - none presently. Patient reports losing weight after surgery due to not being able to eat (25 pounds following surgery last July). "  Pain feels like 100" in regard to NPRS at worst. Patient reports constant pain. Pt states she can reach with her R arm well at this time. Primary complaint is left upper quarter; she has comorbid chronic low back pain; back pain is worsened with sitting on lawn mower, prolonged standing, repetitive lumbar flexion. Pt reports she has to sit to relieve low back pain when completing standing work for extended period. Patient reports burning down to her anterior  thighs. (+) shopping cart sign. No lower body numbness/tingling.    Limitations Reading;Sitting;Standing;House hold activities;Lifting    Diagnostic tests MRI cervical and lumbar spine, MRI L shoulder, EMG L upper extremity (see Subjective in initial eval)    Currently in Pain? Yes    Pain Score 5     Pain Onset More than a month ago                OBJECTIVE   AROM Cervical flexion:  WNL Cervical extension: Mod motion loss* Lateral flexion: Right WNL , Left Minimal motion loss* *Indicates pain  R/L (all movements include overpressure unless otherwise stated) Lumbar forward flexion (65): 75% (no pain) Lumbar extension (30): 75%* Lumbar lateral flexion (25): R: WFL L: WFL Thoracic and Lumbar rotation (30 degrees):  R: WFL, L: WFL *Indicates pain   Posture Lumbar lordosis: WNL Mild self-selected forward flexed posture with inc thoracic kyphosis Iliac crest height: equal bilaterally Lumbar lateral shift: negative    Palpation No tenderness to palpation along lumbar paraspinals, hip complex bilaterally, or lumbosacral region   PROM Hip IR (0-45): R: WNL L: WNL Hip ER (0-45): R: WNL L: WNL Hip Flexion (0-125): WNL bilaterally Hip Abduction (0-40): R: WNL L: WNL *Indicates pain   Repeated Movements Repeated flexion in sitting: No symptoms during, no pain after   Muscle Length Hamstrings: R: degrees L: degrees  [place]: R WNL, L WNL    Passive Accessory Intervertebral Motion (PAIVM) Pt denies reproduction of back pain with CPA L1-S1. Decreased mobility at L4-S1.    SPECIAL TESTS Lumbar Radiculopathy and Discogenic: Centralization and Peripheralization (SN 92, -LR 0.12): Not examined Slump (SN 83, -LR 0.32): R: Negative L: Negative SLR (SN 92, -LR 0.29): R: Negative L:  Negative Crossed SLR (SP 90): R: Negative L: Negative   Hip: FABER (SN 81): R: Negative L: Negative FADIR (SN 94): R: Negative L: Negative   SIJ:  Thigh Thrust (SN 88, -LR 0.18) : R:  Negative L: Negative       TREATMENT    Therapeutic Activities Additional assessment performed to test for lower quarter complaints (see above)     Therapeutic Exercise - for improved soft tissue flexibility and extensibility as needed for ROM, improved strength as needed to improve performance of CKC activities/functional movements   Seated cervical retraction; 2x10 Median nerve gliding; reviewed Repeated lumbar flexion in sitting; x10   Patient education: HEP update and review. Discussed appropriate symptomatic response for neurodynamic work and modifying ROM prn.    *next visit* Shoulder flexion AAROM with dowel in supine;  *not today* Scapular retraction; 2x10, 3sec       Manual Therapy - for symptom modulation, soft tissue sensitivity and mobility, joint mobility, ROM    STM/DTM L upper trapezius, L supraspinatus and infraspinatus, L>R cervical paraspinals C3-6  *not today* Median and ulnar nerve glides with scapular depression     Trigger Point Dry Needling (TDN), unbilled Education performed with patient regarding potential benefit of TDN. Reviewed precautions and risks with patient. Reviewed  special precautions/risks over lung fields which include pneumothorax. Reviewed signs and symptoms of pneumothorax and advised pt to go to ER immediately if these symptoms develop advise them of dry needling treatment. Extensive time spent with pt to ensure full understanding of TDN risks. Pt provided verbal consent to treatment. TDN performed to  L upper trapezius and  L infraspinatus with 0.25 x 40 single needle placements with local twitch response (LTR). Pistoning technique utilized.     Moist Hot Pack (unbilled) utilized post-treatment today for analgesic effect and improved soft tissue extensibility; x 5 minutes along L upper trapezius in sitting        ASSESSMENT Patient tolerated dry needling well without significant decrease or exacerbation of symptoms. Pt is  still under regular medical management with use of Lyrica and Tylenol. Pt arrives without significant upper extremity referral. Pt has no peripheralizing symptoms with repeated cervical retraction; however, she experiences significant R upper limb pain down to level of elbow when in lying position. Patient maintains L scapular in elevated and protracted/anteriorly tilted position. Symptoms are minimally alleviated with use of positioning with pillow in sling position. Pt does not have clinical findings consistent with lower quarter hard neuro symptoms and no signs of lumbar radiculopathy. Initiated repeated flexion for lumbar spine today with no notable pain before/during/after. Upper quarter pain/LUE referred symptoms are persisting over the last week with moderate level of pain maintained c medical management. Patient has remaining deficits in L shoulder ROM, L shoulder/elbow strength, postural deficits, L median/ulnar neural tension, taut and tender L upper trapezius and L posterior cuff mm, thoracolumbar flexion motion loss and low back pain that appears to be aggravated with extension-based positions/postures. Patient will benefit from continued skilled therapeutic intervention to address the above deficits as needed for improved function and QoL.       PT Short Term Goals - 05/07/21 1537       PT SHORT TERM GOAL #1   Title Pt will be independent with HEP in order to improve mobility and decrease pain in order to improve pain-free function at home and in community.    Baseline 05/07/21: Baseline HEP initiated    Time 3    Period Weeks    Status New    Target Date 05/28/21      PT SHORT TERM GOAL #2   Title Patient will have L shoulder AROM within 10 degrees of contralateral upper limb without reproduction of symptoms as needed for reaching, self-care,    Baseline 05/07/21: Shoulder AROM R/L Flexion 154/101, Abduction 161/97.    Time 4    Period Weeks    Status New    Target Date 06/04/21                PT Long Term Goals - 05/07/21 1452       PT LONG TERM GOAL #1   Title Patient will demonstrate improved function as evidenced by a score of 53 on FOTO measure for full participation in activities at home and in the community.    Baseline 05/07/21: FOTO 34    Time 8    Period Weeks    Status New    Target Date 07/02/21      PT LONG TERM GOAL #2   Title Pt will decrease worst neck pain as reported on NPRS by at least 2 points in order to demonstrate clinically significant reduction in back pain.    Baseline 05/07/21: NPRS 10/10 at worst    Time 8  Period Weeks    Status New    Target Date 07/02/21      PT LONG TERM GOAL #3   Title Patient will have MMT 4/5 or greater for all shoulder motions indicative of increased upper body strength as needed for assistance with bed mobility and performing functional lifting tasks and carrying tasks in home    Baseline 05/07/21: MMT Shoulder R/L Fexion 4/4-, abduction 5/3+, ER 5/4-, IR 5/4    Time 8    Period Weeks    Status New    Target Date 07/02/21      PT LONG TERM GOAL #4   Title Patient will tolerate standing activity up to 1 hour without significant increase in NPRS as needed for completion of household work and errands in community    Baseline 05/07/21: significant difficulty performing upright activity in home    Time 8    Period Weeks    Status New    Target Date 07/02/21                   Plan - 05/17/21 1312     Clinical Impression Statement Patient tolerated dry needling well without significant decrease or exacerbation of symptoms. Pt is still under regular medical management with use of Lyrica and Tylenol. Pt arrives without significant upper extremity referral. Pt has no peripheralizing symptoms with repeated cervical retraction; however, she experiences significant R upper limb pain down to level of elbow when in lying position. Patient maintains L scapular in elevated and protracted/anteriorly tilted  position. Symptoms are minimally alleviated with use of positioning with pillow in sling position. Pt does not have clinical findings consistent with lower quarter hard neuro symptoms and no signs of lumbar radiculopathy. Initiated repeated flexion for lumbar spine today with no notable pain before/during/after. Upper quarter pain/LUE referred symptoms are persisting over the last week with moderate level of pain maintained c medical management. Patient has remaining deficits in L shoulder ROM, L shoulder/elbow strength, postural deficits, L median/ulnar neural tension, taut and tender L upper trapezius and L posterior cuff mm, thoracolumbar flexion motion loss and low back pain that appears to be aggravated with extension-based positions/postures. Patient will benefit from continued skilled therapeutic intervention to address the above deficits as needed for improved function and QoL.    Personal Factors and Comorbidities Comorbidity 3+;Past/Current Experience;Time since onset of injury/illness/exacerbation    Comorbidities anxiety, depression, Hx of smoking, s/p C4-6 ACDF    Examination-Activity Limitations Reach Overhead;Bed Mobility;Dressing;Stand;Sit;Bend;Lift;Transfers;Sleep;Carry;Locomotion Level    Examination-Participation Restrictions Cleaning;Community Activity;Driving    Stability/Clinical Decision Making Unstable/Unpredictable    Rehab Potential Fair    PT Frequency 2x / week    PT Duration 8 weeks    PT Treatment/Interventions Cryotherapy;Electrical Stimulation;Moist Heat;Therapeutic activities;Therapeutic exercise;Neuromuscular re-education;Manual techniques;Dry needling    PT Next Visit Plan Manual therapy to address sensitivty along L UT, L supraspinatus/infraspinatus and periscapular mm, neurodynamics, postural re-edu. Trial of DN for specific TrP referral patterns prn. Further assess lumbar spine and lower quarter movement impairments.    PT Home Exercise Plan Access Code [email or web address]     Consulted and Agree with Plan of Care Patient             Patient will benefit from skilled therapeutic intervention in order to improve the following deficits and impairments:  Hypomobility, Difficulty walking, Decreased range of motion, Postural dysfunction, Pain, Impaired flexibility, Impaired UE functional use, Decreased strength  Visit Diagnosis: Cervicalgia  Left shoulder pain, unspecified chronicity  Radiculopathy, cervical  region  Chronic bilateral low back pain with left-sided sciatica     Problem List Patient Active Problem List   Diagnosis Date Noted   Cervical radiculopathy 10/18/2020   Consuela Mimes, PT, DPT #[id number]  Gertie Exon, PT 05/17/2021, 1:13 PM  [facility] [address] [place], [place], [address] Phone: [phone]   Fax:  [phone]  Name: Jillian Ward MRN: [id number] Date of Birth: 1965-05-23

## 2021-05-17 ENCOUNTER — Encounter: Payer: Self-pay | Admitting: Physical Therapy

## 2021-05-21 ENCOUNTER — Other Ambulatory Visit: Payer: Self-pay

## 2021-05-21 ENCOUNTER — Ambulatory Visit

## 2021-05-21 DIAGNOSIS — M25642 Stiffness of left hand, not elsewhere classified: Secondary | ICD-10-CM

## 2021-05-21 DIAGNOSIS — M542 Cervicalgia: Secondary | ICD-10-CM | POA: Diagnosis not present

## 2021-05-21 DIAGNOSIS — M79642 Pain in left hand: Secondary | ICD-10-CM

## 2021-05-21 DIAGNOSIS — M25512 Pain in left shoulder: Secondary | ICD-10-CM

## 2021-05-21 DIAGNOSIS — M5442 Lumbago with sciatica, left side: Secondary | ICD-10-CM

## 2021-05-21 DIAGNOSIS — G8929 Other chronic pain: Secondary | ICD-10-CM

## 2021-05-21 NOTE — Therapy (Addendum)
[facility] [address] [place], [place], [address] Phone: [phone]   Fax:  [phone]  Physical Therapy Treatment  Patient Details  Name: Jillian Ward MRN: [id number] Date of Birth: 30-Apr-1965 Referring Provider (Ward): Venetia Night, MD   Encounter Date: 05/21/2021   Ward End of Session - 05/21/21 1406     Visit Number 4    Number of Visits 17    Date for Ward Re-Evaluation 07/02/21    Authorization Type [employer], VL based on medical necessity    Progress Note Due on Visit 10    Ward Start Time 1402    Ward Stop Time 1445    Ward Time Calculation (min) 43 min    Activity Tolerance Patient limited by pain    Behavior During Therapy [facility] for tasks assessed/performed              Past Medical History:  Diagnosis Date   Anxiety    Arthritis    rheumatoid   Depression    Dyspnea    Hypertension    Nodule of right lung    Rib fracture     Past Surgical History:  Procedure Laterality Date   ANTERIOR CERVICAL DECOMP/DISCECTOMY FUSION N/A 10/18/2020   Procedure: C4-6 ANTERIOR CERVICAL DECOMPRESSION/DISCECTOMY FUSION 2 LEVELS;  Surgeon: Venetia Night, MD;  Location: [facility] ORS;  Service: Neurosurgery;  Laterality: N/A;  schedule as 1st case   APPENDECTOMY     CESAREAN SECTION     COLONOSCOPY WITH PROPOFOL N/A 01/15/2016   Procedure: COLONOSCOPY WITH PROPOFOL;  Surgeon: Scot Jun, MD;  Location: [facility] ENDOSCOPY;  Service: Endoscopy;  Laterality: N/A;   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   Procedure: 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   Subjective Assessment - 05/21/21 1404     Subjective Ward reports 6/10 L neck/shoulder/LUE pain upon arrival. Ward states  she has been doing her HEP daily, but still has a sharp shooting pain with the median nerve glide. Ward notices no difference after needling and does not believe it is helping her pain.    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 Referring diagnoses includes chronic pain. Patient reports pain is primarily affecting L UT region and radiating down full length of L upper limb. Ward has (-) upper limb EMG (study completed 04/16/21). Ward has pain currently along L paracervical/UT region and down her L arm. Patient reports numbness/tingling down to her L hand/fingers without specific distribution to particular digits of L hand. Med hx including RA, anxiety, depression, HTN. Patient reports that she had more R upper quarter pain prior to surgery; more on L side presently. Patient is s/p C6-7 TFESI (03/26/21) without resolution of symptoms. No relief with L shoulder subacromial injection. 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 Patient reports disturbed sleep with change in position relieving one shoulder and then aggravating opposite side. Ward denies diplopia, dysarthria, dysphagia, vertigo. Ward reports some post-operative nausea - none presently. Patient reports losing weight after surgery due to not being able to eat (25 pounds following surgery last July). "  Pain feels like 100" in regard to NPRS at worst. Patient reports constant pain. Ward states she can reach with her R arm well at this time. Primary complaint is left upper quarter; she has comorbid chronic low back pain; back pain is worsened with sitting on lawn mower, prolonged standing, repetitive lumbar flexion. Ward reports she has to sit to relieve low back pain when completing standing work for extended period. Patient reports burning down to her anterior thighs. (+) shopping cart sign. No lower  body numbness/tingling.    Limitations Reading;Sitting;Standing;House hold activities;Lifting    Diagnostic tests MRI cervical and lumbar spine, MRI L shoulder, EMG L upper extremity (see Subjective in initial eval)    Pain Onset --                 TREATMENT      Therapeutic Exercise - for improved soft tissue flexibility and extensibility as needed for ROM, improved strength as needed to improve performance of CKC activities/functional movements   Seated scapular retraction x 10 w/ 3s hold Seated cervical retraction 2 x 10 Repeated lumbar flexion in sitting x 6  Forward yoga ball roll outs x 10 Seated green tband rows 2 x 10  Seated green tband w's x 10; painful Seated green tband chest press 2 x 10 Shoulder flexion AAROM with dowel in supine x 10      Manual Therapy - for symptom modulation, soft tissue sensitivity and mobility, joint mobility, ROM    STM/DTM L upper trapezius, L supraspinatus and infraspinatus, L>R cervical paraspinals C3-6     Ward educated throughout session about proper posture and technique with exercises. Improved exercise technique, movement at target joints, use of target muscles after min to mod verbal, visual, tactile cues.    Ward did not want to continue with trigger point dry needling due to feeling no difference in sx following the previous two treatments. Focused session on light strengthening and ROM. Seated forward flexion caused pain within her shoulders so switched to forward yoga ball roll outs to provide support for her UE. Ward reported no pain with yoga roll outs. Seated green tband w's caused mild discomfort in Ward's L shoulder. Ward required cueing to properly assist L shoulder with flexion during AAROM flexion with a dowel rod. Ward initially reported pain with AAROM, but once Ward was able to engage her RUE to support the LUE the pain dissipated. Ward would benefit from continued Ward services to address deficits in ROM, strength and pain in order  to return to full pain free function and improve QoL.       Ward Short Term Goals - 05/07/21 1537       Ward SHORT TERM GOAL #1   Title Ward will be independent with HEP in order to improve mobility and decrease pain in order to improve pain-free function at home and in community.    Baseline 05/07/21: Baseline HEP initiated    Time 3    Period Weeks    Status New    Target Date 05/28/21      Ward SHORT TERM GOAL #2   Title Patient will have L shoulder AROM within 10 degrees of contralateral upper limb without reproduction of symptoms as needed for reaching, self-care,    Baseline 05/07/21: Shoulder AROM R/L Flexion 154/101, Abduction 161/97.    Time 4    Period Weeks    Status New    Target Date 06/04/21  Ward Long Term Goals - 05/07/21 1452       Ward LONG TERM GOAL #1   Title Patient will demonstrate improved function as evidenced by a score of 53 on FOTO measure for full participation in activities at home and in the community.    Baseline 05/07/21: FOTO 34    Time 8    Period Weeks    Status New    Target Date 07/02/21      Ward LONG TERM GOAL #2   Title Ward will decrease worst neck pain as reported on NPRS by at least 2 points in order to demonstrate clinically significant reduction in back pain.    Baseline 05/07/21: NPRS 10/10 at worst    Time 8    Period Weeks    Status New    Target Date 07/02/21      Ward LONG TERM GOAL #3   Title Patient will have MMT 4/5 or greater for all shoulder motions indicative of increased upper body strength as needed for assistance with bed mobility and performing functional lifting tasks and carrying tasks in home    Baseline 05/07/21: MMT Shoulder R/L Fexion 4/4-, abduction 5/3+, ER 5/4-, IR 5/4    Time 8    Period Weeks    Status New    Target Date 07/02/21      Ward LONG TERM GOAL #4   Title Patient will tolerate standing activity up to 1 hour without significant increase in NPRS as needed for completion of household work and  errands in community    Baseline 05/07/21: significant difficulty performing upright activity in home    Time 8    Period Weeks    Status New    Target Date 07/02/21                   Plan - 05/21/21 1429     Clinical Impression Statement Ward did not want to continue with trigger point dry needling due to feeling no difference in sx following the previous two treatments. Focused session on light strengthening and ROM. Seated forward flexion caused pain within her shoulders so switched to forward yoga ball roll outs to provide support for her UE. Ward reported no pain with yoga roll outs. Seated green tband w's caused mild discomfort in Ward's L shoulder. Ward required cueing to properly assist L shoulder with flexion during AAROM flexion with a dowel rod. Ward initially reported pain with AAROM, but once Ward was able to engage her RUE to support the LUE the pain dissipated. Ward would benefit from continued Ward services to address deficits in ROM, strength and pain in order to return to full pain free function and improve QoL.    Personal Factors and Comorbidities Comorbidity 3+;Past/Current Experience;Time since onset of injury/illness/exacerbation    Comorbidities anxiety, depression, Hx of smoking, s/p C4-6 ACDF    Examination-Activity Limitations Reach Overhead;Bed Mobility;Dressing;Stand;Sit;Bend;Lift;Transfers;Sleep;Carry;Locomotion Level    Examination-Participation Restrictions Cleaning;Community Activity;Driving    Stability/Clinical Decision Making Unstable/Unpredictable    Rehab Potential Fair    Ward Frequency 2x / week    Ward Duration 8 weeks    Ward Treatment/Interventions Cryotherapy;Electrical Stimulation;Moist Heat;Therapeutic activities;Therapeutic exercise;Neuromuscular re-education;Manual techniques;Dry needling    Ward Next Visit Plan Manual therapy to address sensitivty along L UT, L supraspinatus/infraspinatus and periscapular mm, neurodynamics, postural re-edu. Trial of DN for  specific TrP referral patterns prn. Further assess lumbar spine and lower quarter movement impairments.    Ward Home Exercise Plan Access Code [email or web address]  Consulted and Agree with Plan of Care Patient              Patient will benefit from skilled therapeutic intervention in order to improve the following deficits and impairments:  Hypomobility, Difficulty walking, Decreased range of motion, Postural dysfunction, Pain, Impaired flexibility, Impaired UE functional use, Decreased strength  Visit Diagnosis: Left shoulder pain, unspecified chronicity  Cervicalgia  Pain in left hand  Stiffness of left hand, not elsewhere classified  Chronic bilateral low back pain with left-sided sciatica     Problem List Patient Active Problem List   Diagnosis Date Noted   Cervical radiculopathy 10/18/2020   Jillian Ward, Jillian Ward, Jillian Ward  Jillian Ward SPT Jillian Ward,Jillian Ward, Ward 05/22/2021, 9:19 AM   [facility] [address] [place], [place], [address] Phone: [phone]   Fax:  [phone]  Name: Jillian Ward MRN: [id number] Date of Birth: 06-09-1965

## 2021-05-23 ENCOUNTER — Other Ambulatory Visit: Payer: Self-pay

## 2021-05-23 ENCOUNTER — Ambulatory Visit

## 2021-05-23 DIAGNOSIS — M542 Cervicalgia: Secondary | ICD-10-CM

## 2021-05-23 DIAGNOSIS — M79642 Pain in left hand: Secondary | ICD-10-CM

## 2021-05-23 DIAGNOSIS — M25512 Pain in left shoulder: Secondary | ICD-10-CM

## 2021-05-23 NOTE — Therapy (Signed)
[facility] [address] [place], [place], [address] Phone: [phone]   Fax:  [phone]  Physical Therapy Treatment  Patient Details  Name: Jillian CoupeStacey June Iran Ward MRN: [id number] Date of Birth: 04/07/1966 Referring Provider (PT): Venetia Nighthester Yarbrough, MD   Encounter Date: 05/23/2021   PT End of Session - 05/23/21 1524     Visit Number 5    Number of Visits 17    Date for PT Re-Evaluation 07/02/21    Authorization Type [employer], VL based on medical necessity    Progress Note Due on Visit 10    PT Start Time 1330    PT Stop Time 1420    PT Time Calculation (min) 50 min    Activity Tolerance Patient limited by pain    Behavior [facility] for tasks assessed/performed             Past Medical History:  Diagnosis Date   Anxiety    Arthritis    rheumatoid   Depression    Dyspnea    Hypertension    Nodule of right lung    Rib fracture     Past Surgical History:  Procedure Laterality Date   ANTERIOR CERVICAL DECOMP/DISCECTOMY FUSION N/A 10/18/2020   Procedure: C4-6 ANTERIOR CERVICAL DECOMPRESSION/DISCECTOMY FUSION 2 LEVELS;  Surgeon: Venetia NightYarbrough, Chester, MD;  Location: [facility] ORS;  Service: Neurosurgery;  Laterality: N/A;  schedule as 1st case   APPENDECTOMY     CESAREAN SECTION     COLONOSCOPY WITH PROPOFOL N/A 01/15/2016   Procedure: COLONOSCOPY WITH PROPOFOL;  Surgeon: Scot Junobert T Elliott, MD;  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   Subjective Assessment - 05/23/21 1348     Subjective Pt reports 7/10 pain in L neck/shoulder/arm upon arrival.  She notes  this is a little worse than yesterday.  She as difficulty using her L shoulder and UE due to the pain.    Currently in Pain? Yes    Pain Score 7              Therapeutic Exercise - for improved soft tissue flexibility and extensibility as needed for ROM, improved strength as needed to improve performance of CKC activities/functional movements   Seated cervical retraction; 2x10 Median nerve gliding; PT verbal and visual cues for technique to avoid nerve tensioners  Repeated lumbar flexion in sitting; x10 with physioball roll out Seated scapular retractions 2x10 Rows (Green t-band) 2x12 Press (Green t-band) 2x10 Shoulder flexion AAROM with dowel: x10 chest press to ceiling, then x10 into overhead flexoin   Patient education: HEP update and review. Discussed appropriate symptomatic response for neurodynamic work and modifying ROM prn.     Manual Therapy - for symptom modulation, soft tissue sensitivity and mobility, joint mobility, ROM    STM/DTM L upper trapezius, L supraspinatus and infraspinatus, L>R cervical paraspinals C3-6 Median n sliders with PT (wrist/scapular depression) 2x8 Shoulder PROM: ER, flexion, IR x 10 ea with gentle end range stretch, pt demonstrates m guarding  Shoulder PROM: flexion: 100 deg, ER: 25 deg- tight end feel for both  Pt did not  tol GH jt mobility testing due to pain   Moist Hot Pack (unbilled) utilized post-treatment today for analgesic effect and improved soft tissue extensibility; x 5 minutes along L upper trapezius in sitting       PT Short Term Goals - 05/07/21 1537       PT SHORT TERM GOAL #1   Title Pt will be independent with HEP in order to improve mobility and decrease pain in order to improve pain-free function at home and in community.    Baseline 05/07/21: Baseline HEP initiated    Time 3    Period Weeks    Status New    Target Date 05/28/21      PT SHORT TERM GOAL #2   Title Patient will have L shoulder AROM within 10  degrees of contralateral upper limb without reproduction of symptoms as needed for reaching, self-care,    Baseline 05/07/21: Shoulder AROM R/L Flexion 154/101, Abduction 161/97.    Time 4    Period Weeks    Status New    Target Date 06/04/21               PT Long Term Goals - 05/07/21 1452       PT LONG TERM GOAL #1   Title Patient will demonstrate improved function as evidenced by a score of 53 on FOTO measure for full participation in activities at home and in the community.    Baseline 05/07/21: FOTO 34    Time 8    Period Weeks    Status New    Target Date 07/02/21      PT LONG TERM GOAL #2   Title Pt will decrease worst neck pain as reported on NPRS by at least 2 points in order to demonstrate clinically significant reduction in back pain.    Baseline 05/07/21: NPRS 10/10 at worst    Time 8    Period Weeks    Status New    Target Date 07/02/21      PT LONG TERM GOAL #3   Title Patient will have MMT 4/5 or greater for all shoulder motions indicative of increased upper body strength as needed for assistance with bed mobility and performing functional lifting tasks and carrying tasks in home    Baseline 05/07/21: MMT Shoulder R/L Fexion 4/4-, abduction 5/3+, ER 5/4-, IR 5/4    Time 8    Period Weeks    Status New    Target Date 07/02/21      PT LONG TERM GOAL #4   Title Patient will tolerate standing activity up to 1 hour without significant increase in NPRS as needed for completion of household work and errands in community    Baseline 05/07/21: significant difficulty performing upright activity in home    Time 8    Period Weeks    Status New    Target Date 07/02/21                   Plan - 05/23/21 1525     Clinical Impression Statement Pt's L shoulder AROM and PROM limited due to pain; PT notes end range stiffness with flexion > ER > abd.  Unable to fully assess if end feel is truly capsular or due to pt mm guarding.  Shoulder hypomobility sx's are  consistent with possible presentation of adhesive capsulitis though.  Plan to continue assessing as pt able to tolerate as her c/o pain ultimately was the greatest limiting factor today.  Reviewed  proper technique for nerve flossing exercise.  Ended with MH for pain control after ther ex and manual therapy today.    Personal Factors and Comorbidities Comorbidity 3+;Past/Current Experience;Time since onset of injury/illness/exacerbation    Comorbidities anxiety, depression, Hx of smoking, s/p C4-6 ACDF    Examination-Activity Limitations Reach Overhead;Bed Mobility;Dressing;Stand;Sit;Bend;Lift;Transfers;Sleep;Carry;Locomotion Level    Examination-Participation Restrictions Cleaning;Community Activity;Driving    Stability/Clinical Decision Making Unstable/Unpredictable    Rehab Potential Fair    PT Frequency 2x / week    PT Duration 8 weeks    PT Treatment/Interventions Cryotherapy;Electrical Stimulation;Moist Heat;Therapeutic activities;Therapeutic exercise;Neuromuscular re-education;Manual techniques;Dry needling    PT Next Visit Plan Manual therapy to address sensitivty along L UT, L supraspinatus/infraspinatus and periscapular mm, neurodynamics, postural re-edu. Trial of DN for specific TrP referral patterns prn. Further assess lumbar spine and lower quarter movement impairments.    PT Home Exercise Plan Access Code [email or web address]    Consulted and Agree with Plan of Care Patient             Patient will benefit from skilled therapeutic intervention in order to improve the following deficits and impairments:  Hypomobility, Difficulty walking, Decreased range of motion, Postural dysfunction, Pain, Impaired flexibility, Impaired UE functional use, Decreased strength  Visit Diagnosis: Left shoulder pain, unspecified chronicity  Cervicalgia  Pain in left hand     Problem List Patient Active Problem List   Diagnosis Date Noted   Cervical radiculopathy 10/18/2020    Ardine Bjork,  PT 05/23/2021, 3:35 PM Max Fickle, PT, DPT  [phone]  [facility] [address] [place], [place], [address] Phone: [phone]   Fax:  [phone]  Name: Jillian Ward MRN: [id number] Date of Birth: 1965-05-16

## 2021-05-28 ENCOUNTER — Ambulatory Visit

## 2021-05-28 ENCOUNTER — Other Ambulatory Visit: Payer: Self-pay

## 2021-05-28 DIAGNOSIS — M5412 Radiculopathy, cervical region: Secondary | ICD-10-CM

## 2021-05-28 DIAGNOSIS — M542 Cervicalgia: Secondary | ICD-10-CM | POA: Diagnosis not present

## 2021-05-28 DIAGNOSIS — M6249 Contracture of muscle, multiple sites: Secondary | ICD-10-CM

## 2021-05-28 DIAGNOSIS — M25512 Pain in left shoulder: Secondary | ICD-10-CM

## 2021-05-28 NOTE — Therapy (Addendum)
[facility] [address] [place], [place], [address] Phone: [phone]   Fax:  [phone]  Physical Therapy Treatment  Patient Details  Name: Nassim Klepper Heinly MRN: [id number] Date of Birth: [birth year] Referring Provider (PT): Meade Maw, MD   Encounter Date: 05/28/2021   PT End of Session - 05/28/21 1311     Visit Number 6    Number of Visits 17    Date for PT Re-Evaluation 07/02/21    Authorization Type [employer], VL based on medical necessity    Progress Note Due on Visit --    PT Start Time 1305    PT Stop Time 1348    PT Time Calculation (min) 43 min    Activity Tolerance Patient limited by pain    Behavior During Therapy [facility] for tasks assessed/performed              Past Medical History:  Diagnosis Date   Anxiety    Arthritis    rheumatoid   Depression    Dyspnea    Hypertension    Nodule of right lung    Rib fracture     Past Surgical History:  Procedure Laterality Date   ANTERIOR CERVICAL DECOMP/DISCECTOMY FUSION N/A 10/18/2020   Procedure: C4-6 ANTERIOR CERVICAL DECOMPRESSION/DISCECTOMY FUSION 2 LEVELS;  Surgeon: Meade Maw, MD;  Location: [facility] ORS;  Service: Neurosurgery;  Laterality: N/A;  schedule as 1st case   APPENDECTOMY     CESAREAN SECTION     COLONOSCOPY WITH PROPOFOL N/A 01/15/2016   Procedure: COLONOSCOPY WITH PROPOFOL;  Surgeon: Manya Silvas, MD;  Location: [facility] ENDOSCOPY;  Service: Endoscopy;  Laterality: N/A;   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   Subjective Assessment - 05/28/21 1305     Subjective Pt reports shoulder pain at 5/10 upon arrival. Pain has stayed  around a 5/10 throughout the whole weekend. Pt is managing the pain with Tylenol.              TREATMENT   Therapeutic Exercise - for improved soft tissue flexibility and extensibility as needed for ROM, improved strength as needed to improve performance of CKC activities/functional movements   Seated green tband chest press 2 x 10 Seated cervical retraction 2 x 10 Seated green tband rows 2 x 10 Seated green tband w's x 10; dc after one set secondary to pain Forward yoga ball roll outs x 15 Standing green tband GH extension 2 x 10     Manual Therapy - for symptom modulation, soft tissue sensitivity and mobility, joint mobility, ROM    L shoulder PROM flexion, abduction, and ER L shoulder AAROM with dowel rod in supine x 12 STM/DTM L upper trapezius, L supraspinatus and infraspinatus, L>R cervical paraspinals C3-6    Moist Hot Pack (unbilled) utilized post-treatment today for analgesic effect and improved soft tissue extensibility; x 5 minutes along L upper trapezius in sitting    Pt demonstrated great motivation in session today. Pt is still in moderate amount of pain, especially with movement, so stayed with light ROM and strengthening. Pt reported moderate pain with green tband w's so only one set was completed.  Forward stretch on yoga ball caused pain at end range so instructed pt to not stretch as far forward. Pt reported some tenderness in her L upper trap during STM. Pt would benefit from continued PT services to further address deficits in strength, ROM, and pain in order to return to full function at home.    PT Short Term Goals - 05/07/21 1537       PT SHORT TERM GOAL #1   Title Pt will be independent with HEP in order to improve mobility and decrease pain in order to improve pain-free function at home and in community.    Baseline 05/07/21: Baseline HEP initiated    Time 3    Period Weeks    Status New    Target Date 05/28/21      PT SHORT TERM GOAL #2    Title Patient will have L shoulder AROM within 10 degrees of contralateral upper limb without reproduction of symptoms as needed for reaching, self-care,    Baseline 05/07/21: Shoulder AROM R/L Flexion 154/101, Abduction 161/97.    Time 4    Period Weeks    Status New    Target Date 06/04/21               PT Long Term Goals - 05/07/21 1452       PT LONG TERM GOAL #1   Title Patient will demonstrate improved function as evidenced by a score of 53 on FOTO measure for full participation in activities at home and in the community.    Baseline 05/07/21: FOTO 34    Time 8    Period Weeks    Status New    Target Date 07/02/21      PT LONG TERM GOAL #2   Title Pt will decrease worst neck pain as reported on NPRS by at least 2 points in order to demonstrate clinically significant reduction in back pain.    Baseline 05/07/21: NPRS 10/10 at worst    Time 8    Period Weeks    Status New    Target Date 07/02/21      PT LONG TERM GOAL #3   Title Patient will have MMT 4/5 or greater for all shoulder motions indicative of increased upper body strength as needed for assistance with bed mobility and performing functional lifting tasks and carrying tasks in home    Baseline 05/07/21: MMT Shoulder R/L Fexion 4/4-, abduction 5/3+, ER 5/4-, IR 5/4    Time 8    Period Weeks    Status New    Target Date 07/02/21      PT LONG TERM GOAL #4   Title Patient will tolerate standing activity up to 1 hour without significant increase in NPRS as needed for completion of household work and errands in community    Baseline 05/07/21: significant difficulty performing upright activity in home    Time 8    Period Weeks    Status New    Target Date 07/02/21                   Plan - 05/28/21 1351     Clinical Impression Statement Pt demonstrated great motivation in session today. Pt is still in moderate amount of pain, especially with movement, so stayed with light ROM and strengthening. Pt reported  moderate pain with green tband w's so only one set was completed. Forward stretch on yoga ball caused pain at end range so instructed pt to not stretch as  far forward. Pt reported some tenderness in her L upper trap during STM. Pt would benefit from continued PT services to further address deficits in strength, ROM, and pain in order to return to full function at home.    Personal Factors and Comorbidities Comorbidity 3+;Past/Current Experience;Time since onset of injury/illness/exacerbation    Comorbidities anxiety, depression, Hx of smoking, s/p C4-6 ACDF    Examination-Activity Limitations Reach Overhead;Bed Mobility;Dressing;Stand;Sit;Bend;Lift;Transfers;Sleep;Carry;Locomotion Level    Examination-Participation Restrictions Cleaning;Community Activity;Driving    Stability/Clinical Decision Making Unstable/Unpredictable    Rehab Potential Fair    PT Frequency 2x / week    PT Duration 8 weeks    PT Treatment/Interventions Cryotherapy;Electrical Stimulation;Moist Heat;Therapeutic activities;Therapeutic exercise;Neuromuscular re-education;Manual techniques;Dry needling    PT Next Visit Plan Manual therapy to address sensitivty along L UT, L supraspinatus/infraspinatus and periscapular mm, neurodynamics, postural re-edu. Trial of DN for specific TrP referral patterns prn. Further assess lumbar spine and lower quarter movement impairments.    PT Home Exercise Plan Access Code [email or web address]    Consulted and Agree with Plan of Care Patient              Patient will benefit from skilled therapeutic intervention in order to improve the following deficits and impairments:  Hypomobility, Difficulty walking, Decreased range of motion, Postural dysfunction, Pain, Impaired flexibility, Impaired UE functional use, Decreased strength  Visit Diagnosis: Left shoulder pain, unspecified chronicity  Radiculopathy, cervical region     Problem List Patient Active Problem List   Diagnosis Date Noted    Cervical radiculopathy 10/18/2020   [facility] SPT Phillips Grout PT, DPT, GCS  Huprich,Jason, PT 05/29/2021, 3:55 PM   [facility] [address] [place], [place], [address] Phone: [phone]   Fax:  [phone]  Name: Contessa Ursin Nora MRN: [id number] Date of Birth: 03-05-1966

## 2021-05-30 ENCOUNTER — Ambulatory Visit

## 2021-05-30 ENCOUNTER — Other Ambulatory Visit: Payer: Self-pay

## 2021-05-30 DIAGNOSIS — M5412 Radiculopathy, cervical region: Secondary | ICD-10-CM

## 2021-05-30 DIAGNOSIS — M25512 Pain in left shoulder: Secondary | ICD-10-CM

## 2021-05-30 DIAGNOSIS — M542 Cervicalgia: Secondary | ICD-10-CM | POA: Diagnosis not present

## 2021-05-30 NOTE — Therapy (Signed)
[facility] [address] [place], [place], [address] Phone: [phone]   Fax:  [phone]  Physical Therapy Treatment  Patient Details  Name: Jillian CoupeStacey June Iran Ward MRN: [id number] Date of Birth: 19-Mar-1966 Referring Provider (PT): Venetia Nighthester Yarbrough, MD   Encounter Date: 05/30/2021   PT End of Session - 05/30/21 1748     Visit Number 7    Number of Visits 17    Date for PT Re-Evaluation 07/02/21    Authorization Type [employer], VL based on medical necessity    PT Start Time 1330    PT Stop Time 1415    PT Time Calculation (min) 45 min    Activity Tolerance Patient limited by pain    Behavior [facility] Therapy [facility] for tasks assessed/performed             Past Medical History:  Diagnosis Date   Anxiety    Arthritis    rheumatoid   Depression    Dyspnea    Hypertension    Nodule of right lung    Rib fracture     Past Surgical History:  Procedure Laterality Date   ANTERIOR CERVICAL DECOMP/DISCECTOMY FUSION N/A 10/18/2020   Procedure: C4-6 ANTERIOR CERVICAL DECOMPRESSION/DISCECTOMY FUSION 2 LEVELS;  Surgeon: Venetia NightYarbrough, Chester, MD;  Location: [facility] ORS;  Service: Neurosurgery;  Laterality: N/A;  schedule as 1st case   APPENDECTOMY     CESAREAN SECTION     COLONOSCOPY WITH PROPOFOL N/A 01/15/2016   Procedure: COLONOSCOPY WITH PROPOFOL;  Surgeon: Scot Junobert T Elliott, MD;  Location: [facility] ENDOSCOPY;  Service: Endoscopy;  Laterality: N/A;   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   Subjective Assessment - 05/30/21 1336     Subjective P reports her shoulder is 5/10 pain today upon arrival.  She expresses frustration, feeing discouraged  about being in pain and feels like she does not understand why she is hurting.  She also notes her lower back is bothering her today and she has difficulty with standing for long periods of time like when she does laundry.    Currently in Pain? Yes    Pain Score 5     Pain Location Shoulder               Therapeutic Exercise - for improved soft tissue flexibility and extensibility as needed for ROM, improved strength as needed to improve performance of CKC activities/functional movements   Seated green tband chest press 2 x 10 Seated cervical retraction 2 x 10 Seated green tband rows 2 x 10 Seated green tband w's x 10; dc after one set secondary to pain Forward yoga ball roll outs x 15 Standing green tband GH extension 2 x 10       Manual Therapy - for symptom modulation, soft tissue sensitivity and mobility, joint mobility, ROM    L shoulder PROM flexion, abduction, and ER L shoulder AAROM with dowel rod in supine x 12  Supine shoulder flexion AROM: 100 degrees  STM/DTM L upper trapezius, L supraspinatus and infraspinatus, L subscapularis  Supine shoulder flexion PROM: 105 degrees        Moist Hot Pack (unbilled) utilized post-treatment  today for analgesic effect and improved soft tissue extensibility; x 5 minutes along L upper trapezius in sitting                        PT Short Term Goals - 05/07/21 1537       PT SHORT TERM GOAL #1   Title Pt will be independent with HEP in order to improve mobility and decrease pain in order to improve pain-free function at home and in community.    Baseline 05/07/21: Baseline HEP initiated    Time 3    Period Weeks    Status New    Target Date 05/28/21      PT SHORT TERM GOAL #2   Title Patient will have L shoulder AROM within 10 degrees of contralateral upper limb without reproduction of symptoms as needed for reaching, self-care,    Baseline 05/07/21: Shoulder AROM R/L Flexion 154/101, Abduction 161/97.     Time 4    Period Weeks    Status New    Target Date 06/04/21               PT Long Term Goals - 05/07/21 1452       PT LONG TERM GOAL #1   Title Patient will demonstrate improved function as evidenced by a score of 53 on FOTO measure for full participation in activities at home and in the community.    Baseline 05/07/21: FOTO 34    Time 8    Period Weeks    Status New    Target Date 07/02/21      PT LONG TERM GOAL #2   Title Pt will decrease worst neck pain as reported on NPRS by at least 2 points in order to demonstrate clinically significant reduction in back pain.    Baseline 05/07/21: NPRS 10/10 at worst    Time 8    Period Weeks    Status New    Target Date 07/02/21      PT LONG TERM GOAL #3   Title Patient will have MMT 4/5 or greater for all shoulder motions indicative of increased upper body strength as needed for assistance with bed mobility and performing functional lifting tasks and carrying tasks in home    Baseline 05/07/21: MMT Shoulder R/L Fexion 4/4-, abduction 5/3+, ER 5/4-, IR 5/4    Time 8    Period Weeks    Status New    Target Date 07/02/21      PT LONG TERM GOAL #4   Title Patient will tolerate standing activity up to 1 hour without significant increase in NPRS as needed for completion of household work and errands in community    Baseline 05/07/21: significant difficulty performing upright activity in home    Time 8    Period Weeks    Status New    Target Date 07/02/21                   Plan - 05/30/21 1353     Clinical Impression Statement Pt is very guarded during L shoulder PROM and with attempts to palpate muscles to assess for trigger points around L shoulder/neck muscles. Multiple yellow flags today about her condition. Would likely continue to benefit from gradual graded exercise progression while also incorporating additional pain neuroscience education into POC. Concerned about her developing adhesive capsulitis of L shoulder if  ROM does not improve. PROM shoulder flexion was better than AROM today; however still  demonstrates significant limitations with active flexion, ER, abd, and IR behind back.    Personal Factors and Comorbidities Comorbidity 3+;Past/Current Experience;Time since onset of injury/illness/exacerbation    Comorbidities anxiety, depression, Hx of smoking, s/p C4-6 ACDF    Examination-Activity Limitations Reach Overhead;Bed Mobility;Dressing;Stand;Sit;Bend;Lift;Transfers;Sleep;Carry;Locomotion Level    Examination-Participation Restrictions Cleaning;Community Activity;Driving    Stability/Clinical Decision Making Unstable/Unpredictable    Rehab Potential Fair    PT Frequency 2x / week    PT Duration 8 weeks    PT Treatment/Interventions Cryotherapy;Electrical Stimulation;Moist Heat;Therapeutic activities;Therapeutic exercise;Neuromuscular re-education;Manual techniques;Dry needling    PT Next Visit Plan Manual therapy to address sensitivty along L UT, L supraspinatus/infraspinatus and periscapular mm, neurodynamics, postural re-edu. Trial of DN for specific TrP referral patterns prn. Further assess lumbar spine and lower quarter movement impairments.    PT Home Exercise Plan Access Code [email or web address]    Consulted and Agree with Plan of Care Patient             Patient will benefit from skilled therapeutic intervention in order to improve the following deficits and impairments:  Hypomobility, Difficulty walking, Decreased range of motion, Postural dysfunction, Pain, Impaired flexibility, Impaired UE functional use, Decreased strength  Visit Diagnosis: Left shoulder pain, unspecified chronicity  Radiculopathy, cervical region  Cervicalgia     Problem List Patient Active Problem List   Diagnosis Date Noted   Cervical radiculopathy 10/18/2020    Ardine Bjork, PT 05/30/2021, 6:12 PM Max Fickle, PT, DPT  [phone]  [facility] [address] [place], [place], [address] Phone: [phone]   Fax:  [phone]  Name: Karesha Trzcinski Thuman MRN: [id number] Date of Birth: 05-02-1965

## 2021-06-04 ENCOUNTER — Ambulatory Visit

## 2021-06-04 ENCOUNTER — Other Ambulatory Visit: Payer: Self-pay

## 2021-06-04 ENCOUNTER — Encounter: Payer: Self-pay | Admitting: Physical Therapy

## 2021-06-04 DIAGNOSIS — M542 Cervicalgia: Secondary | ICD-10-CM | POA: Diagnosis not present

## 2021-06-04 DIAGNOSIS — M5412 Radiculopathy, cervical region: Secondary | ICD-10-CM

## 2021-06-04 DIAGNOSIS — M25512 Pain in left shoulder: Secondary | ICD-10-CM

## 2021-06-04 NOTE — Therapy (Addendum)
[facility] [address] [place], [place], [address] Phone: [phone]   Fax:  [phone]  Physical Therapy Treatment  Patient Details  Name: Jillian Ward MRN: [id number] Date of Birth: January 21, 1966 Referring Provider (PT): Meade Maw, MD   Encounter Date: 06/04/2021   PT End of Session - 06/04/21 1358     Visit Number 8    Number of Visits 17    Date for PT Re-Evaluation 07/02/21    Authorization Type [employer], VL based on medical necessity    PT Start Time 1330    PT Stop Time 1413    PT Time Calculation (min) 43 min    Activity Tolerance Patient limited by pain    Behavior During Therapy [facility] for tasks assessed/performed              Past Medical History:  Diagnosis Date   Anxiety    Arthritis    rheumatoid   Depression    Dyspnea    Hypertension    Nodule of right lung    Rib fracture     Past Surgical History:  Procedure Laterality Date   ANTERIOR CERVICAL DECOMP/DISCECTOMY FUSION N/A 10/18/2020   Procedure: C4-6 ANTERIOR CERVICAL DECOMPRESSION/DISCECTOMY FUSION 2 LEVELS;  Surgeon: Meade Maw, MD;  Location: [facility] ORS;  Service: Neurosurgery;  Laterality: N/A;  schedule as 1st case   APPENDECTOMY     CESAREAN SECTION     COLONOSCOPY WITH PROPOFOL N/A 01/15/2016   Procedure: COLONOSCOPY WITH PROPOFOL;  Surgeon: Manya Silvas, MD;  Location: [facility] ENDOSCOPY;  Service: Endoscopy;  Laterality: N/A;   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   Subjective Assessment - 06/04/21 1334     Subjective Pt reports pain upon arrival at a level of 5/10 on NPRS and states that she is having a bad day in  terms of pain. Pt reports having a hard knot on her L upper arm that is painful even without palpation or movement.    Currently in Pain? Yes    Pain Score 5     Pain Location Shoulder            Therapeutic Exercise - for improved soft tissue flexibility and extensibility as needed for ROM, improved strength as needed to improve performance of CKC activities/functional movements   Supine bodyweight GH flexion x 10 Seated cervical retraction 2 x 10 Seated green tband rows 2 x 10 Seated green tband w's 2 x 10; could feel muscles working Forward yoga ball roll outs x 15 Standing green tband GH extension x 10; pain so discontinued        Manual Therapy - for symptom modulation, soft tissue sensitivity and mobility, joint mobility, ROM with pt in supine.  L shoulder PROM flexion, abduction, and ER  STM L upper trapezius, L supraspinatus and infraspinatus, L subscapularis    Education: Educated pt on unspecified firm, painful mass in L arm. Pt instructed to keep an eye on mass and call her GP if the pain increases, the mass grows in size, any changes in skin color, or she notices any other symptoms. PT called and updated pt's PCP  office on findings with verbal consent from pt.   Pt reported she was in a moderate amount of pain and having a "bad shoulder day" so focused session on PROM, AAROM and light strengthening. Pt called PT attention to hard mass on her LUE towards the distal portion of her lateral deltoid. Mass is painful at rest, upon palpation and during movement. There is no redness or bruising noted and pt states she does not recall hitting it on anything. Pt educated on how to properly keep an eye on the area and to report any changes to her GP. Pt reported discomfort with green tband w's around her scapula. Determined discomfort was the muscles working, but instructed pt to only contract half as much to ease discomfort. Pt reported pain during standing GH green tband extension  so discontinued after 1 set. Pt would benefit from further PT services to continue working on deficits of ROM, strength, and pain in order to return to full pain free function at home.     PT Education - 06/04/21 1401     Education Details Contacting PCP about abnormal swelling in L upper arm. Signs/symptoms warranting monitoring and medical care.    Person(s) Educated Patient    Methods Explanation    Comprehension Verbalized understanding              PT Short Term Goals - 05/07/21 1537       PT SHORT TERM GOAL #1   Title Pt will be independent with HEP in order to improve mobility and decrease pain in order to improve pain-free function at home and in community.    Baseline 05/07/21: Baseline HEP initiated    Time 3    Period Weeks    Status New    Target Date 05/28/21      PT SHORT TERM GOAL #2   Title Patient will have L shoulder AROM within 10 degrees of contralateral upper limb without reproduction of symptoms as needed for reaching, self-care,    Baseline 05/07/21: Shoulder AROM R/L Flexion 154/101, Abduction 161/97.    Time 4    Period Weeks    Status New    Target Date 06/04/21               PT Long Term Goals - 05/07/21 1452       PT LONG TERM GOAL #1   Title Patient will demonstrate improved function as evidenced by a score of 53 on FOTO measure for full participation in activities at home and in the community.    Baseline 05/07/21: FOTO 34    Time 8    Period Weeks    Status New    Target Date 07/02/21      PT LONG TERM GOAL #2   Title Pt will decrease worst neck pain as reported on NPRS by at least 2 points in order to demonstrate clinically significant reduction in back pain.    Baseline 05/07/21: NPRS 10/10 at worst    Time 8    Period Weeks    Status New    Target Date 07/02/21      PT LONG TERM GOAL #3   Title Patient will have MMT 4/5 or greater for all shoulder motions indicative of increased upper body strength as needed for assistance  with bed mobility and performing functional lifting tasks and carrying tasks in home    Baseline 05/07/21: MMT Shoulder R/L Fexion 4/4-, abduction 5/3+, ER 5/4-, IR 5/4    Time  8    Period Weeks    Status New    Target Date 07/02/21      PT LONG TERM GOAL #4   Title Patient will tolerate standing activity up to 1 hour without significant increase in NPRS as needed for completion of household work and errands in community    Baseline 05/07/21: significant difficulty performing upright activity in home    Time 8    Period Weeks    Status New    Target Date 07/02/21                   Plan - 06/04/21 1433     Clinical Impression Statement Pt reported she was in a moderate amount of pain and having a "bad shoulder day" so focused session on PROM, AAROM and light strengthening. Pt called PT attention to hard mass on her LUE towards the distal portion of her lateral deltoid. Mass is painful at rest, upon palpation and during movement. There is no redness or bruising noted and pt states she does not recall hitting it on anything. Pt educated on how to properly keep an eye on the area and to report any changes to her GP. Pt reported discomfort with green tband w's around her scapula. Determined discomfort was the muscles working, but instructed pt to only contract half as much to ease discomfort. Pt reported pain during standing GH green tband extension so discontinued after 1 set. Pt would benefit from further PT services to continue working on deficits of ROM, strength, and pain in order to return to full pain free function at home.    Personal Factors and Comorbidities Comorbidity 3+;Past/Current Experience;Time since onset of injury/illness/exacerbation    Comorbidities anxiety, depression, Hx of smoking, s/p C4-6 ACDF    Examination-Activity Limitations Reach Overhead;Bed Mobility;Dressing;Stand;Sit;Bend;Lift;Transfers;Sleep;Carry;Locomotion Level    Examination-Participation Restrictions  Cleaning;Community Activity;Driving    Stability/Clinical Decision Making Unstable/Unpredictable    Rehab Potential Fair    PT Frequency 2x / week    PT Duration 8 weeks    PT Treatment/Interventions Cryotherapy;Electrical Stimulation;Moist Heat;Therapeutic activities;Therapeutic exercise;Neuromuscular re-education;Manual techniques;Dry needling    PT Next Visit Plan Manual therapy to address sensitivty along L UT, L supraspinatus/infraspinatus and periscapular mm, neurodynamics, postural re-edu. Trial of DN for specific TrP referral patterns prn. Further assess lumbar spine and lower quarter movement impairments.    PT Home Exercise Plan Access Code [email or web address]    Consulted and Agree with Plan of Care Patient              Patient will benefit from skilled therapeutic intervention in order to improve the following deficits and impairments:  Hypomobility, Difficulty walking, Decreased range of motion, Postural dysfunction, Pain, Impaired flexibility, Impaired UE functional use, Decreased strength  Visit Diagnosis: Left shoulder pain, unspecified chronicity  Radiculopathy, cervical region     Problem List Patient Active Problem List   Diagnosis Date Noted   Cervical radiculopathy 10/18/2020   [facility] SPT  [place] Caster. Fairly IV, PT, DPT Physical Therapist- [facility]  06/04/2021, 3:37 PM   [facility] [address] [place], [place], [address] Phone: [phone]   Fax:  [phone]  Name: Jillian Ward MRN: [id number] Date of Birth: 1966-01-03

## 2021-06-06 ENCOUNTER — Other Ambulatory Visit: Payer: Self-pay

## 2021-06-06 ENCOUNTER — Ambulatory Visit: Attending: Neurosurgery | Admitting: Physical Therapy

## 2021-06-06 DIAGNOSIS — G8929 Other chronic pain: Secondary | ICD-10-CM | POA: Diagnosis present

## 2021-06-06 DIAGNOSIS — M5412 Radiculopathy, cervical region: Secondary | ICD-10-CM | POA: Insufficient documentation

## 2021-06-06 DIAGNOSIS — M542 Cervicalgia: Secondary | ICD-10-CM | POA: Diagnosis present

## 2021-06-06 DIAGNOSIS — M5442 Lumbago with sciatica, left side: Secondary | ICD-10-CM | POA: Diagnosis present

## 2021-06-06 DIAGNOSIS — M25512 Pain in left shoulder: Secondary | ICD-10-CM | POA: Diagnosis present

## 2021-06-06 NOTE — Therapy (Signed)
[facility] [address] [place], [place], [address] Phone: [phone]   Fax:  [phone]  Physical Therapy Treatment  Patient Details  Name: Jillian Ward MRN: [id number] Date of Birth: February 10, 1966 Referring Provider (PT): Meade Maw, MD   Encounter Date: 06/06/2021   PT End of Session - 06/06/21 1403     Visit Number 9    Number of Visits 17    Date for PT Re-Evaluation 07/02/21    Authorization Type [employer], VL based on medical necessity    PT Start Time 1330    PT Stop Time 1414    PT Time Calculation (min) 44 min    Activity Tolerance Patient limited by pain    Behavior During Therapy [facility] for tasks assessed/performed             Past Medical History:  Diagnosis Date   Anxiety    Arthritis    rheumatoid   Depression    Dyspnea    Hypertension    Nodule of right lung    Rib fracture     Past Surgical History:  Procedure Laterality Date   ANTERIOR CERVICAL DECOMP/DISCECTOMY FUSION N/A 10/18/2020   Procedure: C4-6 ANTERIOR CERVICAL DECOMPRESSION/DISCECTOMY FUSION 2 LEVELS;  Surgeon: Meade Maw, MD;  Location: [facility] ORS;  Service: Neurosurgery;  Laterality: N/A;  schedule as 1st case   APPENDECTOMY     CESAREAN SECTION     COLONOSCOPY WITH PROPOFOL N/A 01/15/2016   Procedure: COLONOSCOPY WITH PROPOFOL;  Surgeon: Manya Silvas, MD;  Location: [facility] ENDOSCOPY;  Service: Endoscopy;  Laterality: N/A;   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   Subjective Assessment - 06/06/21 1331     Subjective Patient reports she is stil limited with functional LUE use at this time. She reports tolerating recent  exercise progressions well. Patient reports pain is mainly along upper arm at this time. She reports 4/10 pain at arrival. She is following up with physician this Friday for lump on arm that appeared atraumatically with no color change, swelling, bruising. Pt reports intermittent non-compliance with her HEP, sometimes performing every other day.               Observation Patient has palpable, ill-defined protrusion along L lateral arm near insertion of middle deltoid with no ecchymosis, no color change, no increased tissue temperature. This protrusion is tender to palpation 3+.      TREATMENT   Therapeutic Exercise - for improved soft tissue flexibility and extensibility as needed for ROM, improved strength as needed to improve performance of CKC activities/functional movements   Reviewed technique for active median nerve glide within tolerated ROM Seated cervical retraction 1 x 10; patient reports no significant symptoms at rest in shoulder/arm, but moderate pain affecting lower cervical spine following repeated cervical retraction Forward yoga ball roll outs x 15 Seated green tband rows 2 x 12 Seated green tband Ws; 2 x 10;    *not today* Standing green tband GH extension x 10; pain so discontinued  Supine bodyweight GH flexion x 10       Manual Therapy - for symptom modulation, soft tissue sensitivity and  mobility, joint mobility, ROM with pt in supine.   STM L upper trapezius, L supraspinatus and infraspinatus L shoulder PROM flexion, abduction, and ER Manual median nerve glide; modified ROM as needed due to brief increase in arm and elbow pain     Patient reports no significant pain in upper trapezius or upper arm following repeated cervical retractions. During manual therapy, discussed concepts of pain neuroscience education including pain not equaling pathology, asymptomatic imaging findings in existing literature, role of pain as "alarm" for perceived threat, role of  graded movement and exercise in dampening pain "alarm," psychosocial and lifestyle contributors to pain experience.        PT Short Term Goals - 05/07/21 1537       PT SHORT TERM GOAL #1   Title Pt will be independent with HEP in order to improve mobility and decrease pain in order to improve pain-free function at home and in community.    Baseline 05/07/21: Baseline HEP initiated    Time 3    Period Weeks    Status New    Target Date 05/28/21      PT SHORT TERM GOAL #2   Title Patient will have L shoulder AROM within 10 degrees of contralateral upper limb without reproduction of symptoms as needed for reaching, self-care,    Baseline 05/07/21: Shoulder AROM R/L Flexion 154/101, Abduction 161/97.    Time 4    Period Weeks    Status New    Target Date 06/04/21               PT Long Term Goals - 05/07/21 1452       PT LONG TERM GOAL #1   Title Patient will demonstrate improved function as evidenced by a score of 53 on FOTO measure for full participation in activities at home and in the community.    Baseline 05/07/21: FOTO 34    Time 8    Period Weeks    Status New    Target Date 07/02/21      PT LONG TERM GOAL #2   Title Pt will decrease worst neck pain as reported on NPRS by at least 2 points in order to demonstrate clinically significant reduction in back pain.    Baseline 05/07/21: NPRS 10/10 at worst    Time 8    Period Weeks    Status New    Target Date 07/02/21      PT LONG TERM GOAL #3   Title Patient will have MMT 4/5 or greater for all shoulder motions indicative of increased upper body strength as needed for assistance with bed mobility and performing functional lifting tasks and carrying tasks in home    Baseline 05/07/21: MMT Shoulder R/L Fexion 4/4-, abduction 5/3+, ER 5/4-, IR 5/4    Time 8    Period Weeks    Status New    Target Date 07/02/21      PT LONG TERM GOAL #4   Title Patient will tolerate standing activity up to 1 hour without significant  increase in NPRS as needed for completion of household work and errands in community    Baseline 05/07/21: significant difficulty performing upright activity in home    Time 8    Period Weeks    Status New    Target Date 07/02/21                   Plan - 06/07/21 1239     Clinical Impression Statement Patient  has quick onset of severe pain affecting L upper arm following re-positioning on treatment table. She has significant ongoing L upper limb pain more so than neck pain at this time. Symptoms are mitigated with repeated cervical retraction, but there is limited carryover for symptom modulation with repeated movement given limited HEP performance outside of clinic. She has been able ot manage pain without Tylenol today, continuing with Lyrica for neuralgia. Pt has follow-up this Friday with [facility] clinic to assess lump along L upper arm that appeared over the previous week without trauma. Patient feels that she is making progress over the last couple of weeks. Patient verbalizes understanding of pain neuroscience concepts today related to suspected sensitization contributing to her condition.    Personal Factors and Comorbidities Comorbidity 3+;Past/Current Experience;Time since onset of injury/illness/exacerbation    Comorbidities anxiety, depression, Hx of smoking, s/p C4-6 ACDF    Examination-Activity Limitations Reach Overhead;Bed Mobility;Dressing;Stand;Sit;Bend;Lift;Transfers;Sleep;Carry;Locomotion Level    Examination-Participation Restrictions Cleaning;Community Activity;Driving    Stability/Clinical Decision Making Unstable/Unpredictable    Rehab Potential Fair    PT Frequency 2x / week    PT Duration 8 weeks    PT Treatment/Interventions Cryotherapy;Electrical Stimulation;Moist Heat;Therapeutic activities;Therapeutic exercise;Neuromuscular re-education;Manual techniques;Dry needling    PT Next Visit Plan Manual therapy to address sensitivty along L UT, L  supraspinatus/infraspinatus and periscapular mm, neurodynamics, postural re-edu. Trial of DN for specific TrP referral patterns prn. Flexion-bias exercise for lumbar spine with progression of low back HEP.    PT Home Exercise Plan Access Code [email or web address]    Consulted and Agree with Plan of Care Patient             Patient will benefit from skilled therapeutic intervention in order to improve the following deficits and impairments:  Hypomobility, Difficulty walking, Decreased range of motion, Postural dysfunction, Pain, Impaired flexibility, Impaired UE functional use, Decreased strength  Visit Diagnosis: Left shoulder pain, unspecified chronicity  Radiculopathy, cervical region     Problem List Patient Active Problem List   Diagnosis Date Noted   Cervical radiculopathy 10/18/2020   Valentina Gu, PT, DPT [id number]  Eilleen Kempf, PT 06/07/2021, 12:47 PM  [facility] [address] [place], [place], [address] Phone: [phone]   Fax:  [phone]  Name: Jillian Ward MRN: [id number] Date of Birth: [birth year]

## 2021-06-07 ENCOUNTER — Encounter: Payer: Self-pay | Admitting: Physical Therapy

## 2021-06-11 ENCOUNTER — Ambulatory Visit: Admitting: Physical Therapy

## 2021-06-11 ENCOUNTER — Other Ambulatory Visit: Payer: Self-pay

## 2021-06-11 ENCOUNTER — Encounter: Payer: Self-pay | Admitting: Physical Therapy

## 2021-06-11 DIAGNOSIS — M25512 Pain in left shoulder: Secondary | ICD-10-CM | POA: Diagnosis not present

## 2021-06-11 DIAGNOSIS — M542 Cervicalgia: Secondary | ICD-10-CM

## 2021-06-11 DIAGNOSIS — G8929 Other chronic pain: Secondary | ICD-10-CM

## 2021-06-11 DIAGNOSIS — M5412 Radiculopathy, cervical region: Secondary | ICD-10-CM

## 2021-06-11 NOTE — Therapy (Signed)
[facility] [address] [place], [place], [address] Phone: [phone]   Fax:  [phone]  Physical Therapy Treatment/Physical Therapy Progress Note   Dates of reporting period  05/07/21   to   06/11/21   Patient Details  Name: Jillian Ward MRN: [id number] Date of Birth: 1965-08-06 Referring Provider (PT): Meade Maw, MD   Encounter Date: 06/11/2021   PT End of Session - 06/12/21 1611     Visit Number 10    Number of Visits 17    Date for PT Re-Evaluation 07/02/21    Authorization Type [employer], VL based on medical necessity    PT Start Time 1330    PT Stop Time 1415    PT Time Calculation (min) 45 min    Activity Tolerance Patient limited by pain    Behavior During Therapy [facility] for tasks assessed/performed             Past Medical History:  Diagnosis Date   Anxiety    Arthritis    rheumatoid   Depression    Dyspnea    Hypertension    Nodule of right lung    Rib fracture     Past Surgical History:  Procedure Laterality Date   ANTERIOR CERVICAL DECOMP/DISCECTOMY FUSION N/A 10/18/2020   Procedure: C4-6 ANTERIOR CERVICAL DECOMPRESSION/DISCECTOMY FUSION 2 LEVELS;  Surgeon: Meade Maw, MD;  Location: [facility] ORS;  Service: Neurosurgery;  Laterality: N/A;  schedule as 1st case   APPENDECTOMY     CESAREAN SECTION     COLONOSCOPY WITH PROPOFOL N/A 01/15/2016   Procedure: COLONOSCOPY WITH PROPOFOL;  Surgeon: Manya Silvas, MD;  Location: [facility] ENDOSCOPY;  Service: Endoscopy;  Laterality: N/A;   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   Subjective Assessment - 06/11/21 1334     Subjective Patient follows  up with rheumatology next week, Dr. Izora Ribas 06/21/21. Patient reports 5/10 pain affecting L paracervical region and along L upper trap and upper arm. Patient reports more pain with decreasing intake of Tylenol; pt is continuing to use Lyrica. Patient reports 5/10 pain at arrival to PT. Patient reports 70% SANE score at this time. Patient reports pain is not higher than 5/10 over the last week. Patient reports aching pain disturbing her sleep causing her to toss and turn (at least 2-3 times sleep getting interrupted). She feels she has made some progress, but has remaining pain and significant L upper limb mobility deficits.    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 Referring diagnoses includes chronic pain. Patient reports pain is primarily affecting L UT region and radiating down full length of L upper limb. Pt has (-) upper limb EMG (study completed 04/16/21). Pt has pain currently along L paracervical/UT region and down her L arm. Patient reports numbness/tingling down to her L hand/fingers without specific distribution to particular digits of L hand. Med hx including RA, anxiety, depression, HTN. Patient reports that she had more R upper quarter pain prior to surgery; more on L side presently. Patient is s/p C6-7 TFESI (03/26/21)  without resolution of symptoms. No relief with L shoulder subacromial injection. 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 Patient reports disturbed sleep with change in position relieving one shoulder and then aggravating opposite side. Pt denies diplopia, dysarthria, dysphagia, vertigo. Pt reports some post-operative nausea - none presently. Patient reports losing weight after surgery due to not being able to eat (25 pounds following surgery last July). "Pain feels like 100" in regard to NPRS at worst.  Patient reports constant pain. Pt states she can reach with her R arm well at this time. Primary complaint is left upper quarter; she has comorbid chronic low back pain; back pain is worsened with sitting on lawn mower, prolonged standing, repetitive lumbar flexion. Pt reports she has to sit to relieve low back pain when completing standing work for extended period. Patient reports burning down to her anterior thighs. (+) shopping cart sign. No lower body numbness/tingling.    Limitations Reading;Sitting;Standing;House hold activities;Lifting    Diagnostic tests MRI cervical and lumbar spine, MRI L shoulder, EMG L upper extremity (see Subjective in initial eval)    Pain Onset More than a month ago              OBJECTIVE FINDINGS    Posture Significantly guarded posture of L upper limb, scapulae elevated and anteriorly tilted. Mild rounded shoulders   Palpation Tenderness to palpation along L upper trapezius, L supraspinatus, L infraspinatus, L middle deltoid   Gait Unremarkable     Strength R/L 4/4* Shoulder flexion (anterior deltoid/pec major/coracobrachialis, axillary n. (C5/6) and musculocutaneous n. (C5-7)) 5/4* Shoulder abduction (deltoid/supraspinatus, axillary/suprascapular n, C5) 5/4-* Shoulder external rotation (infraspinatus/teres minor) 5/4* Shoulder internal rotation (subcapularis/lats/pec major) 5/5* Elbow flexion (biceps brachii, brachialis, brachioradialis, musculoskeletal n, C5/6) 5/4* Elbow extension (triceps, radial n, C7) 5/5 Wrist Extension (C6/7) 5/5 Finger adduction (interossei, ulnar n, T1)  R/L  5/5 Hip extension     AROM R/L 45 Cervical Flexion 45 Cervical Extension 35/37 Cervical Lateral Flexion 60/70 Cervical Rotation *Indicates pain, overpressure performed unless otherwise indicated   154/99* Shoulder flexion 170/110* Shoulder abduction      R/L (all movements include overpressure unless otherwise stated) Lumbar forward flexion (65):  90% (mild tightness in hamstrings, Gower's sign return to neutral) Lumbar extension (30): 100% Lumbar lateral flexion (25): R: 100% L: 100% Thoracic and Lumbar rotation (30 degrees):  R: 100% L: 100%      Passive Accessory Intervertebral Motion (PAIVM) Deferred in cervical spine due to hx of cervical spinal fusion C4-6 Lumbar: Pt denies reproduction of back pain with CPA L1-S1. Decreased mobility at L4-S1.         TREATMENT    Therapeutic Activities  Re-assessment performed (see above and updated Goal section below)  Patient education: Discussed goals attained, prognosis, continued POC pending medical management plan per referring provider     Therapeutic Exercise - for improved soft tissue flexibility and extensibility as needed for ROM, improved strength as needed to improve performance of CKC activities/functional movements  *next visit* Seated green tband rows 2 x 12 Seated green tband Ws; 2 x 10;      *not today* Reviewed technique for active median nerve glide within tolerated ROM Seated cervical retraction 1 x 10; patient reports no significant symptoms at rest in shoulder/arm, but moderate pain affecting lower cervical spine following repeated cervical retraction Forward yoga ball roll outs x 15 Standing green tband GH extension x 10; pain so discontinued  Supine bodyweight GH flexion x 10  MHP (unbilled) utilized during manual therapy along L shoulder for analgesic effect and improved soft tissue extensibility and tolerance to shoulder motion; x 8 minutes    Manual Therapy - for symptom modulation, soft tissue sensitivity and mobility, joint mobility, ROM with pt in supine.   STM L upper trapezius, L supraspinatus and infraspinatus DTM/TPR L upper trapezius L shoulder PROM flexion, abduction, and ER  *not today* Manual median nerve glide; modified ROM as needed due to brief increase in arm and elbow pain             PT Short Term Goals - 06/11/21  1407       PT SHORT TERM GOAL #1   Title Pt will be independent with HEP in order to improve mobility and decrease pain in order to improve pain-free function at home and in community.    Baseline 05/07/21: Baseline HEP initiated.   06/11/21: Pt reports completing home exercises daily at this time, pt verbalizes understanding of exercises    Time 3    Period Weeks    Status Achieved    Target Date 05/28/21      PT SHORT TERM GOAL #2   Title Patient will have L shoulder AROM within 10 degrees of contralateral upper limb without reproduction of symptoms as needed for reaching, self-care,    Baseline 05/07/21: Shoulder AROM R/L Flexion 154/101, Abduction 161/97.  06/12/21: Shoulder AROM R/L Flexion 154/99, Abduction 170/110    Time 4    Period Weeks    Status On-going    Target Date 06/04/21               PT Long Term Goals - 06/11/21 1342       PT LONG TERM GOAL #1   Title Patient will demonstrate improved function as evidenced by a score of 53 on FOTO measure for full participation in activities at home and in the community.    Baseline 05/07/21: FOTO 34.   06/11/21: FOTO 47    Time 8    Period Weeks    Status On-going    Target Date 07/02/21      PT LONG TERM GOAL #2   Title Pt will decrease worst neck pain as reported on NPRS by at least 2 points in order to demonstrate clinically significant reduction in back pain.    Baseline 05/07/21: NPRS 10/10 at worst   06/11/21: pain at 5/10 at worst    Time 8    Period Weeks    Status Achieved    Target Date 07/02/21      PT LONG TERM GOAL #3   Title Patient will have MMT 4/5 or greater for all shoulder motions indicative of increased upper body strength as needed for assistance with bed mobility and performing functional lifting tasks and carrying tasks in home    Baseline 05/07/21: MMT Shoulder R/L Flexion 4/4-, abduction 5/3+, ER 5/4-, IR 5/4.   06/11/21: MMT Shoulder R/L Flexion 4/4, abduction 5/4, ER 5/4-, IR 5/4    Time 8    Period  Weeks    Status Partially Met    Target Date 07/02/21      PT LONG TERM GOAL #4   Title Patient will tolerate standing activity up to 1 hour without significant increase in NPRS as needed for completion of household work and errands in community    Baseline 05/07/21: significant difficulty performing upright activity in home.   06/11/21: pt tolerates standing up to 10 minutes  Time 8    Period Weeks    Status Not Met    Target Date 07/02/21                   Plan - 06/12/21 1626     Clinical Impression Statement Patient has ongoing L upper limb symptoms and significant shoulder mobility deficits. Significant limitation in active and passive ROM is concerning for pt losing active mobility in shoulder required for daily reaching and self-care. She demonstrates fair cervical spine AROM considering pt is s/p ACDF. No significant improvement with dry needling. Pt has short-term decrease in symptoms reported with repeated cervical retraction, and pt has recently improved her HEP compliance. She has increased FOTO score, but has not yet met established FOTO goal. She has modestly improved shoulder flexion and abduction strength, but still has remaining weakness and reproduction of pain with loading into shoulder elevation. Pt demonstrates grossly WFL thoracolumbar AROM, but she does have notable difficulty with return to neutral from end-range flexion. Pt has made fair progress to date in spite of significant impairments and comorbidities/contextual factors affecting her prognosis. Pt is awaiting follow-up with referring physician to determine need for further medical management. Pt will benefit from continued skilled PT intervention to address remaining deficits in L upper quarter pain, low back pain, L shoulder mobility deficits, LUE neural tension, and postural changes as needed for best return to PLOF and improved quality of life.    Personal Factors and Comorbidities Comorbidity  3+;Past/Current Experience;Time since onset of injury/illness/exacerbation    Comorbidities anxiety, depression, Hx of smoking, s/p C4-6 ACDF    Examination-Activity Limitations Reach Overhead;Bed Mobility;Dressing;Stand;Sit;Bend;Lift;Transfers;Sleep;Carry;Locomotion Level    Examination-Participation Restrictions Cleaning;Community Activity;Driving    Stability/Clinical Decision Making Unstable/Unpredictable    Rehab Potential Fair    PT Frequency 2x / week    PT Duration 8 weeks    PT Treatment/Interventions Cryotherapy;Electrical Stimulation;Moist Heat;Therapeutic activities;Therapeutic exercise;Neuromuscular re-education;Manual techniques;Dry needling    PT Next Visit Plan Manual therapy to address sensitivty along L UT, L supraspinatus/infraspinatus and periscapular mm, neurodynamics, postural re-edu. Trial of DN for specific TrP referral patterns prn. Flexion-bias exercise for lumbar spine with progression of low back HEP.    PT Home Exercise Plan Access Code [email or web address]    Consulted and Agree with Plan of Care Patient             Patient will benefit from skilled therapeutic intervention in order to improve the following deficits and impairments:  Hypomobility, Difficulty walking, Decreased range of motion, Postural dysfunction, Pain, Impaired flexibility, Impaired UE functional use, Decreased strength  Visit Diagnosis: Cervicalgia  Left shoulder pain, unspecified chronicity  Radiculopathy, cervical region  Chronic bilateral low back pain with left-sided sciatica     Problem List Patient Active Problem List   Diagnosis Date Noted   Cervical radiculopathy 10/18/2020   Valentina Gu, PT, DPT #[id number]  Eilleen Kempf, PT 06/12/2021, 4:33 PM  [facility] [address] [place], [place], [address] Phone: [phone]   Fax:  [phone]  Name: Xia Stohr Tegethoff MRN: [id number] Date of Birth: 17-Jul-1965

## 2021-06-13 ENCOUNTER — Ambulatory Visit: Admitting: Physical Therapy

## 2021-06-13 ENCOUNTER — Other Ambulatory Visit: Payer: Self-pay

## 2021-06-13 DIAGNOSIS — M542 Cervicalgia: Secondary | ICD-10-CM

## 2021-06-13 DIAGNOSIS — M5412 Radiculopathy, cervical region: Secondary | ICD-10-CM

## 2021-06-13 DIAGNOSIS — M25512 Pain in left shoulder: Secondary | ICD-10-CM | POA: Diagnosis not present

## 2021-06-13 NOTE — Therapy (Signed)
[facility] [address] [place], [place], [address] Phone: [phone]   Fax:  [phone]  Physical Therapy Treatment  Patient Details  Name: Jillian Ward MRN: [id number] Date of Birth: 10/29/1965 Referring Provider (PT): Meade Maw, MD   Encounter Date: 06/13/2021   PT End of Session - 06/16/21 1426     Visit Number 11    Number of Visits 17    Date for PT Re-Evaluation 07/02/21    Authorization Type [employer], VL based on medical necessity    PT Start Time 1331    PT Stop Time 1414    PT Time Calculation (min) 43 min    Activity Tolerance Patient limited by pain    Behavior During Therapy [facility] for tasks assessed/performed             Past Medical History:  Diagnosis Date   Anxiety    Arthritis    rheumatoid   Depression    Dyspnea    Hypertension    Nodule of right lung    Rib fracture     Past Surgical History:  Procedure Laterality Date   ANTERIOR CERVICAL DECOMP/DISCECTOMY FUSION N/A 10/18/2020   Procedure: C4-6 ANTERIOR CERVICAL DECOMPRESSION/DISCECTOMY FUSION 2 LEVELS;  Surgeon: Meade Maw, MD;  Location: [facility] ORS;  Service: Neurosurgery;  Laterality: N/A;  schedule as 1st case   APPENDECTOMY     CESAREAN SECTION     COLONOSCOPY WITH PROPOFOL N/A 01/15/2016   Procedure: COLONOSCOPY WITH PROPOFOL;  Surgeon: Manya Silvas, MD;  Location: [facility] ENDOSCOPY;  Service: Endoscopy;  Laterality: N/A;   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   Subjective Assessment - 06/16/21 1425     Subjective Pt repots using Tyenol in the evening and getting better sleep. She reports not using L arm as much for  yardwork due to L shoulder/arm pain. Patient reports 4/10 pain at arrival to PT. Patient reports not taking Tylenol this AM - only Lyrica today. Patient reports compliance with her HEP.    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 Referring diagnoses includes chronic pain. Patient reports pain is primarily affecting L UT region and radiating down full length of L upper limb. Pt has (-) upper limb EMG (study completed 04/16/21). Pt has pain currently along L paracervical/UT region and down her L arm. Patient reports numbness/tingling down to her L hand/fingers without specific distribution to particular digits of L hand. Med hx including RA, anxiety, depression, HTN. Patient reports that she had more R upper quarter pain prior to surgery; more on L side presently. Patient is s/p C6-7 TFESI (03/26/21) without resolution of symptoms. No relief with L shoulder subacromial injection. 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 Patient reports disturbed sleep with change in position relieving one shoulder and then aggravating opposite side. Pt denies diplopia, dysarthria, dysphagia, vertigo. Pt reports some post-operative nausea - none presently. Patient reports losing weight after surgery due to not being able to eat (25 pounds following surgery last July). "Pain feels like  100" in regard to NPRS at worst. Patient reports constant pain. Pt states she can reach with her R arm well at this time. Primary complaint is left upper quarter; she has comorbid chronic low back pain; back pain is worsened with sitting on lawn mower, prolonged standing, repetitive lumbar flexion. Pt reports she has to sit to relieve low back pain when completing standing work for extended period. Patient reports burning down to her anterior thighs. (+) shopping cart sign. No  lower body numbness/tingling.    Limitations Reading;Sitting;Standing;House hold activities;Lifting    Diagnostic tests MRI cervical and lumbar spine, MRI L shoulder, EMG L upper extremity (see Subjective in initial eval)    Pain Onset More than a month ago                  TREATMENT     Therapeutic Exercise - for improved soft tissue flexibility and extensibility as needed for ROM, improved strength as needed to improve performance of CKC activities/functional movements   Shoulder ER AAROM with dowel; x15  Shoulder flexion AAROM with dowel; 2x10  Posterior pelvic tilt; 2x10 Lower trunk rotations; hooklying; x10 each direction, alternating  Pulleys, flexion; x 1 minute   Seated green tband Ws; 2 x 8;    Pt edu: Provided pulleys for home use to prevent mobility loss for L upper limb. Updated HEP for mobility drills (see Access Code). Reviewed pain science concepts including pain no equaling damage or pathology and use of graded exercise to decrease "threat" to CNS.         *not today* Swiss ball pelvic tilt, Green SWB; Swiss ball march, Green SWB; Seated green tband rows 2 x 12 Reviewed technique for active median nerve glide within tolerated ROM Seated cervical retraction 1 x 10; patient reports no significant symptoms at rest in shoulder/arm, but moderate pain affecting lower cervical spine following repeated cervical retraction Forward yoga ball roll outs x 15 Standing green tband GH extension x 10; pain so discontinued  Supine bodyweight GH flexion x 10     MHP (unbilled) utilized during manual therapy and with initiation of pelvic tilts and LTR; along L shoulder in supine lying for analgesic effect and improved soft tissue extensibility and tolerance to shoulder motion; x 8 minutes     Manual Therapy - for symptom modulation, soft tissue sensitivity and mobility, joint mobility, ROM with pt in supine.   STM L upper trapezius, L supraspinatus and  infraspinatus L shoulder PROM flexion, abduction, and ER  No symptomatic response to general lumbar traction in lying   *not today* Manual median nerve glide; modified ROM as needed due to brief increase in arm and elbow pain            PT Short Term Goals - 06/11/21 1407       PT SHORT TERM GOAL #1   Title Pt will be independent with HEP in order to improve mobility and decrease pain in order to improve pain-free function at home and in community.    Baseline 05/07/21: Baseline HEP initiated.   06/11/21: Pt reports completing home exercises daily at this time, pt verbalizes understanding of exercises    Time 3    Period Weeks    Status Achieved    Target Date 05/28/21      PT SHORT TERM GOAL #2   Title Patient will have L shoulder AROM within 10 degrees of contralateral upper limb without reproduction of symptoms as needed for reaching, self-care,  Baseline 05/07/21: Shoulder AROM R/L Flexion 154/101, Abduction 161/97.  06/12/21: Shoulder AROM R/L Flexion 154/99, Abduction 170/110    Time 4    Period Weeks    Status On-going    Target Date 06/04/21               PT Long Term Goals - 06/11/21 1342       PT LONG TERM GOAL #1   Title Patient will demonstrate improved function as evidenced by a score of 53 on FOTO measure for full participation in activities at home and in the community.    Baseline 05/07/21: FOTO 34.   06/11/21: FOTO 47    Time 8    Period Weeks    Status On-going    Target Date 07/02/21      PT LONG TERM GOAL #2   Title Pt will decrease worst neck pain as reported on NPRS by at least 2 points in order to demonstrate clinically significant reduction in back pain.    Baseline 05/07/21: NPRS 10/10 at worst   06/11/21: pain at 5/10 at worst    Time 8    Period Weeks    Status Achieved    Target Date 07/02/21      PT LONG TERM GOAL #3   Title Patient will have MMT 4/5 or greater for all shoulder motions indicative of increased upper body strength as needed  for assistance with bed mobility and performing functional lifting tasks and carrying tasks in home    Baseline 05/07/21: MMT Shoulder R/L Flexion 4/4-, abduction 5/3+, ER 5/4-, IR 5/4.   06/11/21: MMT Shoulder R/L Flexion 4/4, abduction 5/4, ER 5/4-, IR 5/4    Time 8    Period Weeks    Status Partially Met    Target Date 07/02/21      PT LONG TERM GOAL #4   Title Patient will tolerate standing activity up to 1 hour without significant increase in NPRS as needed for completion of household work and errands in community    Baseline 05/07/21: significant difficulty performing upright activity in home.   06/11/21: pt tolerates standing up to 10 minutes    Time 8    Period Weeks    Status Not Met    Target Date 07/02/21                   Plan - 06/16/21 1430     Clinical Impression Statement Patient still presents with markedly limited shoulder mobility and poor tolerance of shoulder ER passive and AAROM. She has ongoing pain into L arm that has persisted with use of neurodynamics and cervical retraction. Pt does feel that with gradually increasing movement over the last 2 weeks, her condition has modestly improved. She has been able to wean from usage of Tylenol - continuing to use Lyrica for neuralgia management. Updated program for L-spine mobility wtih flexion-bias focus. Patient tolerates low back exercises well and will benefit from continued skilled PT intervention to address deficits in L shoulder ROM and strength, functional LUE use, L upper quarter pain, and recovery of function as needed for improved QoL.    Personal Factors and Comorbidities Comorbidity 3+;Past/Current Experience;Time since onset of injury/illness/exacerbation    Comorbidities anxiety, depression, Hx of smoking, s/p C4-6 ACDF    Examination-Activity Limitations Reach Overhead;Bed Mobility;Dressing;Stand;Sit;Bend;Lift;Transfers;Sleep;Carry;Locomotion Level    Examination-Participation Restrictions Cleaning;Community  Activity;Driving    Stability/Clinical Decision Making Unstable/Unpredictable    Rehab Potential Fair    PT Frequency 2x /  week    PT Duration 8 weeks    PT Treatment/Interventions Cryotherapy;Electrical Stimulation;Moist Heat;Therapeutic activities;Therapeutic exercise;Neuromuscular re-education;Manual techniques;Dry needling    PT Next Visit Plan Manual therapy to address sensitivty along L UT, L supraspinatus/infraspinatus and periscapular mm, neurodynamics, postural re-edu. Pain neuroscience educaton and graded activity/L shoulder ROM. Flexion-bias exercise for lumbar spine with progression of low back HEP.    PT Home Exercise Plan Access Code [email or web address]    Consulted and Agree with Plan of Care Patient             Patient will benefit from skilled therapeutic intervention in order to improve the following deficits and impairments:  Hypomobility, Difficulty walking, Decreased range of motion, Postural dysfunction, Pain, Impaired flexibility, Impaired UE functional use, Decreased strength  Visit Diagnosis: Cervicalgia  Left shoulder pain, unspecified chronicity  Radiculopathy, cervical region     Problem List Patient Active Problem List   Diagnosis Date Noted   Cervical radiculopathy 10/18/2020   Valentina Gu, PT, DPT #[id number]  Eilleen Kempf, PT 06/16/2021, 2:37 PM  [facility] [address] [facility] [place], [place], [address] Phone: [phone]   Fax:  [phone]  Name: Jillian Ward MRN: [id number] Date of Birth: Oct 15, 1965

## 2021-06-16 ENCOUNTER — Encounter: Payer: Self-pay | Admitting: Physical Therapy

## 2021-06-18 ENCOUNTER — Other Ambulatory Visit: Payer: Self-pay

## 2021-06-18 ENCOUNTER — Encounter: Payer: Self-pay | Admitting: Physical Therapy

## 2021-06-18 ENCOUNTER — Ambulatory Visit: Admitting: Physical Therapy

## 2021-06-18 DIAGNOSIS — M25512 Pain in left shoulder: Secondary | ICD-10-CM | POA: Diagnosis not present

## 2021-06-18 DIAGNOSIS — G8929 Other chronic pain: Secondary | ICD-10-CM

## 2021-06-18 DIAGNOSIS — M542 Cervicalgia: Secondary | ICD-10-CM

## 2021-06-18 DIAGNOSIS — M5412 Radiculopathy, cervical region: Secondary | ICD-10-CM

## 2021-06-18 NOTE — Therapy (Unsigned)
[facility] [address] [place], [place], [address] Phone: [phone]   Fax:  [phone]  Physical Therapy Treatment  Patient Details  Name: Jillian Ward MRN: [id number] Date of Birth: [birth year] Referring Provider (PT): Meade Maw, MD   Encounter Date: 06/18/2021   PT End of Session - 06/18/21 1332     Visit Number 12    Number of Visits 17    Date for PT Re-Evaluation 07/02/21    Authorization Type [employer], VL based on medical necessity    PT Start Time 1330    PT Stop Time 1414    PT Time Calculation (min) 44 min    Activity Tolerance Patient limited by pain    Behavior During Therapy [facility] for tasks assessed/performed             Past Medical History:  Diagnosis Date   Anxiety    Arthritis    rheumatoid   Depression    Dyspnea    Hypertension    Nodule of right lung    Rib fracture     Past Surgical History:  Procedure Laterality Date   ANTERIOR CERVICAL DECOMP/DISCECTOMY FUSION N/A 10/18/2020   Procedure: C4-6 ANTERIOR CERVICAL DECOMPRESSION/DISCECTOMY FUSION 2 LEVELS;  Surgeon: Meade Maw, MD;  Location: [facility] ORS;  Service: Neurosurgery;  Laterality: N/A;  schedule as 1st case   APPENDECTOMY     CESAREAN SECTION     COLONOSCOPY WITH PROPOFOL N/A 01/15/2016   Procedure: COLONOSCOPY WITH PROPOFOL;  Surgeon: Manya Silvas, MD;  Location: [facility] ENDOSCOPY;  Service: Endoscopy;  Laterality: N/A;   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   Subjective Assessment - 06/18/21 1331     Subjective Patient reports using Tylenol 1-2x/day, usually in the morning and at night. Patient reports her arm  is still sore, but "not where it was." Patient reports 4/10 pain at arrival.    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 Referring diagnoses includes chronic pain. Patient reports pain is primarily affecting L UT region and radiating down full length of L upper limb. Pt has (-) upper limb EMG (study completed 04/16/21). Pt has pain currently along L paracervical/UT region and down her L arm. Patient reports numbness/tingling down to her L hand/fingers without specific distribution to particular digits of L hand. Med hx including RA, anxiety, depression, HTN. Patient reports that she had more R upper quarter pain prior to surgery; more on L side presently. Patient is s/p C6-7 TFESI (03/26/21) without resolution of symptoms. No relief with L shoulder subacromial injection. 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 Patient reports disturbed sleep with change in position relieving one shoulder and then aggravating opposite side. Pt denies diplopia, dysarthria, dysphagia, vertigo. Pt reports some post-operative nausea - none presently. Patient reports losing weight after surgery due to not being able to eat (25 pounds following surgery last July). "Pain feels like 100" in regard to NPRS at worst. Patient reports constant pain. Pt states she can reach with her R arm well  at this time. Primary complaint is left upper quarter; she has comorbid chronic low back pain; back pain is worsened with sitting on lawn mower, prolonged standing, repetitive lumbar flexion. Pt reports she has to sit to relieve low back pain when completing standing work for extended period. Patient reports burning down to her anterior thighs. (+) shopping cart sign. No lower body numbness/tingling.    Limitations Reading;Sitting;Standing;House hold activities;Lifting     Diagnostic tests MRI cervical and lumbar spine, MRI L shoulder, EMG L upper extremity (see Subjective in initial eval)    Pain Onset More than a month ago                  TREATMENT     Therapeutic Exercise - for improved soft tissue flexibility and extensibility as needed for ROM, improved strength as needed to improve performance of CKC activities/functional movements    Shoulder flexion AAROM with dowel; 1x8, 1x5, utilized elbow flexion to shorten lever arm prn (notable pain with end-range and return to neutral)   Posterior pelvic tilt; 2x10 Posterior pelvic tilt with alternating march; x10 alternating LLE/RLE Swiss ball seated marches; 2x10 alternating    Pulleys, flexion and abduction ; x 1 minute each   Seated Blue Tband rows;, on Blue SWB; 2x10 with verbal cueing and demo for scapular retraction and upright seated posture           *not today* Lower trunk rotations; hooklying; x10 each direction, alternating Shoulder ER AAROM with dowel; x15  Swiss ball pelvic tilt, Green SWB; Swiss ball march, Green SWB; Seated green tband rows 2 x 12 Reviewed technique for active median nerve glide within tolerated ROM Seated cervical retraction 1 x 10; patient reports no significant symptoms at rest in shoulder/arm, but moderate pain affecting lower cervical spine following repeated cervical retraction Forward yoga ball roll outs x 15 Standing green tband GH extension x 10; pain so discontinued  Supine bodyweight GH flexion x 10     MHP (unbilled) utilized during manual therapy and with initiation of pelvic tilts and supine marches along L shoulder in supine lying for analgesic effect and improved soft tissue extensibility and tolerance to shoulder motion; x 8 minutes     Manual Therapy - for symptom modulation, soft tissue sensitivity and mobility, joint mobility, ROM with pt in supine.   STM/ L upper trapezius, L supraspinatus L shoulder PROM flexion, abduction, and  ER      *not today* general lumbar traction in lying (D/C) Manual median nerve glide; modified ROM as needed due to brief increase in arm and elbow pain                 PT Short Term Goals - 06/11/21 1407       PT SHORT TERM GOAL #1   Title Pt will be independent with HEP in order to improve mobility and decrease pain in order to improve pain-free function at home and in community.    Baseline 05/07/21: Baseline HEP initiated.   06/11/21: Pt reports completing home exercises daily at this time, pt verbalizes understanding of exercises    Time 3    Period Weeks    Status Achieved    Target Date 05/28/21      PT SHORT TERM GOAL #2   Title Patient will have L shoulder AROM within 10 degrees of contralateral upper limb without reproduction of symptoms as needed for reaching, self-care,    Baseline 05/07/21: Shoulder AROM R/L Flexion 154/101, Abduction  161/97.  06/12/21: Shoulder AROM R/L Flexion 154/99, Abduction 170/110    Time 4    Period Weeks    Status On-going    Target Date 06/04/21               PT Long Term Goals - 06/11/21 1342       PT LONG TERM GOAL #1   Title Patient will demonstrate improved function as evidenced by a score of 53 on FOTO measure for full participation in activities at home and in the community.    Baseline 05/07/21: FOTO 34.   06/11/21: FOTO 47    Time 8    Period Weeks    Status On-going    Target Date 07/02/21      PT LONG TERM GOAL #2   Title Pt will decrease worst neck pain as reported on NPRS by at least 2 points in order to demonstrate clinically significant reduction in back pain.    Baseline 05/07/21: NPRS 10/10 at worst   06/11/21: pain at 5/10 at worst    Time 8    Period Weeks    Status Achieved    Target Date 07/02/21      PT LONG TERM GOAL #3   Title Patient will have MMT 4/5 or greater for all shoulder motions indicative of increased upper body strength as needed for assistance with bed mobility and performing functional  lifting tasks and carrying tasks in home    Baseline 05/07/21: MMT Shoulder R/L Flexion 4/4-, abduction 5/3+, ER 5/4-, IR 5/4.   06/11/21: MMT Shoulder R/L Flexion 4/4, abduction 5/4, ER 5/4-, IR 5/4    Time 8    Period Weeks    Status Partially Met    Target Date 07/02/21      PT LONG TERM GOAL #4   Title Patient will tolerate standing activity up to 1 hour without significant increase in NPRS as needed for completion of household work and errands in community    Baseline 05/07/21: significant difficulty performing upright activity in home.   06/11/21: pt tolerates standing up to 10 minutes    Time 8    Period Weeks    Status Not Met    Target Date 07/02/21                    Patient will benefit from skilled therapeutic intervention in order to improve the following deficits and impairments:     Visit Diagnosis: Cervicalgia  Left shoulder pain, unspecified chronicity  Radiculopathy, cervical region  Chronic bilateral low back pain with left-sided sciatica     Problem List Patient Active Problem List   Diagnosis Date Noted   Cervical radiculopathy 10/18/2020   Valentina Gu, PT, DPT #[id number]  Eilleen Kempf, PT 06/18/2021, 1:51 PM  [facility] [address] [place], [place], [address] Phone: [phone]   Fax:  [phone]  Name: Jillian Ward MRN: [id number] Date of Birth: February 04, 1966

## 2021-06-20 ENCOUNTER — Encounter: Admitting: Physical Therapy

## 2021-06-25 ENCOUNTER — Ambulatory Visit: Admitting: Physical Therapy

## 2021-06-25 ENCOUNTER — Other Ambulatory Visit: Payer: Self-pay

## 2021-06-25 ENCOUNTER — Encounter: Payer: Self-pay | Admitting: Physical Therapy

## 2021-06-25 DIAGNOSIS — M25512 Pain in left shoulder: Secondary | ICD-10-CM | POA: Diagnosis not present

## 2021-06-25 DIAGNOSIS — M542 Cervicalgia: Secondary | ICD-10-CM

## 2021-06-25 DIAGNOSIS — M5412 Radiculopathy, cervical region: Secondary | ICD-10-CM

## 2021-06-25 DIAGNOSIS — G8929 Other chronic pain: Secondary | ICD-10-CM

## 2021-06-25 NOTE — Therapy (Signed)
[place] ?[facility] ?[address] [place], [place], [address] ?Phone: [phone]   Fax:  [phone] ? ?Physical Therapy Treatment ? ?Patient Details  ?Name: Jillian Ward ?MRN: [id number] ?Date of Birth: December 05, 1965 ?Referring Provider (PT): Meade Maw, MD ? ? ?Encounter Date: 06/25/2021 ? ? PT End of Session - 06/27/21 0700   ? ? Visit Number 13   ? Number of Visits 17   ? Date for PT Re-Evaluation 07/02/21   ? Authorization Type [employer], VL based on medical necessity   ? PT Start Time 1330   ? PT Stop Time 4128   ? PT Time Calculation (min) 45 min   ? Activity Tolerance Patient limited by pain   ? Behavior During Therapy [facility] for tasks assessed/performed   ? ?  ?  ? ?  ? ? ? ?Past Medical History:  ?Diagnosis Date  ? Anxiety   ? 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 Depression   ? Dyspnea   ? Hypertension   ? Nodule of right lung   ? Rib fracture   ? ? ?Past Surgical History:  ?Procedure Laterality Date  ? ANTERIOR CERVICAL DECOMP/DISCECTOMY FUSION N/A 10/18/2020  ? Procedure: C4-6 ANTERIOR CERVICAL DECOMPRESSION/DISCECTOMY FUSION 2 LEVELS;  Surgeon: Meade Maw, MD;  Location: [facility] ORS;  Service: Neurosurgery;  Laterality: N/A;  schedule as 1st case  ? APPENDECTOMY    ? CESAREAN SECTION    ? COLONOSCOPY WITH PROPOFOL N/A 01/15/2016  ? Procedure: COLONOSCOPY WITH PROPOFOL;  Surgeon: Manya Silvas, MD;  Location: [facility] ENDOSCOPY;  Service: Endoscopy;  Laterality: N/A;  ? 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  ? 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 HERNIA REPAIR  2005  ? ABDOMINAL  ? HERNIA REPAIR    ? UMBILICAL  ? ? ?There were no vitals filed for this visit. ? ? Subjective Assessment - 06/27/21 0701   ? ? Subjective Patient reports 4/10 pain today. She reports hurting more over the last few days at 6/10. Patient  reports pain affecting her L arm along proximal 1/3 of humerus. Patient reports compliance with her HEP.   ? 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 Referring diagnoses includes chronic pain. Patient reports pain is primarily affecting L UT region and radiating down full length of L upper limb. Pt has (-) upper limb EMG (study completed 04/16/21). Pt has pain currently along L paracervical/UT region and down her L arm. Patient reports numbness/tingling down to her L hand/fingers without specific distribution to particular digits of L hand. Med hx including RA, anxiety, depression, HTN. Patient reports that she had more R upper quarter pain prior to surgery; more on L side presently. Patient is s/p C6-7 TFESI (03/26/21) without resolution of symptoms. No relief with L shoulder subacromial injection. 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 Patient reports disturbed sleep with change in position relieving one shoulder and then aggravating opposite side. Pt denies diplopia, dysarthria, dysphagia, vertigo. Pt reports some post-operative nausea - none presently. Patient reports losing weight after surgery due to not being able to eat (25 pounds following surgery last July). "Pain feels like 100" in regard to NPRS at worst. Patient reports constant pain. Pt states she can reach  with her R arm well at this time. Primary complaint is left upper quarter; she has comorbid chronic low back pain; back pain is worsened with sitting on lawn mower, prolonged standing, repetitive lumbar flexion. Pt reports she has to sit to relieve low back pain when completing standing work for extended period. Patient reports burning down to her anterior thighs. (+) shopping cart sign. No lower body numbness/tingling.   ? Limitations Reading;Sitting;Standing;House hold  activities;Lifting   ? Diagnostic tests MRI cervical and lumbar spine, MRI L shoulder, EMG L upper extremity (see Subjective in initial eval)   ? Pain Onset More than a month ago   ? ?  ?  ? ?  ? ? ? ? ? ? ?  ?TREATMENT ?  ?  ?Therapeutic Exercise - for improved soft tissue flexibility and extensibility as needed for ROM, improved strength as needed to improve performance of CKC activities/functional movements ?  ?Seated cervical retraction 1 x 10; patient reports no significant symptoms at rest in shoulder/arm following repeated cervical retraction, stating her arm is not bothering her without performing notable activity ? ?Pulleys, flexion and abduction ; x 1 minute each ? ?Upper body ergometer, 2 minutes forward, 2 minutes backward - for graded exposure to upper limb active motion, use of aerobics to mitigate symptoms related to central sensitization ? ?Shoulder flexion AAROM with dowel; 2x10, long-lever flexion  ?  ?Modified dying bug; x10 alternating ? ?Posterior pelvic tilt with bridges; 2x10 ?Swiss ball seated marches; 2x10 alternating - with careful guarding from therapist in case of significant loss of balance ?  ? ?Seated Blue Tband rows; on Blue SWB; 2x10 with verbal cueing and demo for scapular retraction and upright seated posture ?  ?  ?  ?  ?*not today* ?Posterior pelvic tilt with alternating march; x10 alternating LLE/RLE ?Posterior pelvic tilt; 2x10 ?Lower trunk rotations; hooklying; x10 each direction, alternating ?Shoulder ER AAROM with dowel; x15  ?Swiss ball pelvic tilt, Green SWB; ?Swiss ball march, Green SWB; ?Seated green tband rows 2 x 12 ?Reviewed technique for active median nerve glide within tolerated ROM ?Forward yoga ball roll outs x 15 ?Standing green tband GH extension x 10; pain so discontinued  ?Supine bodyweight GH flexion x 10 ?  ?  ?  ?  ? ? ? ? ? PT Short Term Goals - 06/11/21 1407   ? ?  ? PT SHORT TERM GOAL #1  ? Title Pt will be independent with HEP in order to improve  mobility and decrease pain in order to improve pain-free function at home and in community.   ? Baseline 05/07/21: Baseline HEP initiated.   06/11/21: Pt reports completing home exercises daily at this time, pt verbalizes understanding of exercises   ? Time 3   ? Period Weeks   ? Status Achieved   ? Target Date 05/28/21   ?  ? PT SHORT TERM GOAL #2  ? Title Patient will have L shoulder AROM within 10 degrees of contralateral upper limb without reproduction of symptoms as needed for reaching, self-care,   ? Baseline 05/07/21: Shoulder AROM R/L Flexion 154/101, Abduction 161/97.  06/12/21: Shoulder AROM R/L Flexion 154/99, Abduction 170/110   ? Time 4   ? Period Weeks   ? Status On-going   ? Target Date 06/04/21   ? ?  ?  ? ?  ? ? ? ? PT Long Term Goals - 06/11/21 1342   ? ?  ? PT LONG TERM GOAL #1  ?  Title Patient will demonstrate improved function as evidenced by a score of 53 on FOTO measure for full participation in activities at home and in the community.   ? Baseline 05/07/21: FOTO 34.   06/11/21: FOTO 47   ? Time 8   ? Period Weeks   ? Status On-going   ? Target Date 07/02/21   ?  ? PT LONG TERM GOAL #2  ? Title Pt will decrease worst neck pain as reported on NPRS by at least 2 points in order to demonstrate clinically significant reduction in back pain.   ? Baseline 05/07/21: NPRS 10/10 at worst   06/11/21: pain at 5/10 at worst   ? Time 8   ? Period Weeks   ? Status Achieved   ? Target Date 07/02/21   ?  ? PT LONG TERM GOAL #3  ? Title Patient will have MMT 4/5 or greater for all shoulder motions indicative of increased upper body strength as needed for assistance with bed mobility and performing functional lifting tasks and carrying tasks in home   ? Baseline 05/07/21: MMT Shoulder R/L Flexion 4/4-, abduction 5/3+, ER 5/4-, IR 5/4.   06/11/21: MMT Shoulder R/L Flexion 4/4, abduction 5/4, ER 5/4-, IR 5/4   ? Time 8   ? Period Weeks   ? Status Partially Met   ? Target Date 07/02/21   ?  ? PT LONG TERM GOAL #4  ? Title  Patient will tolerate standing activity up to 1 hour without significant increase in NPRS as needed for completion of household work and errands in community   ? Baseline 05/07/21: significant difficulty performing upr

## 2021-06-27 ENCOUNTER — Other Ambulatory Visit: Payer: Self-pay

## 2021-06-27 ENCOUNTER — Ambulatory Visit: Admitting: Physical Therapy

## 2021-06-27 DIAGNOSIS — M5412 Radiculopathy, cervical region: Secondary | ICD-10-CM

## 2021-06-27 DIAGNOSIS — G8929 Other chronic pain: Secondary | ICD-10-CM

## 2021-06-27 DIAGNOSIS — M25512 Pain in left shoulder: Secondary | ICD-10-CM | POA: Diagnosis not present

## 2021-06-27 DIAGNOSIS — M542 Cervicalgia: Secondary | ICD-10-CM

## 2021-06-27 NOTE — Therapy (Signed)
[place] ?[facility] ?[address] [place], [place], [address] ?Phone: [phone]   Fax:  [phone] ? ?Physical Therapy Treatment ? ?Patient Details  ?Name: Jillian Ward ?MRN: [id number] ?Date of Birth: 1965/07/01 ?Referring Provider (PT): Venetia Night, MD ? ? ?Encounter Date: 06/27/2021 ? ? PT End of Session - 06/30/21 6606   ? ? Visit Number 14   ? Number of Visits 17   ? Date for PT Re-Evaluation 07/02/21   ? Authorization Type [employer], VL based on medical necessity   ? PT Start Time 1330   ? PT Stop Time 1415   ? PT Time Calculation (min) 45 min   ? Activity Tolerance Patient limited by pain   ? Behavior During Therapy [facility] for tasks assessed/performed   ? ?  ?  ? ?  ? ? ?Past Medical History:  ?Diagnosis Date  ? Anxiety   ? 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 Depression   ? Dyspnea   ? Hypertension   ? Nodule of right lung   ? Rib fracture   ? ? ?Past Surgical History:  ?Procedure Laterality Date  ? ANTERIOR CERVICAL DECOMP/DISCECTOMY FUSION N/A 10/18/2020  ? Procedure: C4-6 ANTERIOR CERVICAL DECOMPRESSION/DISCECTOMY FUSION 2 LEVELS;  Surgeon: Venetia Night, MD;  Location: [facility] ORS;  Service: Neurosurgery;  Laterality: N/A;  schedule as 1st case  ? APPENDECTOMY    ? CESAREAN SECTION    ? COLONOSCOPY WITH PROPOFOL N/A 01/15/2016  ? Procedure: COLONOSCOPY WITH PROPOFOL;  Surgeon: Scot Jun, MD;  Location: [facility] ENDOSCOPY;  Service: Endoscopy;  Laterality: N/A;  ? 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  ? 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 HERNIA REPAIR  2005  ? ABDOMINAL  ? HERNIA REPAIR    ? UMBILICAL  ? ? ?There were no vitals filed for this visit. ? ? Subjective Assessment - 06/30/21 0831   ? ? Subjective Patient reports no significant recent increase in NPRS. She has around 4/10 pain in L upper limb at  arrival to PT. Patient feels that she has done well with recent intervention focused on active intervention/graded exposure. Patient reports completing work in her yard this past weekend and having delayed onset of throbbing in L upper limb that was resolved by the following day. Pt is compliant with HEP.   ? 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 Referring diagnoses includes chronic pain. Patient reports pain is primarily affecting L UT region and radiating down full length of L upper limb. Pt has (-) upper limb EMG (study completed 04/16/21). Pt has pain currently along L paracervical/UT region and down her L arm. Patient reports numbness/tingling down to her L hand/fingers without specific distribution to particular digits of L hand. Med hx including RA, anxiety, depression, HTN. Patient reports that she had more R upper quarter pain prior to surgery; more on L side presently. Patient is s/p C6-7 TFESI (03/26/21) without resolution of symptoms. No relief with L shoulder subacromial injection. 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 Patient reports disturbed sleep with change in position relieving one shoulder and then aggravating opposite side. Pt denies diplopia, dysarthria, dysphagia, vertigo. Pt reports some post-operative nausea - none presently. Patient reports losing weight after  surgery due to not being able to eat (25 pounds following surgery last July). "Pain feels like 100" in regard to NPRS at worst. Patient reports constant pain. Pt states she can reach with her R arm well at this time. Primary complaint is left upper quarter; she has comorbid chronic low back pain; back pain is worsened with sitting on lawn mower, prolonged standing, repetitive lumbar flexion. Pt reports she has to sit to relieve low back pain when  completing standing work for extended period. Patient reports burning down to her anterior thighs. (+) shopping cart sign. No lower body numbness/tingling.   ? Limitations Reading;Sitting;Standing;House hold activities;Lifting   ? Diagnostic tests MRI cervical and lumbar spine, MRI L shoulder, EMG L upper extremity (see Subjective in initial eval)   ? Pain Onset More than a month ago   ? ?  ?  ? ?  ? ? ? ?  ?TREATMENT ?  ?  ?Therapeutic Exercise - for improved soft tissue flexibility and extensibility as needed for ROM, improved strength as needed to improve performance of CKC activities/functional movements, flexion-bias exercise for lumbar spine with lumbar paraspinal/truncal isometrics for analgesic effect and improved tolerance to moderate loading ?  ?Upper body ergometer, 2 minutes forward, 2 minutes backward - for graded exposure to upper limb active motion, use of aerobics to mitigate symptoms related to central sensitization ? ?  ?Shoulder AAROM with dowel, external rotation; 2x10  ? ?Shoulder flexion LUE in supine; 1-lb Dbell; 1x15  ? ?Modified dying bug; 2x10 alternating ?  ?Posterior pelvic tilt with bridges; 2x10 ? ?Foam roll bilateral shoulder elevation; x12; heavy verbal cueing, demonstration, and tactile cueing for correct technique ? ?Swiss ball seated marches; 2x10 alternating - with careful guarding from therapist in case of significant loss of balance ?  ?  ?Seated Blue Tband rows; on Blue SWB; 2x10 with verbal cueing and demo for scapular retraction and upright seated posture ?  ?  ?  ?  ?*not today* ?Shoulder flexion AAROM with dowel; 2x10, long-lever flexion  ?Pulleys, flexion and abduction ; x 1 minute each ?Seated cervical retraction 1 x 10; patient reports no significant symptoms at rest in shoulder/arm following repeated cervical retraction, stating her arm is not bothering her without performing notable activity ?Posterior pelvic tilt with alternating march; x10 alternating  LLE/RLE ?Posterior pelvic tilt; 2x10 ?Lower trunk rotations; hooklying; x10 each direction, alternating ?Shoulder ER AAROM with dowel; x15  ?Swiss ball pelvic tilt, Green SWB; ?Swiss ball march, Green SWB; ?Seated green tband rows 2 x 12 ?Reviewed technique for active median nerve glide within tolerated ROM ?Forward yoga ball roll outs x 15 ?Standing green tband GH extension x 10; pain so discontinued  ?Supine bodyweight GH flexion x 10 ?  ?  ?  ?  ?  ?  ? ? ? ? ? ? PT Short Term Goals - 06/11/21 1407   ? ?  ? PT SHORT TERM GOAL #1  ? Title Pt will be independent with HEP in order to improve mobility and decrease pain in order to improve pain-free function at home and in community.   ? Baseline 05/07/21: Baseline HEP initiated.   06/11/21: Pt reports completing home exercises daily at this time, pt verbalizes understanding of exercises   ? Time 3   ? Period Weeks   ? Status Achieved   ? Target Date 05/28/21   ?  ? PT SHORT TERM GOAL #2  ? Title Patient will have L shoulder AROM within  10 degrees of contralateral upper limb without reproduction of symptoms as needed for reaching, self-care,   ? Baseline 05/07/21: Shoulder AROM R/L Flexion 154/101, Abduction 161/97.  06/12/21: Shoulder AROM R/L Flexion 154/99, Abduction 170/110   ? Time 4   ? Period Weeks   ? Status On-going   ? Target Date 06/04/21   ? ?  ?  ? ?  ? ? ? ? PT Long Term Goals - 06/11/21 1342   ? ?  ? PT LONG TERM GOAL #1  ? Title Patient will demonstrate improved function as evidenced by a score of 53 on FOTO measure for full participation in activities at home and in the community.   ? Baseline 05/07/21: FOTO 34.   06/11/21: FOTO 47   ? Time 8   ? Period Weeks   ? Status On-going   ? Target Date 07/02/21   ?  ? PT LONG TERM GOAL #2  ? Title Pt will decrease worst neck pain as reported on NPRS by at least 2 points in order to demonstrate clinically significant reduction in back pain.   ? Baseline 05/07/21: NPRS 10/10 at worst   06/11/21: pain at 5/10 at worst   ?  Time 8   ? Period Weeks   ? Status Achieved   ? Target Date 07/02/21   ?  ? PT LONG TERM GOAL #3  ? Title Patient will have MMT 4/5 or greater for all shoulder motions indicative of increased upper body strength as needed for a

## 2021-06-30 ENCOUNTER — Encounter: Payer: Self-pay | Admitting: Physical Therapy

## 2021-07-02 ENCOUNTER — Other Ambulatory Visit: Payer: Self-pay

## 2021-07-02 ENCOUNTER — Ambulatory Visit: Admitting: Physical Therapy

## 2021-07-02 DIAGNOSIS — G8929 Other chronic pain: Secondary | ICD-10-CM

## 2021-07-02 DIAGNOSIS — M25512 Pain in left shoulder: Secondary | ICD-10-CM

## 2021-07-02 DIAGNOSIS — M542 Cervicalgia: Secondary | ICD-10-CM

## 2021-07-02 DIAGNOSIS — M5412 Radiculopathy, cervical region: Secondary | ICD-10-CM

## 2021-07-02 NOTE — Therapy (Signed)
?OUTPATIENT PHYSICAL THERAPY TREATMENT NOTE ? ? ?Patient Name: Jillian Ward ?MRN: [id number] ?DOB:09-Jun-1965, 56 y.o., female ?Today's Date: 07/02/2021 ? ?PCP: Sallee Lange, NP ?REFERRING PROVIDER: Meade Maw, MD ? ? PT End of Session - 07/02/21 1505   ? ? Visit Number 15   ? Number of Visits 17   ? Date for PT Re-Evaluation 07/02/21   ? Authorization Type [employer], VL based on medical necessity   ? PT Start Time 1500   ? PT Stop Time 1543   ? PT Time Calculation (min) 43 min   ? Activity Tolerance Patient limited by pain   ? Behavior During Therapy [facility] for tasks assessed/performed   ? ?  ?  ? ?  ? ? ? ?Past Medical History:  ?Diagnosis Date  ? Anxiety   ? 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 Depression   ? Dyspnea   ? Hypertension   ? Nodule of right lung   ? Rib fracture   ? ?Past Surgical History:  ?Procedure Laterality Date  ? ANTERIOR CERVICAL DECOMP/DISCECTOMY FUSION N/A 10/18/2020  ? Procedure: C4-6 ANTERIOR CERVICAL DECOMPRESSION/DISCECTOMY FUSION 2 LEVELS;  Surgeon: Meade Maw, MD;  Location: [facility] ORS;  Service: Neurosurgery;  Laterality: N/A;  schedule as 1st case  ? APPENDECTOMY    ? CESAREAN SECTION    ? COLONOSCOPY WITH PROPOFOL N/A 01/15/2016  ? Procedure: COLONOSCOPY WITH PROPOFOL;  Surgeon: Manya Silvas, MD;  Location: [facility] ENDOSCOPY;  Service: Endoscopy;  Laterality: N/A;  ? 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  ? 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 HERNIA REPAIR  2005  ? ABDOMINAL  ? HERNIA REPAIR    ? UMBILICAL  ? ?Patient Active Problem List  ? Diagnosis Date Noted  ? Cervical radiculopathy 10/18/2020  ? ? ?REFERRING DIAG: M54.12 (ICD-10-CM) - Radiculopathy, cervical region M54.42 (ICD-10-CM) - Lumbago with sciatica, left side M54.41 (ICD-10-CM) - Lumbago with sciatica, right side G89.29 (ICD-10-CM) - Other chronic pain   ? ?THERAPY DIAG:  ?Cervicalgia ? ?Left shoulder pain, unspecified chronicity ? ?Radiculopathy, cervical region ? ?Chronic bilateral low back pain with left-sided sciatica ? ? ?PERTINENT HISTORY: Patient is a 56 year old female s/p C4-6 ACDF 10/18/20 with referral for cervical radiculopathy and lumbago with sciatica affecting bilateral lower extremities. Referring diagnoses includes chronic pain. Patient reports pain is primarily affecting L UT region and radiating down full length of L upper limb. Pt has (-) upper limb EMG (study completed 04/16/21). Pt has pain currently along L paracervical/UT region and down her L arm. Patient reports numbness/tingling down to her L hand/fingers without specific distribution to particular digits of L hand. Med hx including RA, anxiety, depression, HTN. Patient reports that she had more R upper quarter pain prior to surgery; more on L side presently. Patient is s/p C6-7 TFESI (03/26/21) without resolution of symptoms. No relief with L shoulder subacromial injection. 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 Patient reports disturbed sleep with change in position relieving one shoulder and then aggravating opposite side. Pt denies diplopia, dysarthria, dysphagia, vertigo. Pt reports some post-operative nausea - none presently. Patient reports losing weight after surgery due to not being able to eat (25 pounds following surgery last July). "Pain feels like 100" in regard to NPRS at worst. Patient reports constant pain. Pt states she can reach with her R arm well  at this time. Primary complaint is left upper quarter; she has comorbid chronic low back pain; back pain is worsened with sitting on lawn mower, prolonged standing, repetitive lumbar flexion. Pt reports she has to sit to relieve low back pain when completing standing work for extended period. Patient reports burning down to her anterior thighs. (+) shopping cart sign. No lower body  numbness/tingling.  ? ? ?PRECAUTIONS: None ? ? ?SUBJECTIVE: Patient reports 4-5/10 pain at arrival to PT. She reports completing yard work over the weekend for about 2-3 hours, experiencing bilateral shoulder pain with L shoulder/arm definitely hurting worse per patient. She also reports soreness along rhomboids/middle trapezius. She feels that her activity tolerance and ability to functionally use her L upper limb has notably improved, though she is still using Tylenol at varying frequencies to manage her symptoms (increased use of Tylenol following aforementioned yard work). ? ?PAIN:  ?Are you having pain? Yes: NPRS scale: 4-5/10 ?Pain location: L shoulder, proximal arm with radiation down to lateral elbow ?Pain description:   ?Aggravating factors:   ?Relieving factors:   ? ? ? ? ?TODAY'S TREATMENT:  ?  ?  ?TREATMENT ?  ?  ?Therapeutic Exercise - for improved soft tissue flexibility and extensibility as needed for ROM, improved strength as needed to improve performance of CKC activities/functional movements, flexion-bias exercise for lumbar spine with lumbar paraspinal/truncal isometrics for analgesic effect and improved tolerance to moderate loading ?  ?Upper body ergometer, 2 minutes forward, 2 minutes backward - for graded exposure to upper limb active motion, use of aerobics to mitigate symptoms related to central sensitization ?  ?  ?Shoulder AAROM with dowel, external rotation; 2x10  ?  ?Shoulder flexion LUE in supine; 2-lb Dbell 1x10, 1-lb Dbell 1x8 (stopped due to pain)  ? ?Wall slide, flexion; towel on wall; x10, 5 sec hold  ?  ?Modified dying bug; 2x10 alternating [heavy verbal and tactile cueing, demonstration to move opposite upper/lower limbs reciprocally] ?  ?Swiss ball bridge; Green ball; 2x10 ?  ?Foam roll bilateral shoulder elevation; x12; heavy verbal cueing, demonstration, and tactile cueing for correct technique ?  ?Swiss ball seated marches; 2x10 alternating - with careful guarding from  therapist in case of significant loss of balance ?  ?  ?Seated Blue Tband rows; on Blue SWB; 2x10 with verbal cueing and demo for scapular retraction and upright seated posture ?  ?  ?  ?  ?*not today* ?Shoulder flexion AAROM with dowel; 2x10, long-lever flexion  ?Pulleys, flexion and abduction ; x 1 minute each ?Seated cervical retraction 1 x 10; patient reports no significant symptoms at rest in shoulder/arm following repeated cervical retraction, stating her arm is not bothering her without performing notable activity ?Posterior pelvic tilt with alternating march; x10 alternating LLE/RLE ?Posterior pelvic tilt; 2x10 ?Lower trunk rotations; hooklying; x10 each direction, alternating ?Shoulder ER AAROM with dowel; x15  ?Swiss ball pelvic tilt, Green SWB; ?Swiss ball march, Green SWB; ?Seated green tband rows 2 x 12 ?Reviewed technique for active median nerve glide within tolerated ROM ?Forward yoga ball roll outs x 15 ?Standing green tband GH extension x 10; pain so discontinued  ?Supine bodyweight GH flexion x 10 ?  ? ? ? ? ? ?OBJECTIVE (measures taken are from most recent progress note unless otherwise stated) ?  ?Posture ?Significantly guarded posture of L upper limb, scapulae elevated and anteriorly tilted. Mild rounded shoulders ?  ?Palpation ?Tenderness to palpation along L upper trapezius, L supraspinatus, L infraspinatus, L middle deltoid ?  ?  Gait ?Unremarkable ?  ?  ?Strength ?R/L ?4/4* Shoulder flexion (anterior deltoid/pec major/coracobrachialis, axillary n. (C5/6) and musculocutaneous n. (C5-7)) ?5/4* Shoulder abduction (deltoid/supraspinatus, axillary/suprascapular n, C5) ?5/4-* Shoulder external rotation (infraspinatus/teres minor) ?5/4* Shoulder internal rotation (subcapularis/lats/pec major) ?5/5* Elbow flexion (biceps brachii, brachialis, brachioradialis, musculoskeletal n, C5/6) ?5/4* Elbow extension (triceps, radial n, C7) ?5/5 Wrist Extension (C6/7) ?5/5 Finger adduction (interossei, ulnar n,  T1) ?  ?R/L  ?5/5 Hip extension ?  ?  ?AROM ?R/L ?45 Cervical Flexion ?45 Cervical Extension ?35/37 Cervical Lateral Flexion ?60/70 Cervical Rotation ?*Indicates pain, overpressure performed unless otherwise indicat

## 2021-07-03 ENCOUNTER — Encounter: Payer: Self-pay | Admitting: Physical Therapy

## 2021-07-04 ENCOUNTER — Encounter: Payer: Self-pay | Admitting: Physical Therapy

## 2021-07-04 ENCOUNTER — Ambulatory Visit: Admitting: Physical Therapy

## 2021-07-04 DIAGNOSIS — M25512 Pain in left shoulder: Secondary | ICD-10-CM | POA: Diagnosis not present

## 2021-07-04 DIAGNOSIS — M542 Cervicalgia: Secondary | ICD-10-CM

## 2021-07-04 DIAGNOSIS — M5412 Radiculopathy, cervical region: Secondary | ICD-10-CM

## 2021-07-04 DIAGNOSIS — G8929 Other chronic pain: Secondary | ICD-10-CM

## 2021-07-04 NOTE — Therapy (Signed)
?OUTPATIENT PHYSICAL THERAPY TREATMENT NOTE ? ? ?Patient Name: Jillian Ward ?MRN: [id number] ?DOB:[birth year], 57 y.o., female ?Today's Date: 07/06/2021 ? ?PCP: Myrene Buddy, NP ?REFERRING PROVIDER: Venetia Night, MD ? ? PT End of Session - 07/06/21 1657   ? ? Visit Number 16   ? Number of Visits 18   ? Date for PT Re-Evaluation 07/11/21   ? Authorization Type [employer], VL based on medical necessity   ? PT Start Time 1330   ? PT Stop Time 1415   ? PT Time Calculation (min) 45 min   ? Activity Tolerance Patient limited by pain   ? Behavior During Therapy [facility] for tasks assessed/performed   ? ?  ?  ? ?  ? ? ?Past Medical History:  ?Diagnosis Date  ? Anxiety   ? 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 Depression   ? Dyspnea   ? Hypertension   ? Nodule of right lung   ? Rib fracture   ? ?Past Surgical History:  ?Procedure Laterality Date  ? ANTERIOR CERVICAL DECOMP/DISCECTOMY FUSION N/A 10/18/2020  ? Procedure: C4-6 ANTERIOR CERVICAL DECOMPRESSION/DISCECTOMY FUSION 2 LEVELS;  Surgeon: Venetia Night, MD;  Location: [facility] ORS;  Service: Neurosurgery;  Laterality: N/A;  schedule as 1st case  ? APPENDECTOMY    ? CESAREAN SECTION    ? COLONOSCOPY WITH PROPOFOL N/A 01/15/2016  ? Procedure: COLONOSCOPY WITH PROPOFOL;  Surgeon: Scot Jun, MD;  Location: [facility] ENDOSCOPY;  Service: Endoscopy;  Laterality: N/A;  ? 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  ? 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 HERNIA REPAIR  2005  ? ABDOMINAL  ? HERNIA REPAIR    ? UMBILICAL  ? ?Patient Active Problem List  ? Diagnosis Date Noted  ? Cervical radiculopathy 10/18/2020  ? ? ?  ?REFERRING DIAG: M54.12 (ICD-10-CM) - Radiculopathy, cervical region M54.42 (ICD-10-CM) - Lumbago with sciatica, left side M54.41 (ICD-10-CM) - Lumbago with sciatica, right side G89.29 (ICD-10-CM) - Other chronic pain   ?  ?THERAPY DIAG:  ?Cervicalgia ?  ?Left shoulder pain, unspecified chronicity ?  ?Radiculopathy, cervical region ?  ?Chronic bilateral low back pain with left-sided sciatica ?  ?  ?PERTINENT HISTORY: Patient is a 56 year old female s/p C4-6 ACDF 10/18/20 with referral for cervical radiculopathy and lumbago with sciatica affecting bilateral lower extremities. Referring diagnoses includes chronic pain. Patient reports pain is primarily affecting L UT region and radiating down full length of L upper limb. Pt has (-) upper limb EMG (study completed 04/16/21). Pt has pain currently along L paracervical/UT region and down her L arm. Patient reports numbness/tingling down to her L hand/fingers without specific distribution to particular digits of L hand. Med hx including RA, anxiety, depression, HTN. Patient reports that she had more R upper quarter pain prior to surgery; more on L side presently. Patient is s/p C6-7 TFESI (03/26/21) without resolution of symptoms. No relief with L shoulder subacromial injection. 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 Patient reports disturbed sleep with change in position relieving one shoulder and then aggravating opposite side. Pt denies diplopia, dysarthria, dysphagia, vertigo. Pt reports some post-operative nausea - none presently. Patient reports losing weight after surgery due to not being able to eat (25 pounds following surgery last July). "Pain feels like 100" in regard to NPRS at worst. Patient reports constant pain. Pt states she  can reach with her R arm well at this time. Primary complaint is left upper quarter; she has comorbid chronic low back pain; back pain is worsened with sitting on lawn mower, prolonged standing, repetitive lumbar flexion. Pt reports she has to sit to relieve low back pain when completing standing work for extended period. Patient reports burning down to her anterior thighs. (+) shopping cart sign. No lower body  numbness/tingling.  ?  ?  ?PRECAUTIONS: None ?  ?  ?SUBJECTIVE: Patient reports completing yard work yesterday, push mowing up to 1 acre with shoulder pain during this activity; however, she reports managing pain with Tylenol and baseline pain at arrival to PT this afternoon. She reports intermittent paresthesias in distal digits on either side during mowing - not reported presently.  ?  ?PAIN:  ?Are you having pain? Yes: NPRS scale: 4/10 ?Pain location: L shoulder/arm ?Pain description:   ?Aggravating factors:   ?Relieving factors:   ?  ?  ?  ?  ?TODAY'S TREATMENT:  ?  ?  ?TREATMENT ?  ?  ?Therapeutic Exercise - for improved soft tissue flexibility and extensibility as needed for ROM, improved strength as needed to improve performance of CKC activities/functional movements, flexion-bias exercise for lumbar spine with lumbar paraspinal/truncal isometrics for analgesic effect and improved tolerance to moderate loading ?  ?Upper body ergometer, 2 minutes forward, 2 minutes backward - for graded exposure to upper limb active motion, use of aerobics to mitigate symptoms related to central sensitization ?  ?  ?Shoulder AAROM with dowel, external rotation; 2x10  ?  ?Shoulder flexion LUE in supine; 1-lb Dbell; 2x12 ?  ?Wall slide, flexion; towel on wall; x10, 5 sec hold  ?  ?Modified dying bug; 2x10 alternating [heavy verbal and tactile cueing, demonstration to move opposite upper/lower limbs reciprocally] ?  ?Swiss ball seated marches; 2x10 alternating - with careful guarding from therapist in case of significant loss of balance ?  ?Seated Blue Tband rows; on Blue SWB; 2x10 with verbal cueing and demo for scapular retraction and upright seated posture ?  ?Single-arm shoulder extension with shoulder flexion eccentric to increase ROM; x10 - stopped secondary to pain  ?  ?Seated W with Blue Tband; verbal cueing and demo for upright posture; 2x10  ? ?Patient education: Discussed contextual factors related to persistent pain  and significant association between chronic smoking and post-surgical pain as well as chronic pain  ? ?  ?*not today* ?Swiss ball bridge; Green ball; 2x10 ?Foam roll bilateral shoulder elevation; x12; heavy verbal cueing, demonstration, and tactile cueing for correct technique ?Shoulder flexion AAROM with dowel; 2x10, long-lever flexion  ?Pulleys, flexion and abduction ; x 1 minute each ?Seated cervical retraction 1 x 10; patient reports no significant symptoms at rest in shoulder/arm following repeated cervical retraction, stating her arm is not bothering her without performing notable activity ?Posterior pelvic tilt with alternating march; x10 alternating LLE/RLE ?Posterior pelvic tilt; 2x10 ?Lower trunk rotations; hooklying; x10 each direction, alternating ?Shoulder ER AAROM with dowel; x15  ?Swiss ball pelvic tilt, Green SWB; ?Swiss ball march, Green SWB; ?Seated green tband rows 2 x 12 ?Reviewed technique for active median nerve glide within tolerated ROM ?Forward yoga ball roll outs x 15 ?Standing green tband GH extension x 10; pain so discontinued  ?Supine bodyweight GH flexion x 10 ?  ?  ?  ?  ?  ?  ?OBJECTIVE (measures taken are from most recent progress note unless otherwise stated) ?  ?Posture ?Significantly guarded posture of  L upper limb, scapulae elevated and anteriorly tilted. Mild rounded shoulders ?  ?Palpation ?Tenderness to palpation along L upper trapezius, L supraspinatus, L infraspinatus, L middle deltoid ?  ?Gait ?Unremarkable ?  ?  ?Strength ?R/L ?4/4* Shoulder flexion (anterior deltoid/pec major/coracobrachialis, axillary n. (C5/6) and musculocutaneous n. (C5-7)) ?5/4* Shoulder abduction (deltoid/supraspinatus, axillary/suprascapular n, C5) ?5/4-* Shoulder external rotation (infraspinatus/teres minor) ?5/4* Shoulder internal rotation (subcapularis/lats/pec major) ?5/5* Elbow flexion (biceps brachii, brachialis, brachioradialis, musculoskeletal n, C5/6) ?5/4* Elbow extension (triceps, radial  n, C7) ?5/5 Wrist Extension (C6/7) ?5/5 Finger adduction (interossei, ulnar n, T1) ?  ?R/L  ?5/5 Hip extension ?  ?  ?AROM ?R/L ?45 Cervical Flexion ?45 Cervical Extension ?35/37 Cervical Lateral Flexi

## 2021-07-09 ENCOUNTER — Ambulatory Visit: Attending: Neurosurgery | Admitting: Physical Therapy

## 2021-07-09 DIAGNOSIS — M542 Cervicalgia: Secondary | ICD-10-CM | POA: Diagnosis present

## 2021-07-09 DIAGNOSIS — M25512 Pain in left shoulder: Secondary | ICD-10-CM

## 2021-07-09 DIAGNOSIS — G8929 Other chronic pain: Secondary | ICD-10-CM | POA: Diagnosis present

## 2021-07-09 DIAGNOSIS — M5412 Radiculopathy, cervical region: Secondary | ICD-10-CM

## 2021-07-09 DIAGNOSIS — M5442 Lumbago with sciatica, left side: Secondary | ICD-10-CM | POA: Insufficient documentation

## 2021-07-09 NOTE — Therapy (Signed)
?OUTPATIENT PHYSICAL THERAPY TREATMENT NOTE ? ? ?Patient Name: Jillian Ward ?MRN: [id number] ?DOB:06/01/1965, 56 y.o., female ?Today's Date: 07/11/2021 ? ?PCP: Sallee Lange, NP ?REFERRING PROVIDER: Meade Maw, MD ? ? PT End of Session - 07/11/21 0753   ? ? Visit Number 17   ? Number of Visits 18   ? Date for PT Re-Evaluation 07/11/21   ? Authorization Type [employer], VL based on medical necessity   ? PT Start Time 1331   ? PT Stop Time 8299   ? PT Time Calculation (min) 44 min   ? Activity Tolerance Patient limited by pain   ? Behavior During Therapy [facility] for tasks assessed/performed   ? ?  ?  ? ?  ? ? ?Past Medical History:  ?Diagnosis Date  ? Anxiety   ? 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 Depression   ? Dyspnea   ? Hypertension   ? Nodule of right lung   ? Rib fracture   ? ?Past Surgical History:  ?Procedure Laterality Date  ? ANTERIOR CERVICAL DECOMP/DISCECTOMY FUSION N/A 10/18/2020  ? Procedure: C4-6 ANTERIOR CERVICAL DECOMPRESSION/DISCECTOMY FUSION 2 LEVELS;  Surgeon: Meade Maw, MD;  Location: [facility] ORS;  Service: Neurosurgery;  Laterality: N/A;  schedule as 1st case  ? APPENDECTOMY    ? CESAREAN SECTION    ? COLONOSCOPY WITH PROPOFOL N/A 01/15/2016  ? Procedure: COLONOSCOPY WITH PROPOFOL;  Surgeon: Manya Silvas, MD;  Location: [facility] ENDOSCOPY;  Service: Endoscopy;  Laterality: N/A;  ? 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  ? 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 HERNIA REPAIR  2005  ? ABDOMINAL  ? HERNIA REPAIR    ? UMBILICAL  ? ?Patient Active Problem List  ? Diagnosis Date Noted  ? Cervical radiculopathy 10/18/2020  ? ? ? ? ?  ?  ?REFERRING DIAG: M54.12 (ICD-10-CM) - Radiculopathy, cervical region M54.42 (ICD-10-CM) - Lumbago with sciatica, left side M54.41 (ICD-10-CM) - Lumbago with sciatica, right side G89.29 (ICD-10-CM) - Other chronic  pain  ?  ?THERAPY DIAG:  ?Cervicalgia ?  ?Left shoulder pain, unspecified chronicity ?  ?Radiculopathy, cervical region ?  ?Chronic bilateral low back pain with left-sided sciatica ?  ?  ?PERTINENT HISTORY: Patient is a 56 year old female s/p C4-6 ACDF 10/18/20 with referral for cervical radiculopathy and lumbago with sciatica affecting bilateral lower extremities. Referring diagnoses includes chronic pain. Patient reports pain is primarily affecting L UT region and radiating down full length of L upper limb. Pt has (-) upper limb EMG (study completed 04/16/21). Pt has pain currently along L paracervical/UT region and down her L arm. Patient reports numbness/tingling down to her L hand/fingers without specific distribution to particular digits of L hand. Med hx including RA, anxiety, depression, HTN. Patient reports that she had more R upper quarter pain prior to surgery; more on L side presently. Patient is s/p C6-7 TFESI (03/26/21) without resolution of symptoms. No relief with L shoulder subacromial injection. 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 Patient reports disturbed sleep with change in position relieving one shoulder and then aggravating opposite side. Pt denies diplopia, dysarthria, dysphagia, vertigo. Pt reports some post-operative nausea - none presently. Patient reports losing weight after surgery due to not being able to eat (25 pounds following surgery last July). "Pain feels like 100" in regard to NPRS at worst. Patient reports constant  pain. Pt states she can reach with her R arm well at this time. Primary complaint is left upper quarter; she has comorbid chronic low back pain; back pain is worsened with sitting on lawn mower, prolonged standing, repetitive lumbar flexion. Pt reports she has to sit to relieve low back pain when completing standing work for extended period. Patient reports burning down to her anterior thighs. (+) shopping cart sign. No lower  body numbness/tingling.  ?  ?  ?PRECAUTIONS: None ?  ?  ?SUBJECTIVE: Patient reports hurting more than usual over the last few days without known specific cause. Patient reports pain after last session that had regressed to her usual pain by Friday. Patient reports using Tylenol more often, about 3x/day versus just at night. Patient reports getting adequate sleep quality, less tossing and turning.  ?  ?PAIN:  ?Are you having pain? Yes: NPRS scale: 5/10 ?Pain location: L shoulder/arm, periscapular  ?  ?  ? ?OBJECTIVE FINDINGS ? ?AROM ?Cervical flexion:  75%* pain L paracervical  ?Cervical extension: 75% ?Lateral flexion: Right 75%* (pull) , Left 75% ?Cervical rotation: Right WNL*, Left WNL* ?(Pain on R paracervical with bilateral rotation)  ?*Indicates pain ? ?Shoulder flexion: R 160 deg, L 120 deg  ?  ? ? ?  ?TODAY'S TREATMENT:  ?  ?  ?  ?Therapeutic Exercise - for improved soft tissue flexibility and extensibility as needed for ROM, improved strength as needed to improve performance of CKC activities/functional movements, flexion-bias exercise for lumbar spine with lumbar paraspinal/truncal isometrics for analgesic effect and improved tolerance to moderate loading ?  ? ?Seated cervical retraction 2 x 10; patient reports mild remaining pain along L upper trapezius following repeated cervical retraction ? ?Cervical retraction-extension; 1x10 -  no significant symptoms reported following retraction-extension, remaining motion loss with bilateral rotation ? ?Upper body ergometer, 2 minutes forward, 2 minutes backward - for graded exposure to upper limb active motion, use of aerobics to mitigate symptoms related to central sensitization ? ?Wall slide, flexion; towel on wall; x10, 5 sec hold  ?  ?Modified dying bug; 2x10 alternating [moderate verbal and tactile cueing, demonstration to move opposite upper/lower limbs reciprocally], seated on blue Physioball today ?  ?Swiss ball seated marches; 2x10 alternating - with  careful guarding from therapist in case of significant loss of balance ?  ?Seated Blue Tband rows; on Blue SWB; 2x10 with verbal cueing and demo for scapular retraction and upright seated posture ?  ? ?  ?Patient education: Discussed contextual factors related to persistent pain and significant association between chronic smoking and post-surgical pain as well as chronic pain  ?  ?  ?*not today* ?Seated W with Blue Tband; verbal cueing and demo for upright posture; 2x10  ?Shoulder AAROM with dowel, external rotation; 2x10  ?Single-arm shoulder extension with shoulder flexion eccentric to increase ROM; x10 - stopped secondary to pain  ?Shoulder flexion LUE in supine; 1-lb Dbell; 2x12 ?Swiss ball bridge; Green ball; 2x10 ?Foam roll bilateral shoulder elevation; x12; heavy verbal cueing, demonstration, and tactile cueing for correct technique ?Shoulder flexion AAROM with dowel; 2x10, long-lever flexion  ?Pulleys, flexion and abduction ; x 1 Posterior pelvic tilt with alternating march; x10 alternating LLE/RLE ?Posterior pelvic tilt; 2x10 ?Lower trunk rotations; hooklying; x10 each direction, alternating ?Shoulder ER AAROM with dowel; x15  ?Swiss ball pelvic tilt, Green SWB; ?Swiss ball march, Green SWB; ?Seated green tband rows 2 x 12 ?Reviewed technique for active median nerve glide within tolerated ROM ?Forward yoga ball roll  outs x 15 ?Standing green tband GH extension x 10; pain so discontinued  ?Supine bodyweight GH flexion x 10 ?  ?  ? ? ? Manual Therapy - for symptom modulation, soft tissue sensitivity and mobility, joint mobility, ROM with pt in supine. ?  ?STM/DTM bilateral upper trapezius ? MET for improved rotation with antagonist contraction; x 5 repetition for both L and R rotation ? ?  ? ?  ?*not today* ?L shoulder PROM flexion, abduction, and ER ?general lumbar traction in lying (D/C) ?Manual median nerve glide; modified ROM as needed due to brief increase in arm and elbow pain ?  ?  ?  ?  ?OBJECTIVE  (measures taken are from most recent progress note unless otherwise stated) ?  ?Posture ?Significantly guarded posture of L upper limb, scapulae elevated and anteriorly tilted. Mild rounded shoulders ?  ?P

## 2021-07-11 ENCOUNTER — Encounter: Payer: Self-pay | Admitting: Physical Therapy

## 2021-07-11 ENCOUNTER — Encounter: Admitting: Physical Therapy

## 2021-07-12 ENCOUNTER — Ambulatory Visit: Admitting: Physical Therapy

## 2021-07-12 ENCOUNTER — Encounter: Payer: Self-pay | Admitting: Physical Therapy

## 2021-07-12 DIAGNOSIS — G8929 Other chronic pain: Secondary | ICD-10-CM

## 2021-07-12 DIAGNOSIS — M25512 Pain in left shoulder: Secondary | ICD-10-CM

## 2021-07-12 DIAGNOSIS — M542 Cervicalgia: Secondary | ICD-10-CM | POA: Diagnosis not present

## 2021-07-12 DIAGNOSIS — M5412 Radiculopathy, cervical region: Secondary | ICD-10-CM

## 2021-07-12 NOTE — Therapy (Addendum)
?OUTPATIENT PHYSICAL THERAPY TREATMENT AND PROGRESS NOTE/RE-CERTIFICATION ? ? ?Dates of reporting period  06/11/21   to   07/12/21 ? ? ? ?Patient Name: Jillian Ward ?MRN: [id number] ?DOB:January 05, 1966, 56 y.o., female ?Today's Date: 07/15/2021 ? ?PCP: Myrene Buddy, NP ?REFERRING PROVIDER: Venetia Night, MD ? ?END OF SESSION:  ? PT End of Session - 07/15/21 0441   ? ? Visit Number 18   ? Number of Visits 24   ? Date for PT Re-Evaluation 08/09/21   ? Authorization Type [employer], VL based on medical necessity   ? PT Start Time 1032   ? PT Stop Time 1115   ? PT Time Calculation (min) 43 min   ? Activity Tolerance Patient limited by pain   ? Behavior During Therapy [facility] for tasks assessed/performed   ? ?  ?  ? ?  ? ? ?Past Medical History:  ?Diagnosis Date  ? Anxiety   ? 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 Depression   ? Dyspnea   ? Hypertension   ? Nodule of right lung   ? Rib fracture   ? ?Past Surgical History:  ?Procedure Laterality Date  ? ANTERIOR CERVICAL DECOMP/DISCECTOMY FUSION N/A 10/18/2020  ? Procedure: C4-6 ANTERIOR CERVICAL DECOMPRESSION/DISCECTOMY FUSION 2 LEVELS;  Surgeon: Venetia Night, MD;  Location: [facility] ORS;  Service: Neurosurgery;  Laterality: N/A;  schedule as 1st case  ? APPENDECTOMY    ? CESAREAN SECTION    ? COLONOSCOPY WITH PROPOFOL N/A 01/15/2016  ? Procedure: COLONOSCOPY WITH PROPOFOL;  Surgeon: Scot Jun, MD;  Location: [facility] ENDOSCOPY;  Service: Endoscopy;  Laterality: N/A;  ? 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  ? 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 HERNIA REPAIR  2005  ? ABDOMINAL  ? HERNIA REPAIR    ? UMBILICAL  ? ?Patient Active Problem List  ? Diagnosis Date Noted  ? Cervical radiculopathy 10/18/2020  ? ?  ?  ?REFERRING DIAG: M54.12 (ICD-10-CM) - Radiculopathy, cervical region M54.42 (ICD-10-CM) - Lumbago with sciatica,  left side M54.41 (ICD-10-CM) - Lumbago with sciatica, right side G89.29 (ICD-10-CM) - Other chronic pain  ?  ?THERAPY DIAG:  ?Cervicalgia ?  ?Left shoulder pain, unspecified chronicity ?  ?Radiculopathy, cervical region ?  ?Chronic bilateral low back pain with left-sided sciatica ?  ?  ?PERTINENT HISTORY: Patient is a 56 year old female s/p C4-6 ACDF 10/18/20 with referral for cervical radiculopathy and lumbago with sciatica affecting bilateral lower extremities. Referring diagnoses includes chronic pain. Patient reports pain is primarily affecting L UT region and radiating down full length of L upper limb. Pt has (-) upper limb EMG (study completed 04/16/21). Pt has pain currently along L paracervical/UT region and down her L arm. Patient reports numbness/tingling down to her L hand/fingers without specific distribution to particular digits of L hand. Med hx including RA, anxiety, depression, HTN. Patient reports that she had more R upper quarter pain prior to surgery; more on L side presently. Patient is s/p C6-7 TFESI (03/26/21) without resolution of symptoms. No relief with L shoulder subacromial injection. 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 Patient reports disturbed sleep with change in position relieving one shoulder and then aggravating opposite side. Pt denies diplopia, dysarthria, dysphagia, vertigo. Pt reports some post-operative nausea - none presently. Patient reports losing weight after surgery due to not being able to eat (25  pounds following surgery last July). "Pain feels like 100" in regard to NPRS at worst. Patient reports constant pain. Pt states she can reach with her R arm well at this time. Primary complaint is left upper quarter; she has comorbid chronic low back pain; back pain is worsened with sitting on lawn mower, prolonged standing, repetitive lumbar flexion. Pt reports she has to sit to relieve low back pain when completing standing work for  extended period. Patient reports burning down to her anterior thighs. (+) shopping cart sign. No lower body numbness/tingling.  ?  ?  ?PRECAUTIONS: None ?  ?  ?SUBJECTIVE: Patient reports using Tylenol up to 3x/day at this time for pain management. Patient reports using Tylenol prior to completing yard work and before going out to run errands in town. Patient reports good progress to date; 60% SANE score to date. Patient reports pain up to 5/10 over the last week. Pt reports tolerating standing activity up to 10 minutes. Patient reports she needs improved pain in L arm primarily.  ? ?  ?PAIN:  ?Are you having pain? Yes: NPRS scale: 5/10 ?Pain location: L shoulder/arm ?  ?  ? ?  ?  ?  ?TODAY'S TREATMENT:  ?  ? ?Therapeutic Activities ? ?Re-assessment performed (see above and updated Goal section below) ? ?  ?  ?Therapeutic Exercise - for improved soft tissue flexibility and extensibility as needed for ROM, improved strength as needed to improve performance of CKC activities/functional movements, flexion-bias exercise for lumbar spine with lumbar paraspinal/truncal isometrics for analgesic effect and improved tolerance to moderate loading ?  ? ?Cervical retraction-extension; 1x10 -  no significant symptoms reported following retraction-extension, remaining motion loss with bilateral rotation ?  ?Upper body ergometer, 2 minutes forward, 2 minutes backward - for graded exposure to upper limb active motion, use of aerobics to mitigate symptoms related to central sensitization ?  ?Wall slide, flexion; towel on wall; x10, 5 sec hold  ?  ?Modified dying bug; 2x10 alternating [moderate verbal and tactile cueing, demonstration to move opposite upper/lower limbs reciprocally], seated on blue Physioball today ?  ? ?   ?Patient education: Educated patient on current progress c PT, expectations for outcomes given persistent pain/ongoing pain and functional limitations following ACDF along with contextual factors and Hx of RA;  discussed continued POC following discussion with patient on her subjective appraisal of benefit of PT ?  ? ? ?*next visit* ?Swiss ball seated marches; 2x10 alternating - with careful guarding from therapist in case of significant loss of balance ?Seated Blue Tband rows; on Blue SWB; 2x10 with verbal cueing and demo for scapular retraction and upright seated posture ? ?  ?*not today* ?Seated cervical retraction 2 x 10; patient reports mild remaining pain along L upper trapezius following repeated cervical retraction ?Seated W with Blue Tband; verbal cueing and demo for upright posture; 2x10  ?Shoulder AAROM with dowel, external rotation; 2x10  ?Single-arm shoulder extension with shoulder flexion eccentric to increase ROM; x10 - stopped secondary to pain  ?Shoulder flexion LUE in supine; 1-lb Dbell; 2x12 ?Swiss ball bridge; Green ball; 2x10 ?Foam roll bilateral shoulder elevation; x12; heavy verbal cueing, demonstration, and tactile cueing for correct technique ?Shoulder flexion AAROM with dowel; 2x10, long-lever flexion  ?Pulleys, flexion and abduction ; x 1 Posterior pelvic tilt with alternating march; x10 alternating LLE/RLE ?Posterior pelvic tilt; 2x10 ?Lower trunk rotations; hooklying; x10 each direction, alternating ?Shoulder ER AAROM with dowel; x15  ?Swiss ball pelvic tilt, Green SWB; ?Swiss ball  march, Green SWB; ?Seated green tband rows 2 x 12 ?Reviewed technique for active median nerve glide within tolerated ROM ?Forward yoga ball roll outs x 15 ?Standing green tband GH extension x 10; pain so discontinued  ?Supine bodyweight GH flexion x 10 ?  ?  ? ?  ?  ?  ?  ?OBJECTIVE (measures taken are from most recent progress note unless otherwise stated) ?  ? ?07/12/21 ?Posture ?Intermittent guarded posture of L upper limb, scapulae elevated and anteriorly tilted. Mild rounded shoulders ?  ?Palpation ?Tenderness to palpation along L upper trapezius, L supraspinatus, L infraspinatus, L middle deltoid ?   ?Gait ?Unremarkable ?  ?  ?Strength ?R/L ?4/4* Shoulder flexion (anterior deltoid/pec major/coracobrachialis, axillary n. (C5/6) and musculocutaneous n. (C5-7)) ?5/4-* Shoulder abduction (deltoid/supraspinatus, axillary/s

## 2021-07-15 NOTE — Addendum Note (Signed)
Addended by: Consuela MimesPETERSON, Shantice Menger T on: 07/15/2021 05:29 AM ? ? Modules accepted: Orders ? ?

## 2021-07-16 ENCOUNTER — Ambulatory Visit: Admitting: Physical Therapy

## 2021-07-16 DIAGNOSIS — M542 Cervicalgia: Secondary | ICD-10-CM | POA: Diagnosis not present

## 2021-07-16 DIAGNOSIS — M25512 Pain in left shoulder: Secondary | ICD-10-CM

## 2021-07-16 DIAGNOSIS — G8929 Other chronic pain: Secondary | ICD-10-CM

## 2021-07-16 DIAGNOSIS — M5412 Radiculopathy, cervical region: Secondary | ICD-10-CM

## 2021-07-16 NOTE — Therapy (Signed)
?OUTPATIENT PHYSICAL THERAPY TREATMENT NOTE ? ? ?Patient Name: Jillian Ward ?MRN: [id number] ?DOB:February 21, 1966, 56 y.o., female ?Today's Date: 07/16/2021 ? ?PCP: Sallee Lange, NP ?REFERRING PROVIDER: Meade Maw, MD ? ?END OF SESSION:  ? PT End of Session - 07/16/21 1647   ? ? Visit Number 19   ? Number of Visits 24   ? Date for PT Re-Evaluation 08/09/21   ? Authorization Type [employer], VL based on medical necessity   ? PT Start Time 1545   ? PT Stop Time 1640   ? PT Time Calculation (min) 55 min   ? Activity Tolerance Patient limited by pain   ? Behavior During Therapy [facility] for tasks assessed/performed   ? ?  ?  ? ?  ? ? ?Past Medical History:  ?Diagnosis Date  ? Anxiety   ? 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 Depression   ? Dyspnea   ? Hypertension   ? Nodule of right lung   ? Rib fracture   ? ?Past Surgical History:  ?Procedure Laterality Date  ? ANTERIOR CERVICAL DECOMP/DISCECTOMY FUSION N/A 10/18/2020  ? Procedure: C4-6 ANTERIOR CERVICAL DECOMPRESSION/DISCECTOMY FUSION 2 LEVELS;  Surgeon: Meade Maw, MD;  Location: [facility] ORS;  Service: Neurosurgery;  Laterality: N/A;  schedule as 1st case  ? APPENDECTOMY    ? CESAREAN SECTION    ? COLONOSCOPY WITH PROPOFOL N/A 01/15/2016  ? Procedure: COLONOSCOPY WITH PROPOFOL;  Surgeon: Manya Silvas, MD;  Location: [facility] ENDOSCOPY;  Service: Endoscopy;  Laterality: N/A;  ? 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  ? 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 HERNIA REPAIR  2005  ? ABDOMINAL  ? HERNIA REPAIR    ? UMBILICAL  ? ?Patient Active Problem List  ? Diagnosis Date Noted  ? Cervical radiculopathy 10/18/2020  ? ?  ?  ?REFERRING DIAG: M54.12 (ICD-10-CM) - Radiculopathy, cervical region M54.42 (ICD-10-CM) - Lumbago with sciatica, left side M54.41 (ICD-10-CM) - Lumbago with sciatica, right side G89.29 (ICD-10-CM) -  Other chronic pain  ?  ?THERAPY DIAG:  ?Cervicalgia ?  ?Left shoulder pain, unspecified chronicity ?  ?Radiculopathy, cervical region ?  ?Chronic bilateral low back pain with left-sided sciatica ?  ?  ?PERTINENT HISTORY: Patient is a 56 year old female s/p C4-6 ACDF 10/18/20 with referral for cervical radiculopathy and lumbago with sciatica affecting bilateral lower extremities. Referring diagnoses includes chronic pain. Patient reports pain is primarily affecting L UT region and radiating down full length of L upper limb. Pt has (-) upper limb EMG (study completed 04/16/21). Pt has pain currently along L paracervical/UT region and down her L arm. Patient reports numbness/tingling down to her L hand/fingers without specific distribution to particular digits of L hand. Med hx including RA, anxiety, depression, HTN. Patient reports that she had more R upper quarter pain prior to surgery; more on L side presently. Patient is s/p C6-7 TFESI (03/26/21) without resolution of symptoms. No relief with L shoulder subacromial injection. 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 Patient reports disturbed sleep with change in position relieving one shoulder and then aggravating opposite side. Pt denies diplopia, dysarthria, dysphagia, vertigo. Pt reports some post-operative nausea - none presently. Patient reports losing weight after surgery due to not being able to eat (25 pounds following surgery last July). "Pain feels like 100" in regard to NPRS at worst. Patient reports  constant pain. Pt states she can reach with her R arm well at this time. Primary complaint is left upper quarter; she has comorbid chronic low back pain; back pain is worsened with sitting on lawn mower, prolonged standing, repetitive lumbar flexion. Pt reports she has to sit to relieve low back pain when completing standing work for extended period. Patient reports burning down to her anterior thighs. (+) shopping cart  sign. No lower body numbness/tingling.  ?  ?  ?PRECAUTIONS: None ?  ?  ?SUBJECTIVE: Patient reports her pain "may have gotten up to a 6" this past weekend. She will be increasing her dosage on Lyrica to address remaining upper quarter pain. Patient reports 4-5/10 pain at arrival to PT. Patient reports compliance with her repeated movement program. She states her neck was hurting this past weekend "more than it has been."  ?  ?  ?PAIN:  ?Are you having pain? Yes: NPRS scale: 4-5/10 ?Pain location: L shoulder/arm ?  ?  ?  ?  ?  ?  ?TODAY'S TREATMENT:  ?  ? ?  ?  ?Therapeutic Exercise - for improved soft tissue flexibility and extensibility as needed for ROM, improved strength as needed to improve performance of CKC activities/functional movements, flexion-bias exercise for lumbar spine with lumbar paraspinal/truncal isometrics for analgesic effect and improved tolerance to moderate loading ?  ?Upper body ergometer, 2 minutes forward, 2 minutes backward - for graded exposure to upper limb active motion, use of aerobics to mitigate symptoms related to central sensitization, subjective information also gathered during this time   ? ?Cervical retraction-extension; 1x10 -  no significant symptoms reported following retraction-extension, remaining motion loss with bilateral rotation ? ?3-way shoulder isometrics; flexion, abd, extension; x10, 5 sec each - for analgesic effect produced by isometric mm contractions ? ?Wall slide, flexion; towel on wall; x10, 5 sec hold  ?  ?Modified dying bug; 2x10 alternating [moderate verbal and tactile cueing, demonstration to move opposite upper/lower limbs reciprocally], seated on blue Physioball today ? ?Seated  Tband rows; on Blue Physioball; with 5 sec isometric hold for endurance of scapular retractors/depressors; 1x10 with Blue Tband, 1x10 with Green Tband ?  ?   ?*not today* ?Swiss ball seated marches; 2x10 alternating - with careful guarding from therapist in case of significant  loss of balance ?Seated cervical retraction 2 x 10; patient reports mild remaining pain along L upper trapezius following repeated cervical retraction ?Seated W with Blue Tband; verbal cueing and demo for upright posture; 2x10  ?Shoulder AAROM with dowel, external rotation; 2x10  ?Single-arm shoulder extension with shoulder flexion eccentric to increase ROM; x10 - stopped secondary to pain  ?Shoulder flexion LUE in supine; 1-lb Dbell; 2x12 ?Swiss ball bridge; Green ball; 2x10 ?Foam roll bilateral shoulder elevation; x12; heavy verbal cueing, demonstration, and tactile cueing for correct technique ?Shoulder flexion AAROM with dowel; 2x10, long-lever flexion  ?Pulleys, flexion and abduction ; x 1 Posterior pelvic tilt with alternating march; x10 alternating LLE/RLE ?Posterior pelvic tilt; 2x10 ?Lower trunk rotations; hooklying; x10 each direction, alternating ?Shoulder ER AAROM with dowel; x15  ?Swiss ball pelvic tilt, Green SWB; ?Swiss ball march, Green SWB; ?Seated green tband rows 2 x 12 ?Reviewed technique for active median nerve glide within tolerated ROM ?Forward yoga ball roll outs x 15 ?Standing green tband GH extension x 10; pain so discontinued  ?Supine bodyweight GH flexion x 10 ?  ? ? ?Self-Care/Home Management - for patient education on home TENS use for chronic pain management ? ?  Cleared patient for any conditions or implants/electronic devices that would contraindicate use of TENS. Discussed at length with patient use of EMPI unit and appropriate parameters for home TENS use and setup of electrodes for affected body region. Trial of  EMPI unit for 5 minutes with setup with 2 electrodes at middle deltoid and 2/3 length of arm placed along lateral surface; intensity 5. ?  ?  ?  ?  ?  ?OBJECTIVE (measures taken are from most recent progress note unless otherwise stated) ?  ?  ?07/12/21 ?Posture ?Intermittent guarded posture of L upper limb, scapulae elevated and anteriorly tilted. Mild rounded shoulders ?   ?Palpation ?Tenderness to palpation along L upper trapezius, L supraspinatus, L infraspinatus, L middle deltoid ?  ?Gait ?Unremarkable ?  ?  ?Strength ?R/L ?4/4* Shoulder flexion (anterior deltoid/pec maj

## 2021-07-19 ENCOUNTER — Ambulatory Visit: Admitting: Physical Therapy

## 2021-07-19 DIAGNOSIS — M5412 Radiculopathy, cervical region: Secondary | ICD-10-CM

## 2021-07-19 DIAGNOSIS — G8929 Other chronic pain: Secondary | ICD-10-CM

## 2021-07-19 DIAGNOSIS — M25512 Pain in left shoulder: Secondary | ICD-10-CM

## 2021-07-19 DIAGNOSIS — M542 Cervicalgia: Secondary | ICD-10-CM | POA: Diagnosis not present

## 2021-07-19 NOTE — Therapy (Signed)
?OUTPATIENT PHYSICAL THERAPY TREATMENT NOTE ? ? ?Patient Name: Jillian CoupeStacey June Ward ?MRN: [id number] ?DOB:[birth year], 56 y.o., female ?Today's Date: 07/23/2021 ? ?PCP: Myrene BuddyGauger, Sarah Kathryn, NP ?REFERRING PROVIDER: Venetia NightYarbrough, Chester, MD ? ?END OF SESSION:  ? PT End of Session - 07/23/21 0750   ? ? Visit Number 20   ? Number of Visits 24   ? Date for PT Re-Evaluation 08/09/21   ? Authorization Type [employer], VL based on medical necessity   ? PT Start Time 1432   ? PT Stop Time 1515   ? PT Time Calculation (min) 43 min   ? Activity Tolerance Patient limited by pain   ? [facility] assessed/performed   ? ?  ?  ? ?  ? ? ?Past Medical History:  ?Diagnosis Date  ? Anxiety   ? 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 Depression   ? Dyspnea   ? Hypertension   ? Nodule of right lung   ? Rib fracture   ? ?Past Surgical History:  ?Procedure Laterality Date  ? ANTERIOR CERVICAL DECOMP/DISCECTOMY FUSION N/A 10/18/2020  ? Procedure: C4-6 ANTERIOR CERVICAL DECOMPRESSION/DISCECTOMY FUSION 2 LEVELS;  Surgeon: Venetia NightYarbrough, Chester, MD;  Location: [facility] ORS;  Service: Neurosurgery;  Laterality: N/A;  schedule as 1st case  ? APPENDECTOMY    ? CESAREAN SECTION    ? COLONOSCOPY WITH PROPOFOL N/A 01/15/2016  ? Procedure: COLONOSCOPY WITH PROPOFOL;  Surgeon: Scot Junobert T Elliott, MD;  [facility];  Service: Endoscopy;  Laterality: N/A;  ? 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  ? Procedure: 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.;  Surgeon: Christena FlakePoggi, John J, MD;  Location: [facility] ORS;  Service: Orthopedics;  Laterality: Left;  ? HERNIA REPAIR  2005  ? ABDOMINAL  ? HERNIA REPAIR    ? UMBILICAL  ? ?Patient Active Problem List  ? Diagnosis Date Noted  ? Cervical radiculopathy 10/18/2020  ? ?  ?  ?REFERRING DIAG: M54.12 (ICD-10-CM) - Radiculopathy, cervical region M54.42 (ICD-10-CM) - Lumbago with sciatica, left side M54.41 (ICD-10-CM) - Lumbago with sciatica, right side G89.29 (ICD-10-CM) -  Other chronic pain  ?  ?THERAPY DIAG:  ?Cervicalgia ?  ?Left shoulder pain, unspecified chronicity ?  ?Radiculopathy, cervical region ?  ?Chronic bilateral low back pain with left-sided sciatica ?  ?  ?PERTINENT HISTORY: Patient is a 56 year old female s/p C4-6 ACDF 10/18/20 with referral for cervical radiculopathy and lumbago with sciatica affecting bilateral lower extremities. Referring diagnoses includes chronic pain. Patient reports pain is primarily affecting L UT region and radiating down full length of L upper limb. Pt has (-) upper limb EMG (study completed 04/16/21). Pt has pain currently along L paracervical/UT region and down her L arm. Patient reports numbness/tingling down to her L hand/fingers without specific distribution to particular digits of L hand. Med hx including RA, anxiety, depression, HTN. Patient reports that she had more R upper quarter pain prior to surgery; more on L side presently. Patient is s/p C6-7 TFESI (03/26/21) without resolution of symptoms. No relief with L shoulder subacromial injection. 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 Patient reports disturbed sleep with change in position relieving one shoulder and then aggravating opposite side. Pt denies diplopia, dysarthria, dysphagia, vertigo. Pt reports some post-operative nausea - none presently. Patient reports losing weight after surgery due to not being able to eat (25 pounds following surgery last July). "Pain feels like 100" in regard to NPRS at worst. Patient reports  constant pain. Pt states she can reach with her R arm well at this time. Primary complaint is left upper quarter; she has comorbid chronic low back pain; back pain is worsened with sitting on lawn mower, prolonged standing, repetitive lumbar flexion. Pt reports she has to sit to relieve low back pain when completing standing work for extended period. Patient reports burning down to her anterior thighs. (+) shopping cart  sign. No lower body numbness/tingling.  ?  ?  ?PRECAUTIONS: None ?  ?  ?SUBJECTIVE: Patient reports minimal response to trial of TENS after last visit with no reported benefit during or after TENS use. She repots ongoing L arm and neck pain. Pt reports compliance with given home exercises including update c shoulder isometrics last visit.  ?  ?  ?PAIN:  ?Are you having pain? Yes: NPRS scale: 4-5/10 ?Pain location: L shoulder/arm ?  ?  ?  ?  ?  ?  ?TODAY'S TREATMENT:  ?  ?  ?  ?  ?Therapeutic Exercise - for improved soft tissue flexibility and extensibility as needed for ROM, improved strength as needed to improve performance of CKC activities/functional movements, flexion-bias exercise for lumbar spine with lumbar paraspinal/truncal isometrics for analgesic effect and improved tolerance to moderate loading ?  ?Upper body ergometer, 2 minutes forward, 2 minutes backward - for graded exposure to upper limb active motion, use of aerobics to mitigate symptoms related to central sensitization, subjective information also gathered during this time   ? ?Cervical retraction-extension; reviewed ? ?Shoulder flexion AAROM with dowel; 2x10, long-lever flexion   ? ?Shoulder flexion AROM in supine; x5 (stopped due to pain) ?   ?3-way shoulder isometrics; flexion, abd, extension; x10, 5 sec each - for analgesic effect produced by isometric mm contractions, reviewed for carryover to HEP ? ?Wall ball roll up wall for shoulder flexion, yellow physioball; x10, 5 sec ? ?Modified dying bug; 2x10 alternating [moderate verbal and tactile cueing, demonstration to move opposite upper/lower limbs reciprocally], seated on blue Physioball today ?  ? ?  ?   ?*not today* ?Seated  Tband rows; on Blue Physioball; with 5 sec isometric hold for endurance of scapular retractors/depressors; 1x10 with Blue Tband, 1x10 with Green Tband ?Swiss ball seated marches; 2x10 alternating - with careful guarding from therapist in case of significant loss of  balance ?Seated cervical retraction 2 x 10; patient reports mild remaining pain along L upper trapezius following repeated cervical retraction ?Seated W with Blue Tband; verbal cueing and demo for upright posture; 2x10  ?Shoulder AAROM with dowel, external rotation; 2x10  ?Single-arm shoulder extension with shoulder flexion eccentric to increase ROM; x10 - stopped secondary to pain  ?Shoulder flexion LUE in supine; 1-lb Dbell; 2x12 ?Swiss ball bridge; Green ball; 2x10 ?Foam roll bilateral shoulder elevation; x12; heavy verbal cueing, demonstration, and tactile cueing for correct technique ?Pulleys, flexion and abduction ; x 1 Posterior pelvic tilt with alternating march; x10 alternating LLE/RLE ?Posterior pelvic tilt; 2x10 ?Lower trunk rotations; hooklying; x10 each direction, alternating ?Shoulder ER AAROM with dowel; x15  ?Swiss ball pelvic tilt, Green SWB; ?Swiss ball march, Green SWB; ?Seated green tband rows 2 x 12 ?Reviewed technique for active median nerve glide within tolerated ROM ?Forward yoga ball roll outs x 15 ?Standing green tband GH extension x 10; pain so discontinued  ?Supine bodyweight GH flexion x 10 ?  ?  ?  ?  ?  ?  ?  ?OBJECTIVE (measures taken are from most recent progress note unless otherwise  stated) ?  ?  ?07/12/21 ?Posture ?Intermittent guarded posture of L upper limb, scapulae elevated and anteriorly tilted. Mild rounded shoulders ?  ?Palpation ?Tenderness to palpation along L upper trapezius, L supraspinatus, L infraspinatus, L middle deltoid ?  ?Gait ?Unremarkable ?  ?  ?Strength ?R/L ?4/4* Shoulder flexion (anterior deltoid/pec major/coracobrachialis, axillary n. (C5/6) and musculocutaneous n. (C5-7)) ?5/4-* Shoulder abduction (deltoid/supraspinatus, axillary/suprascapular n, C5) ?5/4-* Shoulder external rotation (infraspinatus/teres minor) ?5/4* Shoulder internal rotation (subcapularis/lats/pec major) ?5/5 Elbow flexion (biceps brachii, brachialis, brachioradialis, musculoskeletal n,  C5/6) ?5/5 Elbow extension (triceps, radial n, C7) ?5/5 Wrist Extension (C6/7) ?5/5 Finger adduction (interossei, ulnar n, T1) ?  ?R/L  ?5/5 Hip extension ?  ?  ?AROM ?R/L ?50 Cervical Flexion ?43 Cervical E

## 2021-07-23 ENCOUNTER — Ambulatory Visit: Admitting: Physical Therapy

## 2021-07-23 ENCOUNTER — Encounter: Payer: Self-pay | Admitting: Physical Therapy

## 2021-07-23 DIAGNOSIS — M542 Cervicalgia: Secondary | ICD-10-CM

## 2021-07-23 DIAGNOSIS — M5412 Radiculopathy, cervical region: Secondary | ICD-10-CM

## 2021-07-23 DIAGNOSIS — G8929 Other chronic pain: Secondary | ICD-10-CM

## 2021-07-23 DIAGNOSIS — M25512 Pain in left shoulder: Secondary | ICD-10-CM

## 2021-07-23 NOTE — Therapy (Signed)
?OUTPATIENT PHYSICAL THERAPY TREATMENT NOTE ? ? ?Patient Name: Jillian Ward ?MRN: [id number] ?DOB:25-Jul-1965, 56 y.o., female ?Today's Date: 07/23/2021 ? ?PCP: Myrene BuddyGauger, Sarah Kathryn, NP ?REFERRING PROVIDER: Venetia NightYarbrough, Chester, MD ? ?END OF SESSION:  ? PT End of Session - 07/23/21 1358   ? ? Visit Number 21   ? Number of Visits 24   ? Date for PT Re-Evaluation 08/09/21   ? Authorization Type [employer], VL based on medical necessity   ? PT Start Time 1331   ? PT Stop Time 1414   ? PT Time Calculation (min) 43 min   ? Activity Tolerance Patient limited by pain   ? Behavior During Therapy [facility] for tasks assessed/performed   ? ?  ?  ? ?  ? ? ?Past Medical History:  ?Diagnosis Date  ? Anxiety   ? 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 Depression   ? Dyspnea   ? Hypertension   ? Nodule of right lung   ? Rib fracture   ? ?Past Surgical History:  ?Procedure Laterality Date  ? ANTERIOR CERVICAL DECOMP/DISCECTOMY FUSION N/A 10/18/2020  ? Procedure: C4-6 ANTERIOR CERVICAL DECOMPRESSION/DISCECTOMY FUSION 2 LEVELS;  Surgeon: Venetia NightYarbrough, Chester, MD;  Location: [facility] ORS;  Service: Neurosurgery;  Laterality: N/A;  schedule as 1st case  ? APPENDECTOMY    ? CESAREAN SECTION    ? COLONOSCOPY WITH PROPOFOL N/A 01/15/2016  ? Procedure: COLONOSCOPY WITH PROPOFOL;  Surgeon: Scot Junobert T Elliott, MD;  [facility];  Service: Endoscopy;  Laterality: N/A;  ? 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  ? Procedure: 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.;  Surgeon: Christena FlakePoggi, John J, MD;  Location: [facility] ORS;  Service: Orthopedics;  Laterality: Left;  ? HERNIA REPAIR  2005  ? ABDOMINAL  ? HERNIA REPAIR    ? UMBILICAL  ? ?Patient Active Problem List  ? Diagnosis Date Noted  ? Cervical radiculopathy 10/18/2020  ? ?  ?  ?REFERRING DIAG: M54.12 (ICD-10-CM) - Radiculopathy, cervical region M54.42 (ICD-10-CM) - Lumbago with sciatica, left side M54.41 (ICD-10-CM) - Lumbago with sciatica, right side G89.29 (ICD-10-CM) -  Other chronic pain  ?  ?THERAPY DIAG:  ?Cervicalgia ?  ?Left shoulder pain, unspecified chronicity ?  ?Radiculopathy, cervical region ?  ?Chronic bilateral low back pain with left-sided sciatica ?  ?  ?PERTINENT HISTORY: Patient is a 56 year old female s/p C4-6 ACDF 10/18/20 with referral for cervical radiculopathy and lumbago with sciatica affecting bilateral lower extremities. Referring diagnoses includes chronic pain. Patient reports pain is primarily affecting L UT region and radiating down full length of L upper limb. Pt has (-) upper limb EMG (study completed 04/16/21). Pt has pain currently along L paracervical/UT region and down her L arm. Patient reports numbness/tingling down to her L hand/fingers without specific distribution to particular digits of L hand. Med hx including RA, anxiety, depression, HTN. Patient reports that she had more R upper quarter pain prior to surgery; more on L side presently. Patient is s/p C6-7 TFESI (03/26/21) without resolution of symptoms. No relief with L shoulder subacromial injection. 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 Patient reports disturbed sleep with change in position relieving one shoulder and then aggravating opposite side. Pt denies diplopia, dysarthria, dysphagia, vertigo. Pt reports some post-operative nausea - none presently. Patient reports losing weight after surgery due to not being able to eat (25 pounds following surgery last July). "Pain feels like 100" in regard to NPRS at worst. Patient reports  constant pain. Pt states she can reach with her R arm well at this time. Primary complaint is left upper quarter; she has comorbid chronic low back pain; back pain is worsened with sitting on lawn mower, prolonged standing, repetitive lumbar flexion. Pt reports she has to sit to relieve low back pain when completing standing work for extended period. Patient reports burning down to her anterior thighs. (+) shopping cart  sign. No lower body numbness/tingling.  ?  ?  ?PRECAUTIONS: None ?  ?  ?SUBJECTIVE: Patient reports fair tolerance of isometrics. She reports ongoing pain affecting L paracervical region and L upper trapezius region. Patient reports pain as far as her L arm to length of her elbow. Patient reports continuing compliance with cervical retraction-extension, usually about 4x/day for 1 set.  ?  ?  ?PAIN:  ?Are you having pain? Yes: NPRS scale: 5/10 ?Pain location: L shoulder/arm ?  ?  ?  ?  ?  ?  ?TODAY'S TREATMENT:  ?  ?  ?  ?  ?Therapeutic Exercise - for improved soft tissue flexibility and extensibility as needed for ROM, improved strength as needed to improve performance of CKC activities/functional movements, flexion-bias exercise for lumbar spine with lumbar paraspinal/truncal isometrics for analgesic effect and improved tolerance to moderate loading ?  ?Upper body ergometer, 2 minutes forward, 2 minutes backward - for graded exposure to upper limb active motion, use of aerobics to mitigate symptoms related to central sensitization, subjective information also gathered during this time   ?  ?Cervical retraction-extension; x10 - neck pain during, no change after ? ?Cervical retraction with L sidebend; x10 - no effect during ("pull" only), decreased pain ("loosened up" after)  ? ?Shoulder flexion AAROM with dowel; 2x10, long-lever flexion, with 2-lb ankle weight strapped onto dowel ? ?Wall ball roll up wall for shoulder flexion, yellow physioball; x10, 5 sec ?  ?Seated Tband rows; standing with upright posture; 2x10 with Green Tband ?  ?Modified dying bug; 2x10 alternating [moderate verbal and tactile cueing, demonstration to move opposite upper/lower limbs reciprocally], seated on blue Physioball today ?  ?Seated swiss ball pelvic clock; x15 ea dir, seated on blue physioball - for graded lumbopelvic movement and exposure to multi-planar lumbar spine AROM ? ?PATIENT EDUCATION: Updated and reviewed current HEP and MDT  parameters  ?  ?  ?   ?*not today* ?Shoulder flexion AROM in supine; x5 (stopped due to pain) ?3-way shoulder isometrics; flexion, abd, extension; x10, 5 sec each - for analgesic effect produced by isometric mm contractions, reviewed for carryover to HEP ? ?Swiss ball seated marches; 2x10 alternating - with careful guarding from therapist in case of significant loss of balance ?Seated cervical retraction 2 x 10; patient reports mild remaining pain along L upper trapezius following repeated cervical retraction ?Seated W with Blue Tband; verbal cueing and demo for upright posture; 2x10  ?Shoulder AAROM with dowel, external rotation; 2x10  ?Single-arm shoulder extension with shoulder flexion eccentric to increase ROM; x10 - stopped secondary to pain  ?Shoulder flexion LUE in supine; 1-lb Dbell; 2x12 ?Swiss ball bridge; Green ball; 2x10 ?Foam roll bilateral shoulder elevation; x12; heavy verbal cueing, demonstration, and tactile cueing for correct technique ?Pulleys, flexion and abduction ; x 1 Posterior pelvic tilt with alternating march; x10 alternating LLE/RLE ?Posterior pelvic tilt; 2x10 ?Lower trunk rotations; hooklying; x10 each direction, alternating ?Shoulder ER AAROM with dowel; x15  ?Swiss ball pelvic tilt, Green SWB; ?Swiss ball march, Green SWB; ?Seated green tband rows 2 x 12 ?  Reviewed technique for active median nerve glide within tolerated ROM ?Forward yoga ball roll outs x 15 ?Standing green tband GH extension x 10; pain so discontinued  ?Supine bodyweight GH flexion x 10 ?  ?  ?  ?  ?  ?  ?  ?OBJECTIVE (measures taken are from most recent progress note unless otherwise stated) ?  ?  ?07/12/21 ?Posture ?Intermittent guarded posture of L upper limb, scapulae elevated and anteriorly tilted. Mild rounded shoulders ?  ?Palpation ?Tenderness to palpation along L upper trapezius, L supraspinatus, L infraspinatus, L middle deltoid ?  ?Gait ?Unremarkable ?  ?  ?Strength ?R/L ?4/4* Shoulder flexion (anterior  deltoid/pec major/coracobrachialis, axillary n. (C5/6) and musculocutaneous n. (C5-7)) ?5/4-* Shoulder abduction (deltoid/supraspinatus, axillary/suprascapular n, C5) ?5/4-* Shoulder external rotation (infra

## 2021-07-25 ENCOUNTER — Ambulatory Visit: Admitting: Physical Therapy

## 2021-07-25 ENCOUNTER — Encounter: Payer: Self-pay | Admitting: Physical Therapy

## 2021-07-25 DIAGNOSIS — M542 Cervicalgia: Secondary | ICD-10-CM | POA: Diagnosis not present

## 2021-07-25 DIAGNOSIS — M25512 Pain in left shoulder: Secondary | ICD-10-CM

## 2021-07-25 DIAGNOSIS — G8929 Other chronic pain: Secondary | ICD-10-CM

## 2021-07-25 DIAGNOSIS — M5412 Radiculopathy, cervical region: Secondary | ICD-10-CM

## 2021-07-25 NOTE — Therapy (Signed)
?OUTPATIENT PHYSICAL THERAPY TREATMENT NOTE ? ? ?Patient Name: Jillian Ward ?MRN: [id number] ?DOB:Jul 22, 1965, 56 y.o., female ?Today's Date: 07/25/2021 ? ?PCP: Myrene Buddy, NP ?REFERRING PROVIDER: Venetia Night, MD ? ?END OF SESSION:  ? PT End of Session - 07/25/21 1334   ? ? Visit Number 22   ? Number of Visits 24   ? Date for PT Re-Evaluation 08/09/21   ? Authorization Type [employer], VL based on medical necessity   ? PT Start Time 1331   ? PT Stop Time 1415   ? PT Time Calculation (min) 44 min   ? Activity Tolerance Patient limited by pain   ? Behavior During Therapy [facility] for tasks assessed/performed   ? ?  ?  ? ?  ? ? ?Past Medical History:  ?Diagnosis Date  ? Anxiety   ? 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 Depression   ? Dyspnea   ? Hypertension   ? Nodule of right lung   ? Rib fracture   ? ?Past Surgical History:  ?Procedure Laterality Date  ? ANTERIOR CERVICAL DECOMP/DISCECTOMY FUSION N/A 10/18/2020  ? Procedure: C4-6 ANTERIOR CERVICAL DECOMPRESSION/DISCECTOMY FUSION 2 LEVELS;  Surgeon: Venetia Night, MD;  Location: [facility] ORS;  Service: Neurosurgery;  Laterality: N/A;  schedule as 1st case  ? APPENDECTOMY    ? CESAREAN SECTION    ? COLONOSCOPY WITH PROPOFOL N/A 01/15/2016  ? Procedure: COLONOSCOPY WITH PROPOFOL;  Surgeon: Scot Jun, MD;  Location: [facility] ENDOSCOPY;  Service: Endoscopy;  Laterality: N/A;  ? 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  ? 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 HERNIA REPAIR  2005  ? ABDOMINAL  ? HERNIA REPAIR    ? UMBILICAL  ? ?Patient Active Problem List  ? Diagnosis Date Noted  ? Cervical radiculopathy 10/18/2020  ? ? ?REFERRING DIAG: M54.12 (ICD-10-CM) - Radiculopathy, cervical region M54.42 (ICD-10-CM) - Lumbago with sciatica, left side M54.41 (ICD-10-CM) - Lumbago with sciatica, right side G89.29 (ICD-10-CM) -  Other chronic pain  ?  ?THERAPY DIAG:  ?Cervicalgia ?  ?Left shoulder pain, unspecified chronicity ?  ?Radiculopathy, cervical region ?  ?Chronic bilateral low back pain with left-sided sciatica ?  ?  ?PERTINENT HISTORY: Patient is a 56 year old female s/p C4-6 ACDF 10/18/20 with referral for cervical radiculopathy and lumbago with sciatica affecting bilateral lower extremities. Referring diagnoses includes chronic pain. Patient reports pain is primarily affecting L UT region and radiating down full length of L upper limb. Pt has (-) upper limb EMG (study completed 04/16/21). Pt has pain currently along L paracervical/UT region and down her L arm. Patient reports numbness/tingling down to her L hand/fingers without specific distribution to particular digits of L hand. Med hx including RA, anxiety, depression, HTN. Patient reports that she had more R upper quarter pain prior to surgery; more on L side presently. Patient is s/p C6-7 TFESI (03/26/21) without resolution of symptoms. No relief with L shoulder subacromial injection. 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 Patient reports disturbed sleep with change in position relieving one shoulder and then aggravating opposite side. Pt denies diplopia, dysarthria, dysphagia, vertigo. Pt reports some post-operative nausea - none presently. Patient reports losing weight after surgery due to not being able to eat (25 pounds following surgery last July). "Pain feels like 100" in regard to NPRS at worst. Patient reports constant pain. Pt  states she can reach with her R arm well at this time. Primary complaint is left upper quarter; she has comorbid chronic low back pain; back pain is worsened with sitting on lawn mower, prolonged standing, repetitive lumbar flexion. Pt reports she has to sit to relieve low back pain when completing standing work for extended period. Patient reports burning down to her anterior thighs. (+) shopping cart  sign. No lower body numbness/tingling.  ?  ?  ?PRECAUTIONS: None ?  ?  ?SUBJECTIVE:   Patient reports having fall earlier today on steps when her dog stopped in front of her when descending steps. She reports notable increase in pain earlier, but it has returned to baseline. Pt is unsure if she has any bruising. Pt reports her initial increase in pain did subside. Pt reports ongoing arm pain at similar level. Pt reports tolerating her modified repeated movement well at home. Pt reports concern with limit motion with L upper limb going behind her back - attributed to both pain and stiffness. ?  ?  ?PAIN:  ?Are you having pain? Yes: NPRS scale: 4/10 ?Pain location: L shoulder and radiating down to elbow  ?  ?  ?  ?  ?  ?  ?TODAY'S TREATMENT:  ?  ?  ?  ?  ?Therapeutic Exercise - for improved soft tissue flexibility and extensibility as needed for ROM, improved strength as needed to improve performance of CKC activities/functional movements, flexion-bias exercise for lumbar spine with lumbar paraspinal/truncal isometrics for analgesic effect and improved tolerance to moderate loading ?  ?*Quick screen for serious L shoulder injury/Fx ? ?Upper body ergometer, 2 minutes forward, 2 minutes backward - for graded exposure to upper limb active motion, use of aerobics to mitigate symptoms related to central sensitization, subjective information also gathered during this time   ?  ?Cervical retraction with L sidebend, seated; x10 - no effect during ("pull" only), decreased pain ("loosened up" after)  ? ?Supine shoulder flexion AROM; 2x10 to full range tolerated (no significant pain today) ? ?Sidelying shoulder abduction AROM, sidelying R; x5 (stopped secondary to notable arm pain limiting ability to perform this motion) ? ?Modified dying bug; 2x10 alternating [moderate verbal and tactile cueing, demonstration to move opposite upper/lower limbs reciprocally], beginning in hooklying with alternating knee extension today ? ?Lower  trunk rotations with Green physioball under calves; x20 alternating R/L ?  ?Wall ball roll up wall for shoulder flexion, Green physioball; x10, 5 sec ? ?Shoulder functional internal rotation AAROM with strap; x5 reviewed for performance at home to be completed with bath towel ?  ? ? ?*next visit* ?Seated swiss ball pelvic clock; x15 ea dir, seated on blue physioball - for graded lumbopelvic movement and exposure to multi-planar lumbar spine AROM ?  ?  ?   ?*not today* ?Seated Tband rows; standing with upright posture; 2x10 with Green Tband ?Shoulder flexion AAROM with dowel; 2x10, long-lever flexion, with 2-lb ankle weight strapped onto dowel ?Cervical retraction-extension; x10 - neck pain during, no change after ?Shoulder flexion AROM in supine; x5 (stopped due to pain) ?3-way shoulder isometrics; flexion, abd, extension; x10, 5 sec each - for analgesic effect produced by isometric mm contractions, reviewed for carryover to HEP ?Swiss ball seated marches; 2x10 alternating - with careful guarding from therapist in case of significant loss of balance ?Seated cervical retraction 2 x 10; patient reports mild remaining pain along L upper trapezius following repeated cervical retraction ?Seated W with Blue Tband; verbal cueing and demo for upright  posture; 2x10  ?Shoulder AAROM with dowel, external rotation; 2x10  ?Single-arm shoulder extension with shoulder flexion eccentric to increase ROM; x10 - stopped secondary to pain  ?Shoulder flexion LUE in supine; 1-lb Dbell; 2x12 ?Swiss ball bridge; Green ball; 2x10 ?Foam roll bilateral shoulder elevation; x12; heavy verbal cueing, demonstration, and tactile cueing for correct technique ?Pulleys, flexion and abduction ; x 1 Posterior pelvic tilt with alternating march; x10 alternating LLE/RLE ?Posterior pelvic tilt; 2x10 ?Lower trunk rotations; hooklying; x10 each direction, alternating ?Shoulder ER AAROM with dowel; x15  ?Swiss ball pelvic tilt, Green SWB; ?Swiss ball march,  Green SWB; ?Seated green tband rows 2 x 12 ?Reviewed technique for active median nerve glide within tolerated ROM ?Forward yoga ball roll outs x 15 ?Standing green tband GH extension x 10; pain so disconti

## 2021-07-30 ENCOUNTER — Ambulatory Visit: Admitting: Physical Therapy

## 2021-07-30 DIAGNOSIS — M5412 Radiculopathy, cervical region: Secondary | ICD-10-CM

## 2021-07-30 DIAGNOSIS — M542 Cervicalgia: Secondary | ICD-10-CM

## 2021-07-30 DIAGNOSIS — G8929 Other chronic pain: Secondary | ICD-10-CM

## 2021-07-30 DIAGNOSIS — M25512 Pain in left shoulder: Secondary | ICD-10-CM

## 2021-07-30 NOTE — Therapy (Signed)
?OUTPATIENT PHYSICAL THERAPY TREATMENT NOTE ? ? ?Patient Name: Jillian CoupeStacey June Ward ?MRN: [id number] ?DOB:April 24, 1965, 56 y.o., female ?Today's Date: 07/30/2021 ? ?PCP: Myrene BuddyGauger, Sarah Kathryn, NP ?REFERRING PROVIDER: Venetia NightYarbrough, Chester, MD ? ?END OF SESSION:  ? PT End of Session - 07/30/21 1506   ? ? Visit Number [id number]   ? Number of Visits 24   ? Date for PT Re-Evaluation 08/09/21   ? Authorization Type [employer], VL based on medical necessity   ? PT Start Time 1503   ? PT Stop Time 1545   ? PT Time Calculation (min) 42 min   ? Activity Tolerance Patient limited by pain   ? Behavior During [facility] for tasks assessed/performed   ? ?  ?  ? ?  ? ? ?Past Medical History:  ?Diagnosis Date  ? Anxiety   ? 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 Depression   ? Dyspnea   ? Hypertension   ? Nodule of right lung   ? Rib fracture   ? ?Past Surgical History:  ?Procedure Laterality Date  ? ANTERIOR CERVICAL DECOMP/DISCECTOMY FUSION N/A 10/18/2020  ? Procedure: C4-6 ANTERIOR CERVICAL DECOMPRESSION/DISCECTOMY FUSION 2 LEVELS;  Surgeon: Venetia NightYarbrough, Chester, MD;  Location: [facility] ORS;  Service: Neurosurgery;  Laterality: N/A;  schedule as 1st case  ? APPENDECTOMY    ? CESAREAN SECTION    ? COLONOSCOPY WITH PROPOFOL N/A 01/15/2016  ? Procedure: COLONOSCOPY WITH PROPOFOL;  Surgeon: Scot Junobert T Elliott, MD;  [facility];  Service: Endoscopy;  Laterality: N/A;  ? 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  ? Procedure: 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.;  Surgeon: Christena FlakePoggi, John J, MD;  Location: [facility] ORS;  Service: Orthopedics;  Laterality: Left;  ? HERNIA REPAIR  2005  ? ABDOMINAL  ? HERNIA REPAIR    ? UMBILICAL  ? ?Patient Active Problem List  ? Diagnosis Date Noted  ? Cervical radiculopathy 10/18/2020  ? ?  ?REFERRING DIAG: M54.12 (ICD-10-CM) - Radiculopathy, cervical region M54.42 (ICD-10-CM) - Lumbago with sciatica, left side M54.41 (ICD-10-CM) - Lumbago with sciatica, right side G89.29 (ICD-10-CM) -  Other chronic pain  ?  ?THERAPY DIAG:  ?Cervicalgia ?  ?Left shoulder pain, unspecified chronicity ?  ?Radiculopathy, cervical region ?  ?Chronic bilateral low back pain with left-sided sciatica ?  ?  ?PERTINENT HISTORY: Patient is a 56 year old female s/p C4-6 ACDF 10/18/20 with referral for cervical radiculopathy and lumbago with sciatica affecting bilateral lower extremities. Referring diagnoses includes chronic pain. Patient reports pain is primarily affecting L UT region and radiating down full length of L upper limb. Pt has (-) upper limb EMG (study completed 04/16/21). Pt has pain currently along L paracervical/UT region and down her L arm. Patient reports numbness/tingling down to her L hand/fingers without specific distribution to particular digits of L hand. Med hx including RA, anxiety, depression, HTN. Patient reports that she had more R upper quarter pain prior to surgery; more on L side presently. Patient is s/p C6-7 TFESI (03/26/21) without resolution of symptoms. No relief with L shoulder subacromial injection. 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 Patient reports disturbed sleep with change in position relieving one shoulder and then aggravating opposite side. Pt denies diplopia, dysarthria, dysphagia, vertigo. Pt reports some post-operative nausea - none presently. Patient reports losing weight after surgery due to not being able to eat (25 pounds following surgery last July). "Pain feels like 100" in regard to NPRS at worst. Patient reports constant pain.  Pt states she can reach with her R arm well at this time. Primary complaint is left upper quarter; she has comorbid chronic low back pain; back pain is worsened with sitting on lawn mower, prolonged standing, repetitive lumbar flexion. Pt reports she has to sit to relieve low back pain when completing standing work for extended period. Patient reports burning down to her anterior thighs. (+) shopping cart  sign. No lower body numbness/tingling.  ?  ?  ?PRECAUTIONS: None ?  ?  ?SUBJECTIVE:   Patient reports upping her medication recently as prescribed by Lenon Oms NP. She states she hasn't had to use Tylenol as much for pain management. She states her pain is still present, radiating down to her elbow. She reports numbness in her hands bilaterally. Patient reports compliance with her HEP.  ?  ?  ?PAIN:  ?Are you having pain? Yes: NPRS scale: 4-5/10 ?Pain location: L shoulder and radiating down to elbow  ?  ?  ?  ?  ?  ?  ?TODAY'S TREATMENT:  ?  ?  ?  ?  ?Therapeutic Exercise - for improved soft tissue flexibility and extensibility as needed for ROM, improved strength as needed to improve performance of CKC activities/functional movements, flexion-bias exercise for lumbar spine with lumbar paraspinal/truncal isometrics for analgesic effect and improved tolerance to moderate loading; graded movement for L arm and graded exposure to loading L upper limb as needed to improve tolerance to reaching and IADLs ? ? ?Upper body ergometer, 2 minutes forward, 2 minutes backward - for graded exposure to upper limb active motion, use of aerobics to mitigate symptoms related to central sensitization, subjective information also gathered during this time   ?  ?Cervical retraction with L sidebend, seated; x10 - no effect during ("pull" only on R paracervical region), decreased pain ("loosened up" after)  ?  ?Supine shoulder flexion AROM; 2x10 to full range tolerated (no significant pain today) ?  ?Sidelying shoulder abduction AROM, sidelying R; x10 ?  ?Modified dying bug; 2x10 alternating [moderate verbal and tactile cueing, demonstration to move opposite upper/lower limbs reciprocally], beginning in hooklying with alternating knee extension today ?  ?Lower trunk rotations with Green physioball under calves; x20 alternating R/L ?  ?Serratus slide with pillowcase around forearms, at wall; 2x10 with bilateral UEs ?  ?Seated swiss ball  pelvic clock; x15 ea dir, seated on blue physioball - for graded lumbopelvic movement and exposure to multi-planar lumbar spine AROM ?  ? Standing Tband rows; standing with upright posture; 2x12 with Green Tband ?   ? ?*not today* ?Shoulder functional internal rotation AAROM with strap; x5 reviewed for performance at home to be completed with bath towel ?Shoulder flexion AAROM with dowel; 2x10, long-lever flexion, with 2-lb ankle weight strapped onto dowel ?Cervical retraction-extension; x10 - neck pain during, no change after ?Shoulder flexion AROM in supine; x5 (stopped due to pain) ?3-way shoulder isometrics; flexion, abd, extension; x10, 5 sec each - for analgesic effect produced by isometric mm contractions, reviewed for carryover to HEP ?Swiss ball seated marches; 2x10 alternating - with careful guarding from therapist in case of significant loss of balance ?Seated cervical retraction 2 x 10; patient reports mild remaining pain along L upper trapezius following repeated cervical retraction ?Seated W with Blue Tband; verbal cueing and demo for upright posture; 2x10  ?Shoulder AAROM with dowel, external rotation; 2x10  ?Single-arm shoulder extension with shoulder flexion eccentric to increase ROM; x10 - stopped secondary to pain  ?Shoulder flexion LUE in  supine; 1-lb Dbell; 2x12 ?Swiss ball bridge; Green ball; 2x10 ?Foam roll bilateral shoulder elevation; x12; heavy verbal cueing, demonstration, and tactile cueing for correct technique ?Pulleys, flexion and abduction ; x 1 Posterior pelvic tilt with alternating march; x10 alternating LLE/RLE ?Posterior pelvic tilt; 2x10 ?Lower trunk rotations; hooklying; x10 each direction, alternating ?Shoulder ER AAROM with dowel; x15  ?Swiss ball pelvic tilt, Green SWB; ?Swiss ball march, Green SWB; ?Seated green tband rows 2 x 12 ?Reviewed technique for active median nerve glide within tolerated ROM ?Forward yoga ball roll outs x 15 ?Standing green tband GH extension x 10;  pain so discontinued  ?Supine bodyweight GH flexion x 10 ?  ?  ?  ?  ?  ?  ?  ?OBJECTIVE (measures taken are from most recent progress note unless otherwise stated) ?  ?07/25/21 ?No significant change in L

## 2021-08-02 ENCOUNTER — Ambulatory Visit: Admitting: Physical Therapy

## 2021-08-02 ENCOUNTER — Encounter: Payer: Self-pay | Admitting: Physical Therapy

## 2021-08-02 DIAGNOSIS — M5412 Radiculopathy, cervical region: Secondary | ICD-10-CM

## 2021-08-02 DIAGNOSIS — M542 Cervicalgia: Secondary | ICD-10-CM | POA: Diagnosis not present

## 2021-08-02 DIAGNOSIS — M25512 Pain in left shoulder: Secondary | ICD-10-CM

## 2021-08-02 DIAGNOSIS — G8929 Other chronic pain: Secondary | ICD-10-CM

## 2021-08-02 NOTE — Therapy (Signed)
?OUTPATIENT PHYSICAL THERAPY TREATMENT NOTE AND GOAL UPDATE ? ? ?Patient Name: Jillian Ward ?MRN: [id number] ?DOB:[birth year], 56 y.o., female ?Today's Date: 08/02/2021 ? ?PCP: Sallee Lange, NP ?REFERRING PROVIDER: Meade Maw, MD ? ?END OF SESSION:  ? PT End of Session - 08/05/21 0600   ? ? Visit Number 24   ? Number of Visits 24   ? Date for PT Re-Evaluation 08/09/21   ? Authorization Type [employer], VL based on medical necessity   ? PT Start Time 1429   ? PT Stop Time 1513   ? PT Time Calculation (min) 44 min   ? Activity Tolerance Patient limited by pain   ? Behavior During Therapy [facility] for tasks assessed/performed   ? ?  ?  ? ?  ? ? ?Past Medical History:  ?Diagnosis Date  ? Anxiety   ? 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 Depression   ? Dyspnea   ? Hypertension   ? Nodule of right lung   ? Rib fracture   ? ?Past Surgical History:  ?Procedure Laterality Date  ? ANTERIOR CERVICAL DECOMP/DISCECTOMY FUSION N/A 10/18/2020  ? Procedure: C4-6 ANTERIOR CERVICAL DECOMPRESSION/DISCECTOMY FUSION 2 LEVELS;  Surgeon: Meade Maw, MD;  Location: [facility] ORS;  Service: Neurosurgery;  Laterality: N/A;  schedule as 1st case  ? APPENDECTOMY    ? CESAREAN SECTION    ? COLONOSCOPY WITH PROPOFOL N/A 01/15/2016  ? Procedure: COLONOSCOPY WITH PROPOFOL;  Surgeon: Manya Silvas, MD;  Location: [facility];  Service: Endoscopy;  Laterality: N/A;  ? 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  ? 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 HERNIA REPAIR  2005  ? ABDOMINAL  ? HERNIA REPAIR    ? UMBILICAL  ? ?Patient Active Problem List  ? Diagnosis Date Noted  ? Cervical radiculopathy 10/18/2020  ? ? ? ?REFERRING DIAG: M54.12 (ICD-10-CM) - Radiculopathy, cervical region M54.42 (ICD-10-CM) - Lumbago with sciatica, left side M54.41 (ICD-10-CM) - Lumbago with sciatica, right side G89.29  (ICD-10-CM) - Other chronic pain  ?  ?THERAPY DIAG:  ?Cervicalgia ?  ?Left shoulder pain, unspecified chronicity ?  ?Radiculopathy, cervical region ?  ?Chronic bilateral low back pain with left-sided sciatica ?  ?  ?PERTINENT HISTORY: Patient is a 56 year old female s/p C4-6 ACDF 10/18/20 with referral for cervical radiculopathy and lumbago with sciatica affecting bilateral lower extremities. Referring diagnoses includes chronic pain. Patient reports pain is primarily affecting L UT region and radiating down full length of L upper limb. Pt has (-) upper limb EMG (study completed 04/16/21). Pt has pain currently along L paracervical/UT region and down her L arm. Patient reports numbness/tingling down to her L hand/fingers without specific distribution to particular digits of L hand. Med hx including RA, anxiety, depression, HTN. Patient reports that she had more R upper quarter pain prior to surgery; more on L side presently. Patient is s/p C6-7 TFESI (03/26/21) without resolution of symptoms. No relief with L shoulder subacromial injection. 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 Patient reports disturbed sleep with change in position relieving one shoulder and then aggravating opposite side. Pt denies diplopia, dysarthria, dysphagia, vertigo. Pt reports some post-operative nausea - none presently. Patient reports losing weight after surgery due to not being able to eat (25 pounds following surgery last July). "Pain feels like 100" in regard to NPRS at worst. Patient  reports constant pain. Pt states she can reach with her R arm well at this time. Primary complaint is left upper quarter; she has comorbid chronic low back pain; back pain is worsened with sitting on lawn mower, prolonged standing, repetitive lumbar flexion. Pt reports she has to sit to relieve low back pain when completing standing work for extended period. Patient reports burning down to her anterior thighs. (+)  shopping cart sign. No lower body numbness/tingling.  ?  ?  ?PRECAUTIONS: None ?  ?  ?SUBJECTIVE:   Patient reports feeling notably better at this time. She states she cannot put significant direct pressure on her L upper limb. She states she has better ROM of her L upper limb. Pt reports doing better with washing hair. Patient reports that recent change in meds with increase in pregabalin and gabapentin has helped. 70% SANE score at this time. Patient reports feeling restless during the night. Pt reports tolerating standing activity up to 5 minutes e.g. completing laundry and completing dishes. Pt feels she is ready to continue with exercises and strategies learned in PT on her own at this time. ?  ?  ?PAIN:  ?Are you having pain? Yes: NPRS scale: 4/10 ?Pain location: L shoulder and radiating down to elbow/proximal forearm  ?  ?  ?  ?  ?  ?  ?TODAY'S TREATMENT:  ?  ?  ?  ?  ?Therapeutic Exercise - for improved soft tissue flexibility and extensibility as needed for ROM, improved strength as needed to improve performance of CKC activities/functional movements, flexion-bias exercise for lumbar spine with lumbar paraspinal/truncal isometrics for analgesic effect and improved tolerance to moderate loading; graded movement for L arm and graded exposure to loading L upper limb as needed to improve tolerance to reaching and IADLs ?  ?*Goal update performed  ? ?Upper body ergometer, 2 minutes forward, 2 minutes backward - for graded exposure to upper limb active motion, use of aerobics to mitigate symptoms related to central sensitization, subjective information also gathered during this time   ?  ?Cervical retraction with L sidebend, seated; x10 - no effect during ("pull" only on R paracervical region), decreased pain  ?  ?Supine shoulder flexion AROM; 2x10 to full range tolerated (no significant pain today) ?  ?Modified dying bug; 2x10 alternating [moderate verbal and tactile cueing, demonstration to move opposite  upper/lower limbs reciprocally], beginning in hooklying with alternating knee extension today ? ? Standing Tband rows; standing with upright posture; 2x12 with Green Tband ?   ?PATIENT EDUCATION: Updated and reviewed current goals and patient's home exercise program. Discussed at length MDT parameters and change in parameters with maintenance phase of MDT. Discussed transition to independent home exercise/self-management.  ?  ? ?*not today* ?Serratus slide with pillowcase around forearms, at wall; 2x10 with bilateral UEs ?Seated swiss ball pelvic clock; x15 ea dir, seated on blue physioball - for graded lumbopelvic movement and exposure to multi-planar lumbar spine AROM ?Shoulder functional internal rotation AAROM with strap; x5 reviewed for performance at home to be completed with bath towel ?Shoulder flexion AAROM with dowel; 2x10, long-lever flexion, with 2-lb ankle weight strapped onto dowel ?Cervical retraction-extension; x10 - neck pain during, no change after ?Shoulder flexion AROM in supine; x5 (stopped due to pain) ?3-way shoulder isometrics; flexion, abd, extension; x10, 5 sec each - for analgesic effect produced by isometric mm contractions, reviewed for carryover to HEP ?Swiss ball seated marches; 2x10 alternating - with careful guarding from therapist in case of significant  loss of balance ?Seated cervical retraction 2 x 10; patient reports mild remaining pain along L upper trapezius following repeated cervical retraction ?Seated W with Blue Tband; verbal cueing and demo for upright posture; 2x10  ?Shoulder AAROM with dowel, external rotation; 2x10  ?Single-arm shoulder extension with shoulder flexion eccentric to increase ROM; x10 - stopped secondary to pain  ?Shoulder flexion LUE in supine; 1-lb Dbell; 2x12 ?Swiss ball bridge; Green ball; 2x10 ?Foam roll bilateral shoulder elevation; x12; heavy verbal cueing, demonstration, and tactile cueing for correct technique ?Pulleys, flexion and abduction ; x 1  Posterior pelvic tilt with alternating march; x10 alternating LLE/RLE ?Posterior pelvic tilt; 2x10 ?Lower trunk rotations; hooklying; x10 each direction, alternating ?Shoulder ER AAROM with dowel; x15  ?Swiss

## 2022-01-29 ENCOUNTER — Other Ambulatory Visit: Payer: Self-pay | Admitting: Nurse Practitioner

## 2022-01-29 DIAGNOSIS — Z1231 Encounter for screening mammogram for malignant neoplasm of breast: Secondary | ICD-10-CM

## 2022-02-21 ENCOUNTER — Ambulatory Visit
Admission: RE | Admit: 2022-02-21 | Discharge: 2022-02-21 | Disposition: A | Discharge status: Home / Self Care | Source: Ambulatory Visit | Attending: Nurse Practitioner | Admitting: Nurse Practitioner

## 2022-02-21 DIAGNOSIS — Z1231 Encounter for screening mammogram for malignant neoplasm of breast: Secondary | ICD-10-CM | POA: Insufficient documentation

## 2022-03-06 ENCOUNTER — Inpatient Hospital Stay
Admission: RE | Admit: 2022-03-06 | Discharge: 2022-03-06 | Disposition: A | Discharge status: Home / Self Care | Payer: Self-pay | Source: Ambulatory Visit | Attending: *Deleted | Admitting: *Deleted

## 2022-03-06 ENCOUNTER — Other Ambulatory Visit: Payer: Self-pay | Admitting: *Deleted

## 2022-03-06 ENCOUNTER — Other Ambulatory Visit: Payer: Self-pay

## 2022-03-06 DIAGNOSIS — Z122 Encounter for screening for malignant neoplasm of respiratory organs: Secondary | ICD-10-CM

## 2022-03-06 DIAGNOSIS — Z1231 Encounter for screening mammogram for malignant neoplasm of breast: Secondary | ICD-10-CM

## 2022-03-06 DIAGNOSIS — F17210 Nicotine dependence, cigarettes, uncomplicated: Secondary | ICD-10-CM

## 2022-03-06 DIAGNOSIS — Z87891 Personal history of nicotine dependence: Secondary | ICD-10-CM

## 2022-04-24 ENCOUNTER — Ambulatory Visit (INDEPENDENT_AMBULATORY_CARE_PROVIDER_SITE_OTHER): Admitting: Acute Care

## 2022-04-24 ENCOUNTER — Encounter: Payer: Self-pay | Admitting: Acute Care

## 2022-04-24 DIAGNOSIS — F17210 Nicotine dependence, cigarettes, uncomplicated: Secondary | ICD-10-CM | POA: Diagnosis not present

## 2022-04-24 NOTE — Patient Instructions (Signed)
Thank you for participating in the [place] [facility]. It was our pleasure to meet you today. We will call you with the results of your scan within the next few days. Your scan will be assigned a Lung RADS category score by the physicians reading the scans.  This Lung RADS score determines follow up scanning.  See below for description of categories, and follow up screening recommendations. We will be in touch to schedule your follow up screening annually or based on recommendations of our providers. We will fax a copy of your scan results to your Primary Care Physician, or the physician who referred you to the program, to ensure they have the results. Please call the office if you have any questions or concerns regarding your scanning experience or results.  Our office number is [phone]. Please speak with Denise Phelps, RN. , or  Denise Buckner RN, They are  our Lung Cancer Screening RN.'s If They are unavailable when you call, Please leave a message on the voice mail. We will return your call at our earliest convenience.This voice mail is monitored several times a day.  Remember, if your scan is normal, we will scan you annually as long as you continue to meet the criteria for the program. (Age 50-80, Current smoker or smoker who has quit within the last 15 years). If you are a smoker, remember, quitting is the single most powerful action that you can take to decrease your risk of lung cancer and other pulmonary, breathing related problems. We know quitting is hard, and we are here to help.  Please let us know if there is anything we can do to help you meet your goal of quitting. If you are a former smoker, congratulations. We are proud of you! Remain smoke free! Remember you can refer friends or family members through the number above.  We will screen them to make sure they meet criteria for the program. Thank you for helping us take better care of you by  participating in Lung Screening.  You can receive free nicotine replacement therapy ( patches, gum or mints) by calling 1-800-QUIT NOW. Please call so we can get you on the path to becoming  a non-smoker. I know it is hard, but you can do this!  Lung RADS Categories:  Lung RADS 1: no nodules or definitely non-concerning nodules.  Recommendation is for a repeat annual scan in 12 months.  Lung RADS 2:  nodules that are non-concerning in appearance and behavior with a very low likelihood of becoming an active cancer. Recommendation is for a repeat annual scan in 12 months.  Lung RADS 3: nodules that are probably non-concerning , includes nodules with a low likelihood of becoming an active cancer.  Recommendation is for a 6-month repeat screening scan. Often noted after an upper respiratory illness. We will be in touch to make sure you have no questions, and to schedule your 6-month scan.  Lung RADS 4 A: nodules with concerning findings, recommendation is most often for a follow up scan in 3 months or additional testing based on our provider's assessment of the scan. We will be in touch to make sure you have no questions and to schedule the recommended 3 month follow up scan.  Lung RADS 4 B:  indicates findings that are concerning. We will be in touch with you to schedule additional diagnostic testing based on our provider's  assessment of the scan.  Other options for assistance in smoking cessation (   As covered by your insurance benefits)  Hypnosis for smoking cessation  [employer]. [phone]  Acupuncture for smoking cessation  [employer] [phone]

## 2022-04-24 NOTE — Progress Notes (Signed)
Virtual Visit via Telephone Note  I connected with Jillian Ward on 04/24/22 at 11:00 AM EST by telephone and verified that I am speaking with the correct person using two identifiers.  Location: Patient:  At home Provider:  [place], [place], [place], [address]    I discussed the limitations, risks, security and privacy concerns of performing an evaluation and management service by telephone and the availability of in person appointments. I also discussed with the patient that there may be a patient responsible charge related to this service. The patient expressed understanding and agreed to proceed.   Shared Decision Making Visit Lung Cancer Screening Program [phone])   Eligibility: Age 57 y.o. Pack Years Smoking History Calculation 42 pack year smoking history (# packs/per year x # years smoked) Recent History of coughing up blood  no Unexplained weight loss? no ( >Than 15 pounds within the last 6 months ) Prior History Lung / other cancer no (Diagnosis within the last 5 years already requiring surveillance chest CT Scans). Smoking Status Current Smoker Former Smokers: Years since quit:  NA  Quit Date:  NA  Visit Components: Discussion included one or more decision making aids. yes Discussion included risk/benefits of screening. yes Discussion included potential follow up diagnostic testing for abnormal scans. yes Discussion included meaning and risk of over diagnosis. yes Discussion included meaning and risk of False Positives. yes Discussion included meaning of total radiation exposure. yes  Counseling Included: Importance of adherence to annual lung cancer LDCT screening. yes Impact of comorbidities on ability to participate in the program. yes Ability and willingness to under diagnostic treatment. yes  Smoking Cessation Counseling: Current Smokers:  Discussed importance of smoking cessation. yes Information about tobacco cessation classes and  interventions provided to patient. yes Patient provided with "ticket" for LDCT Scan. yes Symptomatic Patient. no  Counseling NA Diagnosis Code: Tobacco Use Z72.0 Asymptomatic Patient yes  Counseling (Intermediate counseling: > three minutes counseling) [id number] Former Smokers:  Discussed the importance of maintaining cigarette abstinence. yes Diagnosis Code: Personal History of Nicotine Dependence. Y81.448 Information about tobacco cessation classes and interventions provided to patient. Yes Patient provided with "ticket" for LDCT Scan. yes Written Order for Lung Cancer Screening with LDCT placed in Epic. Yes (CT Chest Lung Cancer Screening Low Dose W/O CM) [id number] Z12.2-Screening of respiratory organs Z87.891-Personal history of nicotine dependence  I have spent 25 minutes of face to face/ virtual visit   time with  Jillian Ward discussing the risks and benefits of lung cancer screening. We viewed / discussed a power point together that explained in detail the above noted topics. We paused at intervals to allow for questions to be asked and answered to ensure understanding.We discussed that the single most powerful action that she can take to decrease her risk of developing lung cancer is to quit smoking. We discussed whether or not she is ready to commit to setting a quit date. We discussed options for tools to aid in quitting smoking including nicotine replacement therapy, non-nicotine medications, support groups, Quit Smart classes, and behavior modification. We discussed that often times setting smaller, more achievable goals, such as eliminating 1 cigarette a day for a week and then 2 cigarettes a day for a week can be helpful in slowly decreasing the number of cigarettes smoked. This allows for a sense of accomplishment as well as providing a clinical benefit. I provided  her  with smoking cessation  information  with contact information for community resources, classes,  free nicotine replacement  therapy, and access to mobile apps, text messaging, and on-line smoking cessation help. I have also provided  her  the office contact information in the event she needs to contact me, or the screening staff. We discussed the time and location of the scan, and that either Doroteo Glassman RN, Joella Prince, RN  or I will call / send a letter with the results within 24-72 hours of receiving them. The patient verbalized understanding of all of  the above and had no further questions upon leaving the office. They have my contact information in the event they have any further questions.  I spent 3 minutes counseling on smoking cessation and the health risks of continued tobacco abuse.  I explained to the patient that there has been a high incidence of coronary artery disease noted on these exams. I explained that this is a non-gated exam therefore degree or severity cannot be determined. This patient is not on statin therapy. I have asked the patient to follow-up with their PCP regarding any incidental finding of coronary artery disease and management with diet or medication as their PCP  feels is clinically indicated. The patient verbalized understanding of the above and had no further questions upon completion of the visit.      Magdalen Spatz, NP 04/24/2022

## 2022-04-25 ENCOUNTER — Ambulatory Visit
Admission: RE | Admit: 2022-04-25 | Discharge: 2022-04-25 | Disposition: A | Discharge status: Home / Self Care | Source: Ambulatory Visit | Attending: Acute Care | Admitting: Acute Care

## 2022-04-25 DIAGNOSIS — J439 Emphysema, unspecified: Secondary | ICD-10-CM | POA: Insufficient documentation

## 2022-04-25 DIAGNOSIS — Z122 Encounter for screening for malignant neoplasm of respiratory organs: Secondary | ICD-10-CM | POA: Diagnosis present

## 2022-04-25 DIAGNOSIS — I700 Atherosclerosis of aorta: Secondary | ICD-10-CM | POA: Insufficient documentation

## 2022-04-25 DIAGNOSIS — F17210 Nicotine dependence, cigarettes, uncomplicated: Secondary | ICD-10-CM

## 2022-04-25 DIAGNOSIS — R911 Solitary pulmonary nodule: Secondary | ICD-10-CM | POA: Diagnosis not present

## 2022-04-25 DIAGNOSIS — S2231XA Fracture of one rib, right side, initial encounter for closed fracture: Secondary | ICD-10-CM | POA: Diagnosis not present

## 2022-04-25 DIAGNOSIS — X58XXXA Exposure to other specified factors, initial encounter: Secondary | ICD-10-CM | POA: Insufficient documentation

## 2022-04-25 DIAGNOSIS — Z87891 Personal history of nicotine dependence: Secondary | ICD-10-CM

## 2022-04-26 ENCOUNTER — Telehealth: Payer: Self-pay | Admitting: Acute Care

## 2022-04-26 DIAGNOSIS — R911 Solitary pulmonary nodule: Secondary | ICD-10-CM

## 2022-04-26 NOTE — Telephone Encounter (Signed)
Tiffany has call report for patient. Indicates it was not urgent.  CB# [phone]

## 2022-04-26 NOTE — Telephone Encounter (Signed)
Received call report from Protection with [facility] on patient's lung cancer screen CT done on 04/25/22. Sarah, please review the result/impression copied below:  IMPRESSION: 1. Subpleural nodule of the left upper lobe with associated pleural thickening measuring 14.2 mm in mean diameter, new when compared with prior CT. Lung-RADS 4B, suspicious. Additional imaging evaluation or consultation with Pulmonology or Thoracic Surgery recommended. 2. Right pleural thickening and round atelectasis associated chronic right rib fracture, likely sequela of prior hemothorax. 3. Aortic Atherosclerosis (ICD10-I70.0) and Emphysema (ICD10-J43.9).  Please advise, thank you.   **Also routing to lung nodule pool**

## 2022-04-26 NOTE — Telephone Encounter (Signed)
I have attempted to call the patient with the results of their  Low Dose CT Chest Lung cancer screening scan. There was no answer. I have left a HIPPA compliant VM requesting the patient call the office for the scan results. I included the office contact information in the message. We will await her return call. If no return call we will continue to call until patient is contacted.   LR 4B Needs a PET scan and then follow up with either Dr. Darnell Level or Dygayli.

## 2022-04-29 NOTE — Telephone Encounter (Signed)
Spoke with pt and advised of nodule seen on Chest CT. I explained that we would like to schedule a PET scan to look at this nodule further since we dont have a recent Chest CT to compare it to. Pt verbalized understanding and is aware that she will get a call to schedule the PET scan and then a call to schedule pt with Pulmonary in [place] to follow up after PET scan.  PET scan ordered. CT results sent to PCP with follow up plan included.

## 2022-04-29 NOTE — Telephone Encounter (Signed)
Will call to schedule appt with provider after PET has been scheduled.

## 2022-04-29 NOTE — Telephone Encounter (Signed)
Pt returned call and left voicemail. Attempted to contact pt but had to leave another message for her to return our call.

## 2022-04-29 NOTE — Telephone Encounter (Signed)
Spoke to patient and scheduled appt 05/15/2022 a 3:00. Directions provided. Patient voiced her understanding.  Nothing further needed.

## 2022-04-30 ENCOUNTER — Telehealth: Payer: Self-pay | Admitting: Acute Care

## 2022-05-13 ENCOUNTER — Encounter
Admission: RE | Admit: 2022-05-13 | Discharge: 2022-05-13 | Disposition: A | Discharge status: Home / Self Care | Source: Ambulatory Visit | Attending: Acute Care | Admitting: Acute Care

## 2022-05-13 DIAGNOSIS — R911 Solitary pulmonary nodule: Secondary | ICD-10-CM | POA: Diagnosis not present

## 2022-05-13 LAB — GLUCOSE, CAPILLARY: Glucose-Capillary: 72 mg/dL (ref 70–99)

## 2022-05-13 MED ORDER — FLUDEOXYGLUCOSE F - 18 (FDG) INJECTION
6.8000 | Freq: Once | INTRAVENOUS | Status: AC | PRN
  Administered 2022-05-13: 7.4 via INTRAVENOUS

## 2022-05-15 ENCOUNTER — Ambulatory Visit: Admitting: Student in an Organized Health Care Education/Training Program

## 2022-05-15 ENCOUNTER — Encounter: Payer: Self-pay | Admitting: Student in an Organized Health Care Education/Training Program

## 2022-05-15 VITALS — BP 126/76 | HR 100 | Temp 98.2°F | Ht 61.0 in | Wt 117.6 lb

## 2022-05-15 DIAGNOSIS — R911 Solitary pulmonary nodule: Secondary | ICD-10-CM | POA: Diagnosis not present

## 2022-05-15 NOTE — Progress Notes (Signed)
Synopsis: Referred in for pulmonary nodule by Myrene Buddy, *  Assessment & Plan:   1. Lung nodule  Nodule Location: LUL Nodule Size: 1.4 cm x 1 cm Nodule Spiculation: No Associated Lymphadenopathy: No Smoking Status (current) and pack years (42) Extrathoracic cancer > 5 years prior (no) SPN malignancy risk score [facility]): 21 %risk of malignancy ECOG: 0  The patient is here to discuss their imaging abnormalities which include a LUL nodule. The differential for this includes malignancy, rheumatoid nodules, and infectious etiologies given her immune suppression. She would benefit from a biopsy for sampling. Given its location, I believe a transthoracic approach would be best, and have placed a consult to IR for consideration of a CT guided biopsy. Should this not be feasible, we would arrange for robotic assisted navigational bronchoscopy.  Recommendations: CT guided biopsy Tissue to be sent for pathology, bacterial culture, fungal culture, and AFB culture Ambulatory referral to Interventional Radiology   Return in about 4 weeks (around 06/12/2022).  I spent 50 minutes caring for this patient today, including preparing to see the patient, obtaining a medical history , reviewing a separately obtained history, performing a medically appropriate examination and/or evaluation, counseling and educating the patient/family/caregiver, ordering medications, tests, or procedures, documenting clinical information in the electronic health record, and independently interpreting results (not separately reported/billed) and communicating results to the patient/family/caregiver  Raechel Chute, MD [facility] 05/15/2022 3:58 PM    End of visit medications:  No orders of the defined types were placed in this encounter.    Current Outpatient Medications:    acetaminophen (TYLENOL) 325 MG tablet, Take 2 tablets (650 mg total) by mouth every 4 (four) hours as  needed for mild pain ((score 1 to 3) or temp > 100.5)., Disp: , Rfl:    Adalimumab 40 MG/0.8ML PSKT, Inject 40 mg into the skin See admin instructions. Every 10 days, Disp: , Rfl:    amLODipine (NORVASC) 5 MG tablet, Take 5 mg by mouth daily. , Disp: , Rfl:    buPROPion (WELLBUTRIN XL) 300 MG 24 hr tablet, Take 300 mg by mouth daily., Disp: , Rfl:    celecoxib (CELEBREX) 200 MG capsule, Take 1 capsule (200 mg total) by mouth every 12 (twelve) hours., Disp: 14 capsule, Rfl: 0   folic acid (FOLVITE) 1 MG tablet, Take 1 mg by mouth daily., Disp: , Rfl:    gabapentin (NEURONTIN) 100 MG capsule, Take 100 mg by mouth daily., Disp: , Rfl:    methocarbamol (ROBAXIN) 500 MG tablet, Take 1 tablet (500 mg total) by mouth every 6 (six) hours as needed for muscle spasms., Disp: 60 tablet, Rfl: 0   Methotrexate, PF, 10 MG/0.2ML SOAJ, Inject 10 mg into the skin every Monday., Disp: , Rfl:    phenazopyridine (PYRIDIUM) 100 MG tablet, Take 1 tablet (100 mg total) by mouth 3 (three) times daily as needed for pain., Disp: 10 tablet, Rfl: 0   pregabalin (LYRICA) 150 MG capsule, Take 150 mg by mouth 2 (two) times daily., Disp: , Rfl:    metoCLOPramide (REGLAN) 10 MG tablet, Take 1 tablet (10 mg total) by mouth every 6 (six) hours as needed for nausea., Disp: 12 tablet, Rfl: 0   Subjective:   PATIENT ID: Jillian Ward GENDER: female DOB: 11-04-1965, MRN: [id number]  Chief Complaint  Patient presents with   pulmonary consult    PET. C/o with clear to yellow sputum.     HPI  Patient is a  pleasant 57 year old female presenting to clinic for the evaluation of a pulmonary nodule.  Patient was recently enrolled in the lung cancer screening program and underwent a low dose chest CT that showed a LUL peripheral pleural based nodule. She underwent a PET/CT that showed this to be mildly PET avid. Patient is referred to pulmonary for discussion.  She has no symptoms, and denies any shortness of breath, chest  tightness or wheezing. She has an occasional non-productive cough. No hemoptysis, no chills, no night sweats, and no unintentional weight loss. She has a history of rheumatoid arthritis for which she is on [place] and methotrexate. She does not have any rashes, worsening joint pain, GI or GU symptoms.  She has a history of smoking, having smoked a pack a day for 42 years. 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 She did live on [address] as her husband was in the [employer]. She spent three years living in [place] with him.  Ancillary information including prior medications, full medical/surgical/family/social histories, and PFTs (when available) are listed below and have been reviewed.   Review of Systems  Constitutional:  Negative for chills, diaphoresis, fever, malaise/fatigue and weight loss.  Respiratory:  Positive for cough. Negative for hemoptysis, sputum production, shortness of breath and wheezing.   Cardiovascular:  Negative for chest pain and PND.  Skin:  Negative for rash.     Objective:   Vitals:   05/15/22 1457  BP: 126/76  Pulse: 100  Temp: 98.2 F (36.8 C)  TempSrc: Temporal  SpO2: 95%  Weight: 117 lb 9.6 oz (53.3 kg)  Height: 5\' 1"  (1.549 m)   95% on RA  BMI Readings from Last 3 Encounters:  05/15/22 22.22 kg/m  11/10/20 24.75 kg/m  10/18/20 25.06 kg/m   Wt Readings from Last 3 Encounters:  05/15/22 117 lb 9.6 oz (53.3 kg)  11/10/20 131 lb (59.4 kg)  10/18/20 137 lb (62.1 kg)    Physical Exam Constitutional:      General: She is not in acute distress.    Appearance: Normal appearance. She is not ill-appearing.  HENT:     Head: Normocephalic.     Mouth/Throat:     Mouth: Mucous membranes are moist.  Cardiovascular:     Rate and Rhythm: Normal rate and regular rhythm.     Pulses: Normal pulses.     Heart sounds: Normal heart sounds.  Pulmonary:     Effort: Pulmonary effort is normal.     Breath sounds: Normal breath sounds.   Abdominal:     Palpations: Abdomen is soft.  Neurological:     General: No focal deficit present.     Mental Status: She is alert and oriented to person, place, and time. Mental status is at baseline.     Ancillary Information    Past Medical History:  Diagnosis Date   Anxiety    Arthritis    rheumatoid   Depression    Dyspnea    Hypertension    Nodule of right lung    Rib fracture      No family history on file.   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.;  Surgeon: Corky Mull, MD;  Location: [facility] ORS;  Service: Orthopedics;  Laterality: Left;   HERNIA REPAIR  2005   ABDOMINAL   HERNIA REPAIR     UMBILICAL    Social History   Socioeconomic History   Marital status: Married    Spouse name: Not on file   Number of children: Not on file   Years of education: Not on file   Highest education level: Not on file  Occupational History   Not on file  Tobacco Use   Smoking status: Heavy Smoker    Packs/day: 1.00    Years: 42.00    Total pack years: 42.00    Types: Cigarettes   Smokeless tobacco: Never   Tobacco comments:    15 cigarettes daily-05/15/2022  Substance and Sexual Activity   Alcohol use: No    Comment: socially    Drug use: No   Sexual activity: Not on file  Other Topics Concern   Not on file  Social History Narrative   Not on file    Social Determinants of Health   Financial Resource Strain: Not on file  Food Insecurity: Not on file  Transportation Needs: Not on file  Physical Activity: Not on file  Stress: Not on file  Social Connections: Not on file  Intimate Partner Violence: Not on file     Allergies  Allergen Reactions   Penicillins Hives   Iodinated Contrast Media Hives and Rash     CBC    Component Value Date/Time   WBC 15.4 (H) 11/10/2020 0833   RBC 4.93 11/10/2020 0833   HGB 15.1 (H) 11/10/2020 0833   HCT 42.7 11/10/2020 0833   PLT 350 11/10/2020 0833   MCV 86.6 11/10/2020 0833   MCH 30.6 11/10/2020 0833   MCHC 35.4 11/10/2020 0833   RDW 12.0 11/10/2020 0833    Pulmonary Functions Testing Results:     No data to display          Outpatient Medications Prior to Visit  Medication Sig Dispense Refill   acetaminophen (TYLENOL) 325 MG tablet Take 2 tablets (650 mg total) by mouth every 4 (four) hours as needed for mild pain ((score 1 to 3) or temp > 100.5).     Adalimumab 40 MG/0.8ML PSKT Inject 40 mg into the skin See admin instructions. Every 10 days     amLODipine (NORVASC) 5 MG tablet Take 5 mg by mouth daily.      buPROPion (WELLBUTRIN XL) 300 MG 24 hr tablet Take 300 mg by mouth daily.     celecoxib (CELEBREX) 200 MG capsule Take 1 capsule (200 mg total) by mouth every 12 (twelve) hours. 14 capsule 0   folic acid (FOLVITE) 1 MG tablet Take 1 mg by mouth daily.     gabapentin (NEURONTIN) 100 MG capsule Take 100 mg by mouth daily.     methocarbamol (ROBAXIN) 500 MG tablet Take 1 tablet (500 mg total) by mouth every 6 (six) hours as needed for muscle spasms. 60 tablet 0   Methotrexate, PF, 10 MG/0.2ML SOAJ Inject 10 mg into the skin every Monday.     phenazopyridine (PYRIDIUM) 100 MG tablet Take 1 tablet (100 mg total) by mouth 3 (three) times daily  as needed for pain. 10 tablet 0   pregabalin (LYRICA) 150 MG capsule Take 150 mg by mouth 2 (two) times daily.     metoCLOPramide  (REGLAN) 10 MG tablet Take 1 tablet (10 mg total) by mouth every 6 (six) hours as needed for nausea. 12 tablet 0   No facility-administered medications prior to visit.

## 2022-05-15 NOTE — Addendum Note (Signed)
Addended byArmando Reichert on: 05/15/2022 04:57 PM   Modules accepted: Orders

## 2022-05-16 NOTE — Progress Notes (Signed)
Armando Reichert, MD  Donita Brooks D Yes, please. Thank you       Previous Messages    ----- Message ----- From: Donita Brooks D Sent: 05/16/2022  10:15 AM EST To: Armando Reichert, MD Subject: RE: CT BX                                      So  we need to cancel this request correct??   Thanks [place]

## 2022-05-16 NOTE — Progress Notes (Signed)
Armando Reichert, MD  Donita Brooks D Cc: Magdalen Spatz, NP; Greggory Keen, MD; Juliet Rude, MD Thank you for looking into the case. I'll proceed with robotic nav.       Previous Messages    ----- Message ----- From: Greggory Keen, MD Sent: 05/16/2022   9:49 AM EST To: Magdalen Spatz, NP; Riley Lam; * Subject: RE: CT BX                                      Far lateral and high location for CT bx.  Should consider nav bronch or just surgical resection  Could discuss at next week thoracic conference.    Thanks TS

## 2022-05-27 ENCOUNTER — Other Ambulatory Visit: Payer: Self-pay | Admitting: Student in an Organized Health Care Education/Training Program

## 2022-05-27 ENCOUNTER — Telehealth: Payer: Self-pay | Admitting: Student in an Organized Health Care Education/Training Program

## 2022-05-27 DIAGNOSIS — R911 Solitary pulmonary nodule: Secondary | ICD-10-CM

## 2022-05-27 NOTE — Telephone Encounter (Signed)
I have sent a 2nd message to Jillian Ward about this issue

## 2022-05-27 NOTE — Telephone Encounter (Signed)
Jillian Ward, can you assist with this? Thank you.

## 2022-05-27 NOTE — Telephone Encounter (Signed)
Per Dr. Genia Harold via epic secure chat- patient is scheduled for [facility] bronch at [facility] on 06/12/22. She will need CT prior to scan. Dr. Genia Harold has spoken to patient and notified her of bronch date.  Will hold message to ensure f/u on CT.

## 2022-05-28 ENCOUNTER — Encounter: Payer: Self-pay | Admitting: Student in an Organized Health Care Education/Training Program

## 2022-05-28 NOTE — Telephone Encounter (Signed)
Spoke to patient and notified her of date/time of bronch and CT. She is aware to arrive at 6:30 for bronch and to expect a letter with written instructions.  Nothing further needed.

## 2022-05-29 NOTE — Progress Notes (Signed)
The CT has been scheduled on 35/24 @ 10:303am at [facility] patient is aware of the appt

## 2022-06-10 ENCOUNTER — Other Ambulatory Visit: Payer: Self-pay

## 2022-06-10 ENCOUNTER — Encounter (HOSPITAL_COMMUNITY): Payer: Self-pay | Admitting: Student in an Organized Health Care Education/Training Program

## 2022-06-10 NOTE — Progress Notes (Signed)
PCP - Sallee Lange, NP Rheumatology - Evette Cristal, MD  Chest x-ray - DOS EKG - DOS  COVID TEST- DOS  Anesthesia review: N  Patient verbally denies any shortness of breath, fever, cough and chest pain during phone call   -------------  SDW INSTRUCTIONS given:  Your procedure is scheduled on Wednesday, 06/12/22.  Report to [facility] Main Entrance "A" at 0600 A.M., and check in at the Admitting office.  Call this number if you have problems the morning of surgery:  [phone]   Remember:  Do not eat or drink after midnight the night before your surgery    Take these medicines the morning of surgery with A SIP OF WATER  amLODipine (NORVASC)  buPROPion (WELLBUTRIN XL)  pregabalin (LYRICA)  acetaminophen (TYLENOL)-if needed  As of today, STOP taking any Aspirin (unless otherwise instructed by your surgeon) Aleve, Naproxen, Ibuprofen, Motrin, Advil, Goody's, BC's, all herbal medications, fish oil, and all vitamins.                      Do not wear jewelry, make up, or nail polish            Do not wear lotions, powders, perfumes/colognes, or deodorant.            Do not shave 48 hours prior to surgery.  Men may shave face and neck.            Do not bring valuables to the hospital.            [facility] is not responsible for any belongings or valuables.  Do NOT Smoke (Tobacco/Vaping) 24 hours prior to your procedure If you use a CPAP at night, you may bring all equipment for your overnight stay.   Contacts, glasses, dentures or bridgework may not be worn into surgery.      For patients admitted to the hospital, discharge time will be determined by your treatment team.   Patients discharged the day of surgery will not be allowed to drive home, and someone needs to stay with them for 24 hours.    Special instructions:   [place]- Preparing For Surgery  Before surgery, you can play an important role. Because skin is not sterile, your skin needs to be as free of  germs as possible. You can reduce the number of germs on your skin by washing with CHG (chlorahexidine gluconate) Soap before surgery.  CHG is an antiseptic cleaner which kills germs and bonds with the skin to continue killing germs even after washing.    Oral Hygiene is also important to reduce your risk of infection.  Remember - BRUSH YOUR TEETH THE MORNING OF SURGERY WITH YOUR REGULAR TOOTHPASTE  Please do not use if you have an allergy to CHG or antibacterial soaps. If your skin becomes reddened/irritated stop using the CHG.  Do not shave (including legs and underarms) for at least 48 hours prior to first CHG shower. It is OK to shave your face.  Please follow these instructions carefully.   Shower the NIGHT BEFORE SURGERY and the MORNING OF SURGERY with DIAL Soap.   Pat yourself dry with a CLEAN TOWEL.  Wear CLEAN PAJAMAS to bed the night before surgery  Place CLEAN SHEETS on your bed the night of your first shower and DO NOT SLEEP WITH PETS.   Day of Surgery: Please shower morning of surgery  Wear Clean/Comfortable clothing the morning of surgery Do not apply any deodorants/lotions.  Remember to brush your teeth WITH YOUR REGULAR TOOTHPASTE.   Questions were answered. Patient verbalized understanding of instructions.

## 2022-06-11 ENCOUNTER — Ambulatory Visit
Admission: RE | Admit: 2022-06-11 | Discharge: 2022-06-11 | Disposition: A | Discharge status: Home / Self Care | Source: Ambulatory Visit | Attending: Student in an Organized Health Care Education/Training Program | Admitting: Student in an Organized Health Care Education/Training Program

## 2022-06-11 DIAGNOSIS — R911 Solitary pulmonary nodule: Secondary | ICD-10-CM | POA: Insufficient documentation

## 2022-06-11 NOTE — Anesthesia Preprocedure Evaluation (Signed)
Anesthesia Evaluation  Patient identified by MRN, date of birth, ID band Patient awake    Reviewed: Allergy & Precautions, NPO status , Patient's Chart, lab work & pertinent test results  History of Anesthesia Complications Negative for: history of anesthetic complications  Airway Mallampati: II  TM Distance: >3 FB Neck ROM: Full    Dental  (+) Dental Advisory Given, Missing   Pulmonary COPD, Current Smoker and Patient abstained from smoking. Lung nodule   breath sounds clear to auscultation       Cardiovascular hypertension, Pt. on medications (-) angina  Rhythm:Regular Rate:Normal     Neuro/Psych   Anxiety Depression       GI/Hepatic negative GI ROS, Neg liver ROS,,,  Endo/Other  negative endocrine ROS    Renal/GU negative Renal ROS     Musculoskeletal   Abdominal   Peds  Hematology negative hematology ROS (+)   Anesthesia Other Findings   Reproductive/Obstetrics                             Anesthesia Physical Anesthesia Plan  ASA: 3  Anesthesia Plan: General   Post-op Pain Management: Tylenol PO (pre-op)* and Minimal or no pain anticipated   Induction: Intravenous  PONV Risk Score and Plan: 2 and Ondansetron and Dexamethasone  Airway Management Planned: Oral ETT  Additional Equipment: None  Intra-op Plan:   Post-operative Plan: Extubation in OR  Informed Consent: I have reviewed the patients History and Physical, chart, labs and discussed the procedure including the risks, benefits and alternatives for the proposed anesthesia with the patient or authorized representative who has indicated his/her understanding and acceptance.     Dental advisory given  Plan Discussed with: CRNA and Surgeon  Anesthesia Plan Comments: (Plan routine monitors, GETA)        Anesthesia Quick Evaluation

## 2022-06-12 ENCOUNTER — Other Ambulatory Visit: Payer: Self-pay

## 2022-06-12 ENCOUNTER — Ambulatory Visit (HOSPITAL_COMMUNITY)
Admission: RE | Admit: 2022-06-12 | Discharge: 2022-06-12 | Disposition: A | Discharge status: Home / Self Care | Attending: Student in an Organized Health Care Education/Training Program | Admitting: Student in an Organized Health Care Education/Training Program

## 2022-06-12 ENCOUNTER — Encounter (HOSPITAL_COMMUNITY): Payer: Self-pay | Admitting: Student in an Organized Health Care Education/Training Program

## 2022-06-12 ENCOUNTER — Ambulatory Visit (HOSPITAL_COMMUNITY)

## 2022-06-12 ENCOUNTER — Encounter (HOSPITAL_COMMUNITY)
Admission: RE | Disposition: A | Discharge status: Home / Self Care | Payer: Self-pay | Source: Home / Self Care | Attending: Student in an Organized Health Care Education/Training Program

## 2022-06-12 ENCOUNTER — Ambulatory Visit (HOSPITAL_COMMUNITY): Admitting: Anesthesiology

## 2022-06-12 ENCOUNTER — Ambulatory Visit (HOSPITAL_BASED_OUTPATIENT_CLINIC_OR_DEPARTMENT_OTHER): Admitting: Anesthesiology

## 2022-06-12 DIAGNOSIS — M069 Rheumatoid arthritis, unspecified: Secondary | ICD-10-CM | POA: Insufficient documentation

## 2022-06-12 DIAGNOSIS — F32A Depression, unspecified: Secondary | ICD-10-CM | POA: Diagnosis not present

## 2022-06-12 DIAGNOSIS — R911 Solitary pulmonary nodule: Secondary | ICD-10-CM

## 2022-06-12 DIAGNOSIS — F17210 Nicotine dependence, cigarettes, uncomplicated: Secondary | ICD-10-CM | POA: Insufficient documentation

## 2022-06-12 DIAGNOSIS — I10 Essential (primary) hypertension: Secondary | ICD-10-CM | POA: Insufficient documentation

## 2022-06-12 DIAGNOSIS — J449 Chronic obstructive pulmonary disease, unspecified: Secondary | ICD-10-CM | POA: Insufficient documentation

## 2022-06-12 DIAGNOSIS — F419 Anxiety disorder, unspecified: Secondary | ICD-10-CM | POA: Insufficient documentation

## 2022-06-12 DIAGNOSIS — Z1152 Encounter for screening for COVID-19: Secondary | ICD-10-CM | POA: Diagnosis not present

## 2022-06-12 DIAGNOSIS — Z79899 Other long term (current) drug therapy: Secondary | ICD-10-CM | POA: Insufficient documentation

## 2022-06-12 DIAGNOSIS — F418 Other specified anxiety disorders: Secondary | ICD-10-CM

## 2022-06-12 DIAGNOSIS — F17200 Nicotine dependence, unspecified, uncomplicated: Secondary | ICD-10-CM | POA: Insufficient documentation

## 2022-06-12 HISTORY — PX: ENDOBRONCHIAL ULTRASOUND: SHX5096

## 2022-06-12 HISTORY — PX: BRONCHIAL BRUSHINGS: SHX5108

## 2022-06-12 HISTORY — PX: BRONCHIAL NEEDLE ASPIRATION BIOPSY: SHX5106

## 2022-06-12 LAB — SARS CORONAVIRUS 2 BY RT PCR: SARS Coronavirus 2 by RT PCR: NEGATIVE

## 2022-06-12 SURGERY — BRONCHOSCOPY, WITH BIOPSY USING ELECTROMAGNETIC NAVIGATION
Anesthesia: General

## 2022-06-12 MED ORDER — FENTANYL CITRATE (PF) 250 MCG/5ML IJ SOLN
INTRAMUSCULAR | Status: DC | PRN
  Administered 2022-06-12: 50 ug via INTRAVENOUS

## 2022-06-12 MED ORDER — MIDAZOLAM HCL 2 MG/2ML IJ SOLN
INTRAMUSCULAR | Status: DC | PRN
  Administered 2022-06-12 (×2): 1 mg via INTRAVENOUS

## 2022-06-12 MED ORDER — MIDAZOLAM HCL 2 MG/2ML IJ SOLN: 0.5000 mg | Freq: Once | INTRAMUSCULAR | Status: DC | PRN

## 2022-06-12 MED ORDER — PROMETHAZINE HCL 25 MG/ML IJ SOLN: 6.2500 mg | INTRAMUSCULAR | Status: DC | PRN

## 2022-06-12 MED ORDER — FENTANYL CITRATE (PF) 100 MCG/2ML IJ SOLN: 25.0000 ug | INTRAMUSCULAR | Status: DC | PRN

## 2022-06-12 MED ORDER — SUGAMMADEX SODIUM 200 MG/2ML IV SOLN
INTRAVENOUS | Status: DC | PRN
  Administered 2022-06-12: 200 mg via INTRAVENOUS

## 2022-06-12 MED ORDER — ACETAMINOPHEN 500 MG PO TABS
1000.0000 mg | ORAL_TABLET | Freq: Once | ORAL | Status: DC
  Filled 2022-06-12: qty 2

## 2022-06-12 MED ORDER — CHLORHEXIDINE GLUCONATE 0.12 % MT SOLN
15.0000 mL | Freq: Once | OROMUCOSAL | Status: AC
  Administered 2022-06-12: 15 mL via OROMUCOSAL
  Filled 2022-06-12: qty 15

## 2022-06-12 MED ORDER — PROPOFOL 10 MG/ML IV BOLUS
INTRAVENOUS | Status: DC | PRN
  Administered 2022-06-12: 100 mg via INTRAVENOUS

## 2022-06-12 MED ORDER — ONDANSETRON HCL 4 MG/2ML IJ SOLN
INTRAMUSCULAR | Status: DC | PRN
  Administered 2022-06-12: 4 mg via INTRAVENOUS

## 2022-06-12 MED ORDER — OXYCODONE HCL 5 MG PO TABS: 5.0000 mg | ORAL_TABLET | Freq: Once | ORAL | Status: DC | PRN

## 2022-06-12 MED ORDER — PHENYLEPHRINE 80 MCG/ML (10ML) SYRINGE FOR IV PUSH (FOR BLOOD PRESSURE SUPPORT)
PREFILLED_SYRINGE | INTRAVENOUS | Status: DC | PRN
  Administered 2022-06-12 (×8): 80 ug via INTRAVENOUS

## 2022-06-12 MED ORDER — OXYCODONE HCL 5 MG/5ML PO SOLN: 5.0000 mg | Freq: Once | ORAL | Status: DC | PRN

## 2022-06-12 MED ORDER — MEPERIDINE HCL 25 MG/ML IJ SOLN: 6.2500 mg | INTRAMUSCULAR | Status: DC | PRN

## 2022-06-12 MED ORDER — LIDOCAINE 2% (20 MG/ML) 5 ML SYRINGE
INTRAMUSCULAR | Status: DC | PRN
  Administered 2022-06-12: 40 mg via INTRAVENOUS
  Administered 2022-06-12: 20 mg via INTRAVENOUS

## 2022-06-12 MED ORDER — ROCURONIUM BROMIDE 10 MG/ML (PF) SYRINGE
PREFILLED_SYRINGE | INTRAVENOUS | Status: DC | PRN
  Administered 2022-06-12: 60 mg via INTRAVENOUS

## 2022-06-12 MED ORDER — LACTATED RINGERS IV SOLN: INTRAVENOUS | Status: DC

## 2022-06-12 MED ORDER — DEXAMETHASONE SODIUM PHOSPHATE 10 MG/ML IJ SOLN
INTRAMUSCULAR | Status: DC | PRN
  Administered 2022-06-12: 10 mg via INTRAVENOUS

## 2022-06-12 NOTE — Transfer of Care (Signed)
Immediate Anesthesia Transfer of Care Note  Patient: Jillian Ward  Procedure(s) Performed: ROBOTIC ASSISTED NAVIGATIONAL BRONCHOSCOPY ENDOBRONCHIAL ULTRASOUND VIDEO BRONCHOSCOPY WITH RADIAL ENDOBRONCHIAL ULTRASOUND BRONCHIAL NEEDLE ASPIRATION BIOPSIES BRONCHIAL BRUSHINGS  Patient Location: PACU  Anesthesia Type:General  Level of Consciousness: awake, alert , patient cooperative, and responds to stimulation  Airway & Oxygen Therapy: Patient Spontanous Breathing and Patient connected to nasal cannula oxygen  Post-op Assessment: Report given to RN, Post -op Vital signs reviewed and stable, and Patient moving all extremities X 4  Post vital signs: Reviewed and stable  Last Vitals:  Vitals Value Taken Time  BP 100/58 06/12/22 1007  Temp    Pulse 77 06/12/22 1010  Resp 18 06/12/22 1010  SpO2 96 % 06/12/22 1010  Vitals shown include unvalidated device data.  Last Pain:  Vitals:   06/12/22 0624  TempSrc:   PainSc: 0-No pain         Complications: No notable events documented.

## 2022-06-12 NOTE — H&P (Signed)
Assessment & Plan:   #LUL Pulmonary Nodule  Patient presenting for the evaluation of a LUL pulmonary nodule that was mildly PET avid on PET/CT. She has a history of cigarette smoking. The differential for the nodule includes malignancy, atypical infection, and inflammatory causes/benign etiology. She is presenting for a robotic assisted navigational bronchoscopy to sample the LUL nodule. Patient is appropriate for the procedure.  -proceed with robotic navigational bronchoscopy  Armando Reichert, MD [facility] 06/12/2022 8:38 AM    End of visit medications:  Meds ordered this encounter  Medications   acetaminophen (TYLENOL) tablet 1,000 mg   lactated ringers infusion   chlorhexidine (PERIDEX) 0.12 % solution 15 mL     Current Facility-Administered Medications:    acetaminophen (TYLENOL) tablet 1,000 mg, 1,000 mg, Oral, Once, Jillian Asa, MD   lactated ringers infusion, , Intravenous, Continuous, Jillian Nations, MD   Subjective:   PATIENT ID: Jillian Ward GENDER: female DOB: 11/05/1965, MRN: [id number]  No chief complaint on file.   HPI Patient is a pleasant 57 year old female presenting for the evaluation of a pulmonary nodule.   Patient was enrolled in the lung cancer screening program and underwent a low dose chest CT that showed a LUL peripheral pleural based nodule. She underwent a PET/CT that showed this to be mildly PET avid.   She has no symptoms, and denies any shortness of breath, chest tightness or wheezing. She has an occasional non-productive cough. No hemoptysis, no chills, no night sweats, and no unintentional weight loss. She has a history of rheumatoid arthritis for which she is on [place] and methotrexate. She does not have any rashes, worsening joint pain, GI or GU symptoms.  She has a history of smoking, having smoked a pack a day for 42 years. 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 She did live on [address] as her husband was in the [employer]. She spent three years living in [place] with him.  Ancillary information including prior medications, full medical/surgical/family/social histories, and PFTs (when available) are listed below and have been reviewed.   Review of Systems  Constitutional:  Negative for chills, fever, malaise/fatigue and weight loss.  Respiratory:  Negative for cough, hemoptysis, sputum production, shortness of breath and wheezing.   Cardiovascular:  Negative for chest pain.  Skin:  Negative for rash.     Objective:   Vitals:   06/10/22 1517 06/12/22 0617  BP:  119/82  Pulse:  80  Resp:  20  Temp:  98 F (36.7 C)  TempSrc:  Oral  SpO2:  99%  Weight: 53.1 kg 53.1 kg  Height: '5\' 1"'$  (1.549 m) '5\' 1"'$  (1.549 m)   99% on RA BMI Readings from Last 3 Encounters:  06/12/22 22.11 kg/m  05/15/22 22.22 kg/m  11/10/20 24.75 kg/m   Wt Readings from Last 3 Encounters:  06/12/22 53.1 kg  05/15/22 53.3 kg  11/10/20 59.4 kg    Physical Exam Constitutional:      Appearance: Normal appearance.  HENT:     Head: Normocephalic.  Cardiovascular:     Rate and Rhythm: Normal rate and regular rhythm.     Pulses: Normal pulses.     Heart sounds: Normal heart sounds.  Pulmonary:     Effort: Pulmonary effort is normal.     Breath sounds: Normal breath sounds.  Neurological:     General: No focal deficit present.     Mental Status: She is alert and  oriented to person, place, and time. Mental status is at baseline.       Ancillary Information    Past Medical History:  Diagnosis Date   Anxiety    Arthritis    rheumatoid   Depression    Dyspnea    Hypertension    Nodule of right lung    Rib fracture      History reviewed. No pertinent family history.   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.;  Surgeon: Corky Mull, MD;  Location: [facility] ORS;  Service: Orthopedics;  Laterality: Left;   HERNIA REPAIR  2005   ABDOMINAL   HERNIA REPAIR     UMBILICAL    Social History   Socioeconomic History   Marital status: Married    Spouse name: Not on file   Number of children: Not on file   Years of education: Not on file   Highest education level: Not on file  Occupational History   Not on file  Tobacco Use   Smoking status: Heavy Smoker    Packs/day: 1.00    Years: 42.00    Total pack years: 42.00    Types: Cigarettes   Smokeless tobacco: Never   Tobacco comments:    15 cigarettes daily-05/15/2022  Substance and Sexual Activity   Alcohol use: No    Comment: socially    Drug use: No   Sexual activity: Not on file  Other Topics Concern   Not on file  Social History Narrative   Not on file   Social Determinants of Health   Financial Resource Strain: Not on file  Food Insecurity: Not on file  Transportation Needs: Not on file  Physical Activity: Not on file  Stress: Not on file  Social Connections: Not on file  Intimate Partner Violence: Not on file     Allergies  Allergen Reactions   Penicillins Hives   Iodinated Contrast Media Hives and Rash     CBC    Component Value Date/Time   WBC 15.4 (H) 11/10/2020 0833   RBC 4.93 11/10/2020 0833   HGB 15.1 (H) 11/10/2020 0833   HCT 42.7 11/10/2020 0833   PLT 350 11/10/2020 0833   MCV 86.6 11/10/2020 0833   MCH 30.6 11/10/2020 0833   MCHC 35.4 11/10/2020 0833   RDW 12.0  11/10/2020 0833    Pulmonary Functions Testing Results:     No data to display

## 2022-06-12 NOTE — Anesthesia Postprocedure Evaluation (Signed)
Anesthesia Post Note  Patient: Jillian Ward  Procedure(s) Performed: ROBOTIC ASSISTED NAVIGATIONAL BRONCHOSCOPY ENDOBRONCHIAL ULTRASOUND VIDEO BRONCHOSCOPY WITH RADIAL ENDOBRONCHIAL ULTRASOUND BRONCHIAL NEEDLE ASPIRATION BIOPSIES BRONCHIAL BRUSHINGS     Patient location during evaluation: PACU Anesthesia Type: General Level of consciousness: awake and alert, patient cooperative and oriented Pain management: pain level controlled Vital Signs Assessment: post-procedure vital signs reviewed and stable Respiratory status: spontaneous breathing, nonlabored ventilation and respiratory function stable Cardiovascular status: blood pressure returned to baseline and stable Postop Assessment: no apparent nausea or vomiting, adequate PO intake and able to ambulate Anesthetic complications: no   No notable events documented.  Last Vitals:  Vitals:   06/12/22 1045 06/12/22 1051  BP: 98/78 102/77  Pulse: 77 77  Resp: 14 16  Temp:    SpO2: 93% 95%    Last Pain:  Vitals:   06/12/22 1051  TempSrc:   PainSc: 0-No pain                 Agustus Mane,E. Zofia Peckinpaugh

## 2022-06-12 NOTE — Op Note (Signed)
Video Bronchoscopy with Robotic Assisted Bronchoscopic Navigation   Date of Operation: 06/12/2022   Pre-op Diagnosis: Pulmonary nodule  Post-op Diagnosis: Same  Surgeon: Armando Reichert, MD  Anesthesia: General endotracheal anesthesia  Operation: Flexible video fiberoptic bronchoscopy with robotic assistance and biopsies.  Estimated Blood Loss: Minimal  Complications: None  Indications and History: Jillian Ward is a 57 y.o. female with history of rheumatoid arthritis found to have a LUL nodule on lung cancer screening. The risks, benefits, complications, treatment options and expected outcomes were discussed with the patient.  The possibilities of pneumothorax, pneumonia, reaction to medication, pulmonary aspiration, perforation of a viscus, bleeding, failure to diagnose a condition and creating a complication requiring transfusion or operation were discussed with the patient who freely signed the consent.    Description of Procedure: The patient was seen in the Preoperative Area, was examined and was deemed appropriate to proceed.  The patient was taken to [facility] endoscopy room 3, identified as Jillian Ward and the procedure verified as Flexible Video Fiberoptic Bronchoscopy.  A Time Out was held and the above information confirmed.   Prior to the date of the procedure a high-resolution CT scan of the chest was performed. Utilizing ION software program a virtual tracheobronchial tree was generated to allow the creation of distinct navigation pathways to the patient's parenchymal abnormalities. After being taken to the operating room general anesthesia was initiated and the patient  was orally intubated. The video fiberoptic bronchoscope was introduced via the endotracheal tube and a general inspection was performed which showed normal right and left lung anatomy, aspiration of the bilateral mainstems was completed to remove any remaining secretions. Robotic catheter inserted into  patient's endotracheal tube.   Target #1 LUL: The distinct navigation pathways prepared prior to this procedure were then utilized to navigate to patient's lesion identified on CT scan. This was confirmed and adjusted with cone beam CT guidance (CiOS spin). The robotic catheter was secured into place and the vision probe was withdrawn.  Lesion location was approximated using fluoroscopy and radial endobronchial ultrasound for peripheral targeting. Under fluoroscopic guidance transbronchial brushings and transbronchial needle biopsies were performed to be sent for cytology and culture (aerobic, anaerobic, fungal, and AFB).  At the end of the procedure a general airway inspection was performed and there was no evidence of active bleeding. The bronchoscope was removed. The EBUS scope was introduced and the hilar and mediastinal lymph nodes were examined. No enlarged lymphadenopathy was encountered The patient tolerated the procedure well. There was no significant blood loss and there were no obvious complications. A post-procedural chest x-ray is pending.  Samples Target #1: 1. Transbronchial brushings from LUL 2. Transbronchial fine needle biopsies from LUL\   Plans:  The patient will be discharged from the PACU to home when recovered from anesthesia and after chest x-ray is reviewed. We will review the cytology, pathology and microbiology results with the patient when they become available. Outpatient followup will be with me.  Armando Reichert, MD [facility] 06/12/2022 10:03 AM

## 2022-06-12 NOTE — Anesthesia Procedure Notes (Signed)
Procedure Name: Intubation Date/Time: 06/12/2022 9:01 AM  Performed by: Betha Loa, CRNAPre-anesthesia Checklist: Patient identified, Emergency Drugs available, Suction available and Patient being monitored Patient Re-evaluated:Patient Re-evaluated prior to induction Oxygen Delivery Method: Circle System Utilized Preoxygenation: Pre-oxygenation with 100% oxygen Induction Type: IV induction Ventilation: Mask ventilation without difficulty Laryngoscope Size: Mac and 3 Grade View: Grade I Tube type: Oral Tube size: 8.5 mm Number of attempts: 1 Airway Equipment and Method: Stylet Placement Confirmation: ETT inserted through vocal cords under direct vision, positive ETCO2 and breath sounds checked- equal and bilateral Secured at: 21 cm Tube secured with: Tape Dental Injury: Teeth and Oropharynx as per pre-operative assessment

## 2022-06-13 ENCOUNTER — Telehealth: Payer: Self-pay | Admitting: Student in an Organized Health Care Education/Training Program

## 2022-06-13 LAB — CYTOLOGY - NON PAP

## 2022-06-13 LAB — ACID FAST SMEAR (AFB, MYCOBACTERIA): Acid Fast Smear: NEGATIVE

## 2022-06-13 NOTE — Telephone Encounter (Signed)
Dr. Genia Harold, please advise if 3/11 appt should be rescheduled? Bronch 3/6.

## 2022-06-13 NOTE — Telephone Encounter (Signed)
Can do 11:30 am on the 13th. My morning bronch shouldn't take too much time. Thank you

## 2022-06-13 NOTE — Telephone Encounter (Signed)
Pt is asking if Mondays appointment needs to be rescheduled since she just had surgery yesterday

## 2022-06-13 NOTE — Telephone Encounter (Signed)
Appt scheduled 06/19/2022 at 11:30. Patient is aware and voiced her understanding.  Nothing further needed.

## 2022-06-13 NOTE — Telephone Encounter (Signed)
No availability the following week. Next available is 4/8.  Is this too far out?

## 2022-06-16 ENCOUNTER — Encounter (HOSPITAL_COMMUNITY): Payer: Self-pay | Admitting: Student in an Organized Health Care Education/Training Program

## 2022-06-17 ENCOUNTER — Ambulatory Visit: Admitting: Student in an Organized Health Care Education/Training Program

## 2022-06-17 LAB — AEROBIC/ANAEROBIC CULTURE W GRAM STAIN (SURGICAL/DEEP WOUND)
Culture: NO GROWTH
Gram Stain: NONE SEEN

## 2022-06-18 ENCOUNTER — Ambulatory Visit: Admitting: Student in an Organized Health Care Education/Training Program

## 2022-06-19 ENCOUNTER — Encounter: Payer: Self-pay | Admitting: Student in an Organized Health Care Education/Training Program

## 2022-06-19 ENCOUNTER — Ambulatory Visit: Admitting: Student in an Organized Health Care Education/Training Program

## 2022-06-19 VITALS — BP 120/70 | HR 100 | Temp 97.6°F | Ht 61.0 in | Wt 117.6 lb

## 2022-06-19 DIAGNOSIS — Z87891 Personal history of nicotine dependence: Secondary | ICD-10-CM | POA: Diagnosis not present

## 2022-06-19 DIAGNOSIS — R911 Solitary pulmonary nodule: Secondary | ICD-10-CM

## 2022-06-19 NOTE — Progress Notes (Signed)
Assessment & Plan:   1. Lung nodule 2. Personal history of tobacco use, presenting hazards to health   Nodule Location: LUL Nodule Size: 1.5 cm x 1 cm Nodule Spiculation: No Associated Lymphadenopathy: No Smoking Status (current) and pack years (42) Extrathoracic cancer > 5 years prior (no) ECOG: 0  The patient is here to discuss their imaging abnormalities and findings from her recent biopsy. Cytology (FNA and brushings) is non-diagnostic with no findings of malignancy. There was mucinous material in said LUL nodule with some inflammatory cells (histiocytes) on the thin prep. This is leading me to consider a benign etiology behind her nodule (including the possibility of a rheumatoid nodule). While low on the differential, a low grade mucinous adenocarcinoma remains possible.  I have discussed all these results with the patient and explained to her that the most likely etiology is benign, but that we would proceed with radiographic surveillance of the nodule to ensure stability. Should it enlarge, we would consider wedge resection. Patient will have a repeat CT in 6 months and then see me for follow up afterwards.   Return in about 6 months (around 12/27/2022).  I spent 25 minutes caring for this patient today, including preparing to see the patient, obtaining a medical history , reviewing a separately obtained history, performing a medically appropriate examination and/or evaluation, counseling and educating the patient/family/caregiver, ordering medications, tests, or procedures, and documenting clinical information in the electronic health record  Armando Reichert, MD [facility] 06/19/2022 11:41 AM    End of visit medications:  No orders of the defined types were placed in this encounter.    Current Outpatient Medications:    acetaminophen (TYLENOL) 325 MG tablet, Take 2 tablets (650 mg total) by mouth every 4 (four) hours as needed for mild pain ((score 1  to 3) or temp > 100.5)., Disp: , Rfl:    Adalimumab 40 MG/0.8ML PSKT, Inject 40 mg into the skin See admin instructions. Every 7 to 10 days, Disp: , Rfl:    amLODipine (NORVASC) 5 MG tablet, Take 5 mg by mouth daily. , Disp: , Rfl:    buPROPion (WELLBUTRIN XL) 300 MG 24 hr tablet, Take 300 mg by mouth daily., Disp: , Rfl:    celecoxib (CELEBREX) 100 MG capsule, Take 100 mg by mouth 2 (two) times daily., Disp: , Rfl:    cholecalciferol (VITAMIN D3) 25 MCG (1000 UNIT) tablet, Take 1,000 Units by mouth daily., Disp: , Rfl:    gabapentin (NEURONTIN) 100 MG capsule, Take 100 mg by mouth at bedtime., Disp: , Rfl:    Methotrexate, PF, 10 MG/0.2ML SOAJ, Inject 10 mg into the skin every Monday., Disp: , Rfl:    pregabalin (LYRICA) 150 MG capsule, Take 150 mg by mouth 2 (two) times daily., Disp: , Rfl:    venlafaxine XR (EFFEXOR-XR) 150 MG 24 hr capsule, Take 150 mg by mouth daily with breakfast., Disp: , Rfl:    venlafaxine XR (EFFEXOR-XR) 75 MG 24 hr capsule, Take 75 mg by mouth daily with breakfast., Disp: , Rfl:    Subjective:   PATIENT ID: Jillian Ward GENDER: female DOB: [birth year], MRN: [id number]  Chief Complaint  Patient presents with   Follow-up    SOB with exertion and prod cough with white sputum.     HPI  Patient is a pleasant 57 year old female presenting to clinic for follow up on her pulmonary nodule. Patient was first seen by me on 05/15/2022 and underwent robotic  assisted navigational bronchoscopy on 06/12/2022.   Patient was recently enrolled in the lung cancer screening program and underwent a low dose chest CT on 04/25/2022 that showed a LUL peripheral pleural based nodule. She underwent a PET/CT on 05/13/2022 that showed this to be mildly PET avid. She had her planning CT  on 06/11/2022 and subsequently underwent robotic assisted navigational bronchoscopy to the LUL nodule with cone beam CT guidance on 06/12/2022.  The pathology report from the biopsy (multiple FNA's and brushings)  was non-diagnostic. I spoke to the pathologist who finalized the read and on revision of the slides, they mostly consist of mucinous material. The thin prep performed during the procedure is notable for histiocytes and inflammatory cells on review by the pathologist. No malignant cells were noted in all the samples.   She has no symptoms, and denies any shortness of breath, chest tightness or wheezing. She has an occasional non-productive cough. No hemoptysis, no chills, no night sweats, and no unintentional weight loss. She has a history of rheumatoid arthritis for which she is on [place] and methotrexate. She does not have any rashes, worsening joint pain, GI or GU symptoms.   She has a history of smoking, having smoked a pack a day for 42 years. 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 She did live on [address] as her husband was in the [employer]. She spent three years living in [place] with him.  Ancillary information including prior medications, full medical/surgical/family/social histories, and PFTs (when available) are listed below and have been reviewed.   Review of Systems  Constitutional:  Negative for chills, diaphoresis, fever, malaise/fatigue and weight loss.  Respiratory:  Positive for cough. Negative for hemoptysis, sputum production, shortness of breath and wheezing.   Cardiovascular:  Negative for chest pain and PND.  Skin:  Negative for rash.     Objective:   Vitals:   06/19/22 1127  BP: 120/70  Pulse: 100  Temp: 97.6 F (36.4 C)  TempSrc: Temporal  SpO2: 98%  Weight: 117 lb 9.6 oz (53.3 kg)  Height: '5\' 1"'$  (1.549 m)   98% on RA BMI Readings from Last 3 Encounters:  06/19/22 22.22 kg/m  06/12/22 22.11 kg/m  05/15/22 22.22 kg/m   Wt Readings from Last 3 Encounters:  06/19/22 117 lb 9.6 oz (53.3 kg)  06/12/22 117 lb (53.1 kg)  05/15/22 117 lb 9.6 oz (53.3 kg)    Physical Exam Constitutional:      General: She is not in acute distress.     Appearance: Normal appearance. She is not ill-appearing.  HENT:     Head: Normocephalic.     Mouth/Throat:     Mouth: Mucous membranes are moist.  Cardiovascular:     Rate and Rhythm: Normal rate and regular rhythm.     Pulses: Normal pulses.     Heart sounds: Normal heart sounds.  Pulmonary:     Effort: Pulmonary effort is normal.     Breath sounds: Normal breath sounds.  Abdominal:     Palpations: Abdomen is soft.  Neurological:     General: No focal deficit present.     Mental Status: She is alert and oriented to person, place, and time. Mental status is at baseline.       Ancillary Information    Past Medical History:  Diagnosis Date   Anxiety    Arthritis    rheumatoid   Depression    Dyspnea    Hypertension    Nodule  of right lung    Rib fracture      No family history on file.   Past Surgical History:  Procedure Laterality Date   ANTERIOR CERVICAL DECOMP/DISCECTOMY FUSION N/A 10/18/2020   Procedure: C4-6 ANTERIOR CERVICAL DECOMPRESSION/DISCECTOMY FUSION 2 LEVELS;  Surgeon: Meade Maw, MD;  Location: [facility];  Service: Neurosurgery;  Laterality: N/A;  schedule as 1st case   APPENDECTOMY     BRONCHIAL BRUSHINGS  06/12/2022   Procedure: BRONCHIAL BRUSHINGS;  Surgeon: Armando Reichert, MD;  Location: [facility];  Service: Pulmonary;;   BRONCHIAL NEEDLE ASPIRATION BIOPSY  06/12/2022   Procedure: BRONCHIAL NEEDLE ASPIRATION BIOPSIES;  Surgeon: Armando Reichert, MD;  Location: [facility];  Service: Pulmonary;;   CESAREAN SECTION     COLONOSCOPY WITH PROPOFOL N/A 01/15/2016   Procedure: COLONOSCOPY WITH PROPOFOL;  Surgeon: Manya Silvas, MD;  Location: [facility];  Service: Endoscopy;  Laterality: N/A;   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   Procedure: 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.;  Surgeon: Corky Mull, MD;  Location: [facility];  Service: Orthopedics;  Laterality: Left;   ENDOBRONCHIAL ULTRASOUND   06/12/2022   Procedure: ENDOBRONCHIAL ULTRASOUND;  Surgeon: Armando Reichert, MD;  Location: [facility];  Service: Pulmonary;;   HERNIA REPAIR  2005   ABDOMINAL   HERNIA REPAIR     UMBILICAL    Social History   Socioeconomic History   Marital status: Married    Spouse name: Not on file   Number of children: Not on file   Years of education: Not on file   Highest education level: Not on file  Occupational History   Not on file  Tobacco Use   Smoking status: Heavy Smoker    Packs/day: 1.00    Years: 42.00    Total pack years: 42.00    Types: Cigarettes   Smokeless tobacco: Never   Tobacco comments:    15 cigarettes daily-06/19/2022  Substance and Sexual Activity   Alcohol use: No    Comment: socially    Drug use: No   Sexual activity: Not on file  Other Topics Concern   Not on file  Social History Narrative   Not on file   Social Determinants of Health   Financial Resource Strain: Not on file  Food Insecurity: Not on file  Transportation Needs: Not on file  Physical Activity: Not on file  Stress: Not on file  Social Connections: Not on file  Intimate Partner Violence: Not on file     Allergies  Allergen Reactions   Penicillins Hives   Iodinated Contrast Media Hives and Rash     CBC    Component Value Date/Time   WBC 15.4 (H) 11/10/2020 0833   RBC 4.93 11/10/2020 0833   HGB 15.1 (H) 11/10/2020 0833   HCT 42.7 11/10/2020 0833   PLT 350 11/10/2020 0833   MCV 86.6 11/10/2020 0833   MCH 30.6 11/10/2020 0833   MCHC 35.4 11/10/2020 0833   RDW 12.0 11/10/2020 0833    Pulmonary Functions Testing Results:     No data to display          Outpatient Medications Prior to Visit  Medication Sig Dispense Refill   acetaminophen (TYLENOL) 325 MG tablet Take 2 tablets (650 mg total) by mouth every 4 (four) hours as needed for mild pain ((score 1 to 3) or temp > 100.5).     Adalimumab 40 MG/0.8ML PSKT Inject 40 mg into the skin See admin instructions. Every  7 to  10 days     amLODipine (NORVASC) 5 MG tablet Take 5 mg by mouth daily.      buPROPion (WELLBUTRIN XL) 300 MG 24 hr tablet Take 300 mg by mouth daily.     celecoxib (CELEBREX) 100 MG capsule Take 100 mg by mouth 2 (two) times daily.     cholecalciferol (VITAMIN D3) 25 MCG (1000 UNIT) tablet Take 1,000 Units by mouth daily.     gabapentin (NEURONTIN) 100 MG capsule Take 100 mg by mouth at bedtime.     Methotrexate, PF, 10 MG/0.2ML SOAJ Inject 10 mg into the skin every Monday.     pregabalin (LYRICA) 150 MG capsule Take 150 mg by mouth 2 (two) times daily.     venlafaxine XR (EFFEXOR-XR) 150 MG 24 hr capsule Take 150 mg by mouth daily with breakfast.     venlafaxine XR (EFFEXOR-XR) 75 MG 24 hr capsule Take 75 mg by mouth daily with breakfast.     No facility-administered medications prior to visit.

## 2022-07-03 LAB — CULTURE, FUNGUS WITHOUT SMEAR

## 2022-07-26 LAB — ACID FAST CULTURE WITH REFLEXED SENSITIVITIES (MYCOBACTERIA): Acid Fast Culture: NEGATIVE

## 2022-12-24 ENCOUNTER — Ambulatory Visit
Admission: RE | Admit: 2022-12-24 | Discharge: 2022-12-24 | Disposition: A | Discharge status: Home / Self Care | Source: Ambulatory Visit | Attending: Student in an Organized Health Care Education/Training Program | Admitting: Student in an Organized Health Care Education/Training Program

## 2022-12-24 DIAGNOSIS — R911 Solitary pulmonary nodule: Secondary | ICD-10-CM | POA: Diagnosis present

## 2022-12-24 DIAGNOSIS — Z87891 Personal history of nicotine dependence: Secondary | ICD-10-CM | POA: Diagnosis present

## 2023-01-06 ENCOUNTER — Ambulatory Visit: Admitting: Student in an Organized Health Care Education/Training Program

## 2023-01-06 ENCOUNTER — Encounter: Payer: Self-pay | Admitting: Student in an Organized Health Care Education/Training Program

## 2023-01-06 VITALS — BP 108/76 | HR 75 | Temp 97.6°F | Ht 61.0 in | Wt 122.6 lb

## 2023-01-06 DIAGNOSIS — R911 Solitary pulmonary nodule: Secondary | ICD-10-CM

## 2023-01-06 DIAGNOSIS — Z87891 Personal history of nicotine dependence: Secondary | ICD-10-CM

## 2023-01-06 NOTE — Progress Notes (Signed)
Assessment & Plan:   1. Lung nodule 2. Personal history of tobacco use   Patient was found to have a LUL pulmonary nodule on LDCT for lung cancer screening, which was followed with a PET/CT and robotic assisted navigational bronchoscopy with biopsy of said nodule.  Cytology (FNA and brushings) was non-diagnostic with no findings of malignancy. There was mucinous material in said LUL nodule with some inflammatory cells (histiocytes) on the thin prep. This was consistent with a benign etiology (infectious vs rheumatoid nodule).   Today, the patient is presenting for follow up after an interim repeat Chest CT. While there isn't an official radiology read, the nodule in question appears decreased in size and less prominent on my review. This is again consistent with a benign etiology. Would recommend continued LDCT for lung cancer screening with attention to the LUL nodule on followu p.   -Continue LDCT for lung cancer screening, next in 12/2023  Return in about 1 year (around 01/06/2024).  I spent 30 minutes caring for this patient today, including preparing to see the patient, obtaining a medical history , reviewing a separately obtained history, performing a medically appropriate examination and/or evaluation, counseling and educating the patient/family/caregiver, ordering medications, tests, or procedures, documenting clinical information in the electronic health record, and independently interpreting results (not separately reported/billed) and communicating results to the patient/family/caregiver  Raechel Chute, MD [facility] 01/06/2023 3:11 PM    End of visit medications:  No orders of the defined types were placed in this encounter.    Current Outpatient Medications:    acetaminophen (TYLENOL) 325 MG tablet, Take 2 tablets (650 mg total) by mouth every 4 (four) hours as needed for mild pain ((score 1 to 3) or temp > 100.5)., Disp: , Rfl:    Adalimumab 40  MG/0.8ML PSKT, Inject 40 mg into the skin See admin instructions. Every 7 to 10 days, Disp: , Rfl:    buPROPion (WELLBUTRIN XL) 300 MG 24 hr tablet, Take 300 mg by mouth daily., Disp: , Rfl:    cholecalciferol (VITAMIN D3) 25 MCG (1000 UNIT) tablet, Take 1,000 Units by mouth daily., Disp: , Rfl:    gabapentin (NEURONTIN) 100 MG capsule, Take 100 mg by mouth at bedtime., Disp: , Rfl:    Methotrexate, PF, 10 MG/0.2ML SOAJ, Inject 10 mg into the skin every Monday., Disp: , Rfl:    pregabalin (LYRICA) 150 MG capsule, Take 150 mg by mouth 2 (two) times daily., Disp: , Rfl:    venlafaxine XR (EFFEXOR-XR) 150 MG 24 hr capsule, Take 150 mg by mouth daily with breakfast., Disp: , Rfl:    venlafaxine XR (EFFEXOR-XR) 75 MG 24 hr capsule, Take 75 mg by mouth daily with breakfast., Disp: , Rfl:    Subjective:   PATIENT ID: Jillian Ward GENDER: female DOB: 1965/09/19, MRN: [id number]  Chief Complaint  Patient presents with   Follow-up    HPI  Patient is a pleasant 57 year old female presenting to clinic for follow up on her pulmonary nodule. Patient was first seen by me on 05/15/2022 and underwent robotic assisted navigational bronchoscopy on 06/12/2022.  Interim history is unremarkable. She's not had any new symptoms, with no change in her breathing and no new cough or shortness of breath.   Patient was enrolled in the lung cancer screening program and underwent a low dose chest CT on 04/25/2022 that showed a LUL peripheral pleural based nodule. She underwent a PET/CT on 05/13/2022 that showed this to  be mildly PET avid. She had her planning CT  on 06/11/2022 and subsequently underwent robotic assisted navigational bronchoscopy to the LUL nodule with cone beam CT guidance on 06/12/2022.   The pathology report from the biopsy (multiple FNA's and brushings) was non-diagnostic. I spoke to the pathologist who finalized the read and on revision of the slides, they mostly consist of mucinous material. The thin  prep performed during the procedure is notable for histiocytes and inflammatory cells on review by the pathologist. No malignant cells were noted in all the samples.  She has a history of rheumatoid arthritis for which she is on [place] and methotrexate.  She has a history of smoking, having smoked a pack a day for 42 years. 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 She did live on [address] as her husband was in the [employer]. She spent three years living in [place] with him.    Ancillary information including prior medications, full medical/surgical/family/social histories, and PFTs (when available) are listed below and have been reviewed.   Review of Systems  Constitutional:  Negative for chills, diaphoresis, fever, malaise/fatigue and weight loss.  Respiratory:  Negative for cough, hemoptysis, sputum production, shortness of breath and wheezing.   Cardiovascular:  Negative for chest pain and PND.  Skin:  Negative for rash.     Objective:   Vitals:   01/06/23 1459  BP: 108/76  Pulse: 75  Temp: 97.6 F (36.4 C)  TempSrc: Temporal  SpO2: 99%  Weight: 122 lb 9.6 oz (55.6 kg)  Height: 5\' 1"  (1.549 m)   99% on RA BMI Readings from Last 3 Encounters:  01/06/23 23.17 kg/m  06/19/22 22.22 kg/m  06/12/22 22.11 kg/m   Wt Readings from Last 3 Encounters:  01/06/23 122 lb 9.6 oz (55.6 kg)  06/19/22 117 lb 9.6 oz (53.3 kg)  06/12/22 117 lb (53.1 kg)    Physical Exam Constitutional:      General: She is not in acute distress.    Appearance: Normal appearance. She is not ill-appearing.  HENT:     Head: Normocephalic.     Mouth/Throat:     Mouth: Mucous membranes are moist.  Cardiovascular:     Rate and Rhythm: Normal rate and regular rhythm.     Pulses: Normal pulses.     Heart sounds: Normal heart sounds.  Pulmonary:     Effort: Pulmonary effort is normal.     Breath sounds: Normal breath sounds.  Abdominal:     Palpations: Abdomen is soft.  Neurological:      General: No focal deficit present.     Mental Status: She is alert and oriented to person, place, and time. Mental status is at baseline.       Ancillary Information    Past Medical History:  Diagnosis Date   Anxiety    Arthritis    rheumatoid   Depression    Dyspnea    Hypertension    Nodule of right lung    Rib fracture      No family history on file.   Past Surgical History:  Procedure Laterality Date   ANTERIOR CERVICAL DECOMP/DISCECTOMY FUSION N/A 10/18/2020   Procedure: C4-6 ANTERIOR CERVICAL DECOMPRESSION/DISCECTOMY FUSION 2 LEVELS;  Surgeon: Venetia Night, MD;  Location: [facility];  Service: Neurosurgery;  Laterality: N/A;  schedule as 1st case   APPENDECTOMY     BRONCHIAL BRUSHINGS  06/12/2022   Procedure: BRONCHIAL BRUSHINGS;  Surgeon: Raechel Chute, MD;  Location: [facility];  Service: Pulmonary;;   BRONCHIAL NEEDLE ASPIRATION BIOPSY  06/12/2022   Procedure: BRONCHIAL NEEDLE ASPIRATION BIOPSIES;  Surgeon: Raechel Chute, MD;  Location: [facility];  Service: Pulmonary;;   CESAREAN SECTION     COLONOSCOPY WITH PROPOFOL N/A 01/15/2016   Procedure: COLONOSCOPY WITH PROPOFOL;  Surgeon: Scot Jun, MD;  Location: [facility] ENDOSCOPY;  Service: Endoscopy;  Laterality: N/A;   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   Procedure: 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.;  Surgeon: Christena Flake, MD;  Location: [facility];  Service: Orthopedics;  Laterality: Left;   ENDOBRONCHIAL ULTRASOUND  06/12/2022   Procedure: ENDOBRONCHIAL ULTRASOUND;  Surgeon: Raechel Chute, MD;  Location: [facility];  Service: Pulmonary;;   HERNIA REPAIR  2005   ABDOMINAL   HERNIA REPAIR     UMBILICAL    Social History   Socioeconomic History   Marital status: Married    Spouse name: Not on file   Number of children: Not on file   Years of education: Not on file   Highest education level: Not on file  Occupational History   Not on file   Tobacco Use   Smoking status: Heavy Smoker    Current packs/day: 1.00    Average packs/day: 1 pack/day for 42.0 years (42.0 ttl pk-yrs)    Types: Cigarettes   Smokeless tobacco: Never   Tobacco comments:    15 cigarettes daily-06/19/2022  Substance and Sexual Activity   Alcohol use: No    Comment: socially    Drug use: No   Sexual activity: Not on file  Other Topics Concern   Not on file  Social History Narrative   Not on file   Social Determinants of Health   Financial Resource Strain: Not on file  Food Insecurity: No Food Insecurity (06/20/2021)   Received from [facility], [facility]   Hunger Vital Sign    Worried About Running Out of Food in the Last Year: Never true    Ran Out of Food in the Last Year: Never true  Transportation Needs: No Transportation Needs (06/20/2021)   [facility] from [facility], [facility]   [facility] - Transportation    In the past 12 months, has lack of transportation kept you from medical appointments or from getting medications?: No    Lack of Transportation (Non-Medical): No  Physical Activity: Not on file  Stress: Not on file  Social Connections: Not on file  Intimate Partner Violence: Not on file     Allergies  Allergen Reactions   Metoclopramide Hives   Penicillins Hives   Iodinated Contrast Media Hives and Rash     CBC    Component Value Date/Time   WBC 15.4 (H) 11/10/2020 0833   RBC 4.93 11/10/2020 0833   HGB 15.1 (H) 11/10/2020 0833   HCT 42.7 11/10/2020 0833   PLT 350 11/10/2020 0833   MCV 86.6 11/10/2020 0833   MCH 30.6 11/10/2020 0833   MCHC 35.4 11/10/2020 0833   RDW 12.0 11/10/2020 0833    Pulmonary Functions Testing Results:     No data to display          Outpatient Medications Prior to Visit  Medication Sig Dispense Refill   acetaminophen (TYLENOL) 325 MG tablet Take 2 tablets (650 mg total) by mouth every 4 (four) hours as needed for  mild pain ((score 1 to 3) or temp > 100.5).     Adalimumab 40 MG/0.8ML PSKT  Inject 40 mg into the skin See admin instructions. Every 7 to 10 days     buPROPion (WELLBUTRIN XL) 300 MG 24 hr tablet Take 300 mg by mouth daily.     cholecalciferol (VITAMIN D3) 25 MCG (1000 UNIT) tablet Take 1,000 Units by mouth daily.     gabapentin (NEURONTIN) 100 MG capsule Take 100 mg by mouth at bedtime.     Methotrexate, PF, 10 MG/0.2ML SOAJ Inject 10 mg into the skin every Monday.     pregabalin (LYRICA) 150 MG capsule Take 150 mg by mouth 2 (two) times daily.     venlafaxine XR (EFFEXOR-XR) 150 MG 24 hr capsule Take 150 mg by mouth daily with breakfast.     venlafaxine XR (EFFEXOR-XR) 75 MG 24 hr capsule Take 75 mg by mouth daily with breakfast.     amLODipine (NORVASC) 5 MG tablet Take 5 mg by mouth daily.  (Patient not taking: Reported on 01/06/2023)     celecoxib (CELEBREX) 100 MG capsule Take 100 mg by mouth 2 (two) times daily.     No facility-administered medications prior to visit.

## 2023-01-07 ENCOUNTER — Other Ambulatory Visit: Payer: Self-pay | Admitting: Student in an Organized Health Care Education/Training Program

## 2023-01-07 DIAGNOSIS — R911 Solitary pulmonary nodule: Secondary | ICD-10-CM

## 2023-01-07 DIAGNOSIS — F17210 Nicotine dependence, cigarettes, uncomplicated: Secondary | ICD-10-CM

## 2023-01-27 ENCOUNTER — Other Ambulatory Visit: Payer: Self-pay | Admitting: Nurse Practitioner

## 2023-01-27 DIAGNOSIS — Z1231 Encounter for screening mammogram for malignant neoplasm of breast: Secondary | ICD-10-CM

## 2023-02-24 ENCOUNTER — Inpatient Hospital Stay: Admission: RE | Admit: 2023-02-24 | Source: Ambulatory Visit

## 2023-02-25 ENCOUNTER — Ambulatory Visit
Admission: RE | Admit: 2023-02-25 | Discharge: 2023-02-25 | Disposition: A | Discharge status: Home / Self Care | Source: Ambulatory Visit | Attending: Nurse Practitioner | Admitting: Nurse Practitioner

## 2023-02-25 DIAGNOSIS — Z1231 Encounter for screening mammogram for malignant neoplasm of breast: Secondary | ICD-10-CM | POA: Diagnosis present

## 2023-07-08 ENCOUNTER — Ambulatory Visit
Admission: RE | Admit: 2023-07-08 | Discharge: 2023-07-08 | Disposition: A | Discharge status: Home / Self Care | Source: Ambulatory Visit | Attending: Student in an Organized Health Care Education/Training Program | Admitting: Student in an Organized Health Care Education/Training Program

## 2023-07-08 DIAGNOSIS — R911 Solitary pulmonary nodule: Secondary | ICD-10-CM | POA: Diagnosis present

## 2023-07-08 DIAGNOSIS — F17210 Nicotine dependence, cigarettes, uncomplicated: Secondary | ICD-10-CM | POA: Diagnosis present

## 2023-07-23 ENCOUNTER — Ambulatory Visit (INDEPENDENT_AMBULATORY_CARE_PROVIDER_SITE_OTHER): Admitting: Nurse Practitioner

## 2023-07-23 ENCOUNTER — Encounter: Payer: Self-pay | Admitting: Nurse Practitioner

## 2023-07-23 VITALS — BP 122/78 | HR 88 | Temp 98.9°F | Ht 61.0 in | Wt 123.0 lb

## 2023-07-23 DIAGNOSIS — F17200 Nicotine dependence, unspecified, uncomplicated: Secondary | ICD-10-CM | POA: Diagnosis not present

## 2023-07-23 DIAGNOSIS — R911 Solitary pulmonary nodule: Secondary | ICD-10-CM | POA: Diagnosis not present

## 2023-07-23 NOTE — Progress Notes (Unsigned)
 @Patient  ID: Jillian Ward, female    DOB: [birth year], 58 y.o.   MRN: [id number]  Chief Complaint  Patient presents with   Follow-up    Doing well.  Review CT lung scan from 07/08/23/    Referring provider: Myrene Buddy, *  HPI: 58 year old female, active smoker followed for lung nodules. She is a patient of Dr. Doreene Adas and last seen in office 01/06/2023. Past medical history significant for cervical radiculopathy, HTN, anxiety, depression, rheumatoid arthritis on methotrexate and Humira.   TEST/EVENTS:  05/13/2022 PET: 1.5 cm pleural based nodule LUL, mild hypermetabolism.  06/2022 bronchoscopy >> cytology without malignant cells  12/24/2022 LDCT chest: emphysema. Chronic scarring in RLL. LUL pleural based nodule, decreased and now 12.5 mm, previously 14.2 mm. Now more triangular in shape. Additional nodules stable. Lung RADS 3  01/06/2023: OV with Dr. Aundria Rud. Initial LDCT chest 04/2022 with LUL pleural based nodule. Underwent PET/CT on 05/13/2022 that was mildly PET avid. Subsequently underwent robotic assisted navigational bronchoscopy 06/11/2022 without malignant cells. Repeat recent CT chest shows decreased size of LUL nodule. Recommend continued LDCT for lung cancer screening with attention to the LUL nodule.    07/23/2023: Today - follow up Patient presents today for follow up to review CT results. Unfortunately final read is not back from radiology. Reviewed scan with Dr. Jayme Cloud. Agreed that the PET avid left nodule has continued to decrease in size.  She has been doing well since her last visit. No hemoptysis, weight loss, anorexia, night sweats. No significant breathing issues. She does continue to smoke.   Allergies  Allergen Reactions   Metoclopramide Hives   Penicillins Hives   Iodinated Contrast Media Hives and Rash    Immunization History  Administered Date(s) Administered   Influenza Inj Mdck Quad Pf 02/17/2019, 01/12/2021   Influenza, Seasonal, Injecte,  Preservative Fre 01/10/2010, 02/25/2013, 01/03/2014   Influenza,inj,Quad PF,6+ Mos 01/09/2015, 02/26/2016, 01/01/2017, 01/05/2018, 12/14/2019, 12/18/2021   Moderna Sars-Covid-2 Vaccination 08/20/2019, 09/17/2019   Pneumococcal Conjugate-13 02/25/2013   Pneumococcal Polysaccharide-23 04/28/2013, 04/16/2021   Tdap 04/16/2021   Zoster Recombinant(Shingrix) 09/15/2017, 11/14/2017    Past Medical History:  Diagnosis Date   Anxiety    Arthritis    rheumatoid   Depression    Dyspnea    Hypertension    Nodule of right lung    Rib fracture     Tobacco History: Social History   Tobacco Use  Smoking Status Heavy Smoker   Current packs/day: 1.00   Average packs/day: 1 pack/day for 42.0 years (42.0 ttl pk-yrs)   Types: Cigarettes  Smokeless Tobacco Never  Tobacco Comments   15 cigarettes daily-06/19/2022   Ready to quit: Not Answered Counseling given: Not Answered Tobacco comments: 15 cigarettes daily-06/19/2022   Outpatient Medications Prior to Visit  Medication Sig Dispense Refill   acetaminophen (TYLENOL) 325 MG tablet Take 2 tablets (650 mg total) by mouth every 4 (four) hours as needed for mild pain ((score 1 to 3) or temp > 100.5).     Adalimumab 40 MG/0.8ML PSKT Inject 40 mg into the skin See admin instructions. Every 7 to 10 days     buPROPion (WELLBUTRIN XL) 300 MG 24 hr tablet Take 300 mg by mouth daily.     cholecalciferol (VITAMIN D3) 25 MCG (1000 UNIT) tablet Take 1,000 Units by mouth daily.     gabapentin (NEURONTIN) 100 MG capsule Take 100 mg by mouth at bedtime.     Methotrexate, PF, 10 MG/0.2ML SOAJ Inject 10 mg  into the skin every Monday.     pregabalin (LYRICA) 150 MG capsule Take 150 mg by mouth 2 (two) times daily.     venlafaxine XR (EFFEXOR-XR) 150 MG 24 hr capsule Take 150 mg by mouth daily with breakfast.     venlafaxine XR (EFFEXOR-XR) 75 MG 24 hr capsule Take 75 mg by mouth daily with breakfast.     No facility-administered medications prior to visit.      Review of Systems:   Constitutional: No weight loss or gain, night sweats, fevers, chills, fatigue, or lassitude. HEENT: No headaches, difficulty swallowing, tooth/dental problems, or sore throat. No sneezing, itching, ear ache, nasal congestion, or post nasal drip CV:  No chest pain, orthopnea, PND, swelling in lower extremities, anasarca, dizziness, palpitations, syncope Resp: No shortness of breath with exertion or at rest. No excess mucus or change in color of mucus. No productive or non-productive. No hemoptysis. No wheezing.  No chest wall deformity GI:  No heartburn, indigestion Neuro: No dizziness or lightheadedness.  Psych: No depression or anxiety. Mood stable.     Physical Exam:  BP 122/78 (BP Location: Right Arm, Patient Position: Sitting, Cuff Size: Normal)   Pulse 88   Temp 98.9 F (37.2 C) (Oral)   Ht 5\' 1"  (1.549 m)   Wt 123 lb (55.8 kg)   SpO2 96%   BMI 23.24 kg/m   GEN: Pleasant, interactive, well-appearing; in no acute distress HEENT:  Normocephalic and atraumatic. PERRLA. Sclera white. Nasal turbinates pink, moist and patent bilaterally. No rhinorrhea present. Oropharynx pink and moist, without exudate or edema. No lesions, ulcerations, or postnasal drip.  NECK:  Supple w/ fair ROM. No lymphadenopathy.   CV: RRR, no m/r/g, no peripheral edema. Pulses intact, +2 bilaterally. No cyanosis, pallor or clubbing. PULMONARY:  Unlabored, regular breathing. Clear bilaterally A&P w/o wheezes/rales/rhonchi. No accessory muscle use.  GI: BS present and normoactive. Soft, non-tender to palpation. No organomegaly or masses detected.  MSK: No erythema, warmth or tenderness. Cap refil <2 sec all extrem.  Neuro: A/Ox3. No focal deficits noted.   Skin: Warm, no lesions or rashe Psych: Normal affect and behavior. Judgement and thought content appropriate.     Lab Results:  CBC    Component Value Date/Time   WBC 15.4 (H) 11/10/2020 0833   RBC 4.93 11/10/2020 0833    HGB 15.1 (H) 11/10/2020 0833   HCT 42.7 11/10/2020 0833   PLT 350 11/10/2020 0833   MCV 86.6 11/10/2020 0833   MCH 30.6 11/10/2020 0833   MCHC 35.4 11/10/2020 0833   RDW 12.0 11/10/2020 0833    BMET    Component Value Date/Time   NA 134 (L) 11/10/2020 0833   K 3.9 11/10/2020 0833   CL 98 11/10/2020 0833   CO2 23 11/10/2020 0833   GLUCOSE 111 (H) 11/10/2020 0833   BUN 16 11/10/2020 0833   CREATININE 0.71 11/10/2020 0833   CALCIUM 9.6 11/10/2020 0833   GFRNONAA >60 11/10/2020 0833    BNP No results found for: "BNP"   Imaging:  No results found.  Administration History     None           No data to display          No results found for: "NITRICOXIDE"      Assessment & Plan:   Pulmonary nodule PET avid LUL pleural based nodule with cytology negative for malignancy in the past. Ongoing monitoring reveals continued decrease in size of the nodule. Given her significant  smoking history and the size, will continue to monitor closely. Plan for repeat CT chest in 6 months. If it has continued to decrease or resolved, could refer her back to the lung cancer screening program at this time. Reviewed with Dr. Viva Grise as Dr. Darnelle Elders out of office, who agreed. Will also await radiologist's final read and notify of any changes.   Patient Instructions  The nodule in your left lung continues to decrease in size, which is good news. We will continue to monitor and plan to repeat in 6 months as long as radiology agrees. I am still waiting on the final read back so will call you with any changes.  Follow up in 6 months after repeat CT chest with Dr. Darnelle Elders. If symptoms worsen, please contact office for sooner follow up or seek emergency care.    Smoker Smoking cessation advised.    Advised if symptoms do not improve or worsen, to please contact office for sooner follow up or seek emergency care.   I spent 31 minutes of dedicated to the care of this patient on the date of  this encounter to include pre-visit review of records, face-to-face time with the patient discussing conditions above, post visit ordering of testing, clinical documentation with the electronic health record, making appropriate referrals as documented, and communicating necessary findings to members of the patients care team.  Roetta Clarke, NP 07/24/2023  Pt aware and understands NP's role.

## 2023-07-23 NOTE — Patient Instructions (Signed)
 The nodule in your left lung continues to decrease in size, which is good news. We will continue to monitor and plan to repeat in 6 months as long as radiology agrees. I am still waiting on the final read back so will call you with any changes.  Follow up in 6 months after repeat CT chest with Dr. Darnelle Elders. If symptoms worsen, please contact office for sooner follow up or seek emergency care.

## 2023-07-24 ENCOUNTER — Encounter: Payer: Self-pay | Admitting: Nurse Practitioner

## 2023-07-24 DIAGNOSIS — F17200 Nicotine dependence, unspecified, uncomplicated: Secondary | ICD-10-CM | POA: Insufficient documentation

## 2023-07-24 NOTE — Assessment & Plan Note (Signed)
 PET avid LUL pleural based nodule with cytology negative for malignancy in the past. Ongoing monitoring reveals continued decrease in size of the nodule. Given her significant smoking history and the size, will continue to monitor closely. Plan for repeat CT chest in 6 months. If it has continued to decrease or resolved, could refer her back to the lung cancer screening program at this time. Reviewed with Dr. Viva Grise as Dr. Darnelle Elders out of office, who agreed. Will also await radiologist's final read and notify of any changes.   Patient Instructions  The nodule in your left lung continues to decrease in size, which is good news. We will continue to monitor and plan to repeat in 6 months as long as radiology agrees. I am still waiting on the final read back so will call you with any changes.  Follow up in 6 months after repeat CT chest with Dr. Darnelle Elders. If symptoms worsen, please contact office for sooner follow up or seek emergency care.

## 2023-07-24 NOTE — Assessment & Plan Note (Signed)
 Smoking cessation advised.

## 2023-08-06 ENCOUNTER — Encounter: Payer: Self-pay | Admitting: Student in an Organized Health Care Education/Training Program

## 2023-08-06 DIAGNOSIS — F17200 Nicotine dependence, unspecified, uncomplicated: Secondary | ICD-10-CM

## 2024-01-21 ENCOUNTER — Ambulatory Visit: Admission: RE | Admit: 2024-01-21

## 2024-02-16 ENCOUNTER — Encounter: Payer: Self-pay | Admitting: *Deleted

## 2024-02-16 ENCOUNTER — Other Ambulatory Visit: Payer: Self-pay | Admitting: *Deleted

## 2024-02-16 DIAGNOSIS — F17210 Nicotine dependence, cigarettes, uncomplicated: Secondary | ICD-10-CM

## 2024-02-16 DIAGNOSIS — Z122 Encounter for screening for malignant neoplasm of respiratory organs: Secondary | ICD-10-CM

## 2024-02-16 DIAGNOSIS — Z87891 Personal history of nicotine dependence: Secondary | ICD-10-CM

## 2024-02-17 ENCOUNTER — Other Ambulatory Visit: Payer: Self-pay | Admitting: Nurse Practitioner

## 2024-02-17 DIAGNOSIS — Z1231 Encounter for screening mammogram for malignant neoplasm of breast: Secondary | ICD-10-CM

## 2024-04-05 ENCOUNTER — Ambulatory Visit

## 2024-04-28 ENCOUNTER — Ambulatory Visit
Admission: RE | Admit: 2024-04-28 | Discharge: 2024-04-28 | Disposition: A | Discharge status: Home / Self Care | Source: Ambulatory Visit | Attending: Nurse Practitioner | Admitting: Nurse Practitioner

## 2024-04-28 DIAGNOSIS — Z1231 Encounter for screening mammogram for malignant neoplasm of breast: Secondary | ICD-10-CM | POA: Diagnosis present

## 2024-07-07 ENCOUNTER — Ambulatory Visit
